# Patient Record
Sex: Male | Born: 1945 | Race: White | Hispanic: No | Smoking: Former smoker
Health system: Southern US, Community
[De-identification: ages and names within clinical notes are randomized; demographics above are authoritative.]

## PROBLEM LIST (undated history)

## (undated) DIAGNOSIS — I2510 Atherosclerotic heart disease of native coronary artery without angina pectoris: Principal | ICD-10-CM

## (undated) DIAGNOSIS — E785 Hyperlipidemia, unspecified: Secondary | ICD-10-CM

## (undated) DIAGNOSIS — I10 Essential (primary) hypertension: Secondary | ICD-10-CM

## (undated) HISTORY — DX: Atherosclerotic heart disease of native coronary artery without angina pectoris: I25.10

## (undated) HISTORY — PX: HERNIA REPAIR: SHX51

## (undated) HISTORY — DX: Hyperlipidemia, unspecified: E78.5

## (undated) HISTORY — DX: Essential (primary) hypertension: I10

---

## 2004-01-14 ENCOUNTER — Inpatient Hospital Stay (HOSPITAL_COMMUNITY): Admission: EM | Admit: 2004-01-14 | Discharge: 2004-01-20 | Payer: Self-pay | Admitting: *Deleted

## 2004-01-15 HISTORY — PX: CARDIAC CATHETERIZATION: SHX172

## 2004-01-16 HISTORY — PX: CORONARY ARTERY BYPASS GRAFT: SHX141

## 2004-01-29 ENCOUNTER — Encounter: Admission: RE | Admit: 2004-01-29 | Discharge: 2004-01-29 | Payer: Self-pay | Admitting: Surgery

## 2004-02-11 ENCOUNTER — Encounter (HOSPITAL_COMMUNITY): Admission: RE | Admit: 2004-02-11 | Discharge: 2004-05-11 | Payer: Self-pay | Admitting: *Deleted

## 2010-01-16 HISTORY — PX: NM MYOCAR PERF WALL MOTION: HXRAD629

## 2013-02-09 ENCOUNTER — Other Ambulatory Visit: Payer: Self-pay

## 2013-02-09 MED ORDER — BENAZEPRIL-HYDROCHLOROTHIAZIDE 20-12.5 MG PO TABS: 1.0000 | ORAL_TABLET | Freq: Every day | ORAL | Status: DC

## 2013-02-09 NOTE — Telephone Encounter (Signed)
Rx was sent to pharmacy electronically. 

## 2013-02-14 ENCOUNTER — Other Ambulatory Visit: Payer: Self-pay | Admitting: Cardiovascular Disease

## 2013-02-14 NOTE — Telephone Encounter (Signed)
Rx was sent to pharmacy electronically. 

## 2013-06-15 ENCOUNTER — Other Ambulatory Visit: Payer: Self-pay | Admitting: *Deleted

## 2013-06-15 MED ORDER — METOPROLOL SUCCINATE ER 25 MG PO TB24: 25.0000 mg | ORAL_TABLET | Freq: Every day | ORAL | Status: DC

## 2013-06-15 NOTE — Telephone Encounter (Signed)
Rx was sent to pharmacy electronically. 

## 2013-06-22 ENCOUNTER — Other Ambulatory Visit: Payer: Self-pay | Admitting: Cardiovascular Disease

## 2013-06-22 LAB — COMPREHENSIVE METABOLIC PANEL
BUN: 10 mg/dL (ref 6–23)
Calcium: 9.5 mg/dL (ref 8.4–10.5)
Creat: 1.01 mg/dL (ref 0.50–1.35)
Potassium: 4.8 mEq/L (ref 3.5–5.3)
Sodium: 137 mEq/L (ref 135–145)
Total Bilirubin: 0.6 mg/dL (ref 0.3–1.2)

## 2013-06-22 LAB — LIPID PANEL
HDL: 34 mg/dL — ABNORMAL LOW (ref >39)
Total CHOL/HDL Ratio: 3.8 Ratio
VLDL: 19 mg/dL (ref 0–40)

## 2013-06-26 ENCOUNTER — Telehealth: Payer: Self-pay | Admitting: *Deleted

## 2013-06-26 NOTE — Telephone Encounter (Signed)
LM with lab results

## 2013-06-26 NOTE — Telephone Encounter (Signed)
Message copied by Vita Barley on Mon Jun 26, 2013  2:57 PM ------      Message from: [place], [place]      Created: Thu Jun 22, 2013  6:09 PM       Labs look great ------

## 2013-06-30 ENCOUNTER — Ambulatory Visit (INDEPENDENT_AMBULATORY_CARE_PROVIDER_SITE_OTHER): Payer: Medicare Other | Admitting: Cardiovascular Disease

## 2013-06-30 ENCOUNTER — Encounter: Payer: Self-pay | Admitting: Cardiovascular Disease

## 2013-06-30 VITALS — BP 141/83 | HR 50 | Resp 16 | Ht 70.0 in | Wt 208.9 lb

## 2013-06-30 DIAGNOSIS — I2510 Atherosclerotic heart disease of native coronary artery without angina pectoris: Secondary | ICD-10-CM

## 2013-06-30 DIAGNOSIS — I498 Other specified cardiac arrhythmias: Secondary | ICD-10-CM

## 2013-06-30 DIAGNOSIS — Z23 Encounter for immunization: Secondary | ICD-10-CM

## 2013-06-30 DIAGNOSIS — R001 Bradycardia, unspecified: Secondary | ICD-10-CM

## 2013-06-30 DIAGNOSIS — E785 Hyperlipidemia, unspecified: Secondary | ICD-10-CM

## 2013-06-30 DIAGNOSIS — I10 Essential (primary) hypertension: Secondary | ICD-10-CM

## 2013-06-30 NOTE — Patient Instructions (Signed)
Your physician recommends that you schedule a follow-up appointment in: ONE YEAR  Have lab work done in one year prior to your visit (Lipid profile & CMET).

## 2013-07-04 ENCOUNTER — Encounter: Payer: Self-pay | Admitting: Cardiovascular Disease

## 2013-07-04 DIAGNOSIS — I10 Essential (primary) hypertension: Secondary | ICD-10-CM | POA: Insufficient documentation

## 2013-07-04 DIAGNOSIS — R001 Bradycardia, unspecified: Secondary | ICD-10-CM | POA: Insufficient documentation

## 2013-07-04 DIAGNOSIS — E785 Hyperlipidemia, unspecified: Secondary | ICD-10-CM

## 2013-07-04 DIAGNOSIS — I2510 Atherosclerotic heart disease of native coronary artery without angina pectoris: Secondary | ICD-10-CM

## 2013-07-04 HISTORY — DX: Essential (primary) hypertension: I10

## 2013-07-04 HISTORY — DX: Hyperlipidemia, unspecified: E78.5

## 2013-07-04 HISTORY — DX: Atherosclerotic heart disease of native coronary artery without angina pectoris: I25.10

## 2013-07-04 NOTE — Assessment & Plan Note (Signed)
Borderline high on arrival, when I rechecked his blood pressure it was only 135/81 mm Hg. No changes are made to his antihypertensive medications.

## 2013-07-04 NOTE — Assessment & Plan Note (Signed)
Is is a lifelong characteristic for him and as long as he is asymptomatic no changes are necessary in his medications. He should continue taking the beta blocker.

## 2013-07-04 NOTE — Progress Notes (Signed)
Patient ID: [name], male   DOB: 1946-01-16, 67 y.o.   MRN: [id number]      Reason for office visit Followup CAD  Bohdan feels great. He has no complaints of chest pain, shortness of breath, dizziness, palpitations, syncope, edema or focal neurological problems. His son is recently divorced and is now living with him, which is disturbed his daily routine to some degree. He continues to walk frequently and occasionally lifts weights. He is enjoying spending more time with his grandchildren. He has no physical complaints.   Allergies  Allergen Reactions  . Equal [Aspartame] Hives    Current Outpatient Prescriptions  Medication Sig Dispense Refill  . aspirin 81 MG tablet Take 81 mg by mouth daily.      . benazepril-hydrochlorthiazide (LOTENSIN HCT) 20-12.5 MG per tablet Take 1 tablet by mouth daily.  30 tablet  5  . cycloSPORINE (RESTASIS) 0.05 % ophthalmic emulsion Place 1 drop into both eyes 2 (two) times daily.      . metoprolol succinate (TOPROL-XL) 25 MG 24 hr tablet Take 1 tablet (25 mg total) by mouth daily.  30 tablet  1  . NEXIUM 40 MG capsule take 1 capsule by mouth twice a day  60 capsule  4  . omega-3 acid ethyl esters (LOVAZA) 1 G capsule Take 1 g by mouth 3 (three) times daily.      . simvastatin (ZOCOR) 40 MG tablet Take 40 mg by mouth daily.       No current facility-administered medications for this visit.    Past Medical History  Diagnosis Date  . 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 Laneta Simmers  . Hyperlipidemia 07/04/2013  . HTN (hypertension) 07/04/2013    No past surgical history on file.  No family history on file.  History   Social History  . Marital Status: Divorced    Spouse Name: N/A    Number of Children: N/A  . Years of Education: N/A   Occupational History  . Not on file.   Social History Main Topics  . Smoking status: Former Smoker    Quit date:  08/16/2005  . Smokeless tobacco: Not on file  . Alcohol Use: Yes     Comment: rarely  . Drug Use: No  . Sexual Activity: Not on file   Other Topics Concern  . Not on file   Social History Narrative  . No narrative on file    Review of systems: The patient specifically denies any chest pain at rest exertion, dyspnea at rest or with exertion, orthopnea, paroxysmal nocturnal dyspnea, syncope, palpitations, focal neurological deficits, intermittent claudication, lower extremity edema, unexplained weight gain, cough, hemoptysis or wheezing.   PHYSICAL EXAM BP 141/83  Pulse 50  Resp 16  Ht 5\' 10"  (1.778 m)  Wt 208 lb 14.4 oz (94.756 kg)  BMI 29.97 kg/m2  General: Alert, oriented x3, no distress Head: no evidence of trauma, PERRL, EOMI, no exophtalmos or lid lag, no myxedema, no xanthelasma; normal ears, nose and oropharynx Neck: normal jugular venous pulsations and no hepatojugular reflux; brisk carotid pulses without delay and no carotid bruits Chest: clear to auscultation, no signs of consolidation by percussion or palpation, normal fremitus, symmetrical and full respiratory excursions Cardiovascular: normal position and quality of the apical impulse, regular rhythm, normal first and second heart sounds, no murmurs, rubs or gallops Abdomen: no tenderness or distention, no  masses by palpation, no abnormal pulsatility or arterial bruits, normal bowel sounds, no hepatosplenomegaly Extremities: no clubbing, cyanosis or edema; 2+ radial, ulnar and brachial pulses bilaterally; 2+ right femoral, posterior tibial and dorsalis pedis pulses; 2+ left femoral, posterior tibial and dorsalis pedis pulses; no subclavian or femoral bruits Neurological: grossly nonfocal   EKG: Sinus bradycardia, nonspecific bifid T waves in the lateral precordial leads, not much change from previous tracings  Lipid Panel     Component Value Date/Time   CHOL 128 06/22/2013 1027   TRIG 96 06/22/2013 1027   HDL 34*  06/22/2013 1027   CHOLHDL 3.8 06/22/2013 1027   VLDL 19 06/22/2013 1027   LDLCALC 75 06/22/2013 1027    BMET    Component Value Date/Time   NA 137 06/22/2013 1027   K 4.8 06/22/2013 1027   CL 103 06/22/2013 1027   CO2 27 06/22/2013 1027   GLUCOSE 99 06/22/2013 1027   BUN 10 06/22/2013 1027   CREATININE 1.01 06/22/2013 1027   CALCIUM 9.5 06/22/2013 1027     ASSESSMENT AND PLAN CAD s/p CABG 2005 Almost 10 years since bypass surgery he remains completely asymptomatic despite a relatively active lifestyle. He has not managed to make a huge dent in his overweight status but is just under the obese margin. I have encouraged him to avoid simple carbohydrates, starches with high glycemic index and saturated fat and try to increase his intake of protein in "healthy fat". He is encouraged to keep walking and exercising. Otherwise his coronary risk factors appear to be relatively well addressed.  HTN (hypertension) Borderline high on arrival, when I rechecked his blood pressure it was only 135/81 mm Hg. No changes are made to his antihypertensive medications.  Hyperlipidemia With the exception of a slightly low HDL cholesterol, all his other lipid parameters are within the desirable range. Increasing physical activity and avoiding carbohydrates might help his HDL rise a little bit.  Sinus bradycardia Is is a lifelong characteristic for him and as long as he is asymptomatic no changes are necessary in his medications. He should continue taking the beta blocker.   at [facility]: Lambert Rhonda W request we gave him a flu shot today Orders Placed This Encounter  Procedures  . Flu Vaccine QUAD 36+ mos IM  . EKG 12-Lead   Meds ordered this encounter  Medications  . omega-3 acid ethyl esters (LOVAZA) 1 G capsule    Sig: Take 1 g by mouth 3 (three) times daily.  . simvastatin (ZOCOR) 40 MG tablet    Sig: Take 40 mg by mouth daily.  Marland Kitchen aspirin 81 MG tablet    Sig: Take 81 mg by mouth daily.  . cycloSPORINE (RESTASIS) 0.05  % ophthalmic emulsion    Sig: Place 1 drop into both eyes 2 (two) times daily.    Junious Silk, MD, [facility] [phone] office [phone] pager

## 2013-07-04 NOTE — Assessment & Plan Note (Signed)
Almost 10 years since bypass surgery he remains completely asymptomatic despite a relatively active lifestyle. He has not managed to make a huge dent in his overweight status but is just under the obese margin. I have encouraged him to avoid simple carbohydrates, starches with high glycemic index and saturated fat and try to increase his intake of protein in "healthy fat". He is encouraged to keep walking and exercising. Otherwise his coronary risk factors appear to be relatively well addressed.

## 2013-07-04 NOTE — Assessment & Plan Note (Signed)
With the exception of a slightly low HDL cholesterol, all his other lipid parameters are within the desirable range. Increasing physical activity and avoiding carbohydrates might help his HDL rise a little bit.

## 2013-07-10 ENCOUNTER — Other Ambulatory Visit: Payer: Self-pay | Admitting: Cardiovascular Disease

## 2013-07-10 NOTE — Telephone Encounter (Signed)
Rx was sent to pharmacy electronically. 

## 2013-08-14 ENCOUNTER — Other Ambulatory Visit: Payer: Self-pay | Admitting: Cardiovascular Disease

## 2013-08-15 NOTE — Telephone Encounter (Signed)
Rx was sent to pharmacy electronically. 

## 2013-08-23 ENCOUNTER — Other Ambulatory Visit: Payer: Self-pay | Admitting: Cardiovascular Disease

## 2013-08-23 NOTE — Telephone Encounter (Signed)
Rx was sent to pharmacy electronically. 

## 2014-01-16 ENCOUNTER — Other Ambulatory Visit: Payer: Self-pay | Admitting: Cardiovascular Disease

## 2014-01-16 NOTE — Telephone Encounter (Signed)
Rx was sent to pharmacy electronically. 

## 2014-07-16 ENCOUNTER — Other Ambulatory Visit: Payer: Self-pay | Admitting: Cardiovascular Disease

## 2014-07-17 NOTE — Telephone Encounter (Signed)
Rx was sent to pharmacy electronically. 

## 2014-08-12 ENCOUNTER — Other Ambulatory Visit: Payer: Self-pay | Admitting: Cardiovascular Disease

## 2014-08-13 NOTE — Telephone Encounter (Signed)
Rx refill sent to patient pharmacy   

## 2014-08-14 ENCOUNTER — Other Ambulatory Visit: Payer: Self-pay | Admitting: Cardiovascular Disease

## 2014-08-15 NOTE — Telephone Encounter (Signed)
Rx(s) sent to pharmacy electronically. OV 10/03/14

## 2014-08-19 ENCOUNTER — Other Ambulatory Visit: Payer: Self-pay | Admitting: Cardiovascular Disease

## 2014-08-20 NOTE — Telephone Encounter (Signed)
Rx(s) sent to pharmacy electronically. OV 10/03/14

## 2014-10-03 ENCOUNTER — Ambulatory Visit (INDEPENDENT_AMBULATORY_CARE_PROVIDER_SITE_OTHER): Payer: Medicare Other | Admitting: Cardiovascular Disease

## 2014-10-03 ENCOUNTER — Encounter: Payer: Self-pay | Admitting: Cardiovascular Disease

## 2014-10-03 VITALS — BP 128/64 | HR 50 | Ht 70.0 in | Wt 201.3 lb

## 2014-10-03 DIAGNOSIS — I2510 Atherosclerotic heart disease of native coronary artery without angina pectoris: Secondary | ICD-10-CM

## 2014-10-03 DIAGNOSIS — Z79899 Other long term (current) drug therapy: Secondary | ICD-10-CM

## 2014-10-03 DIAGNOSIS — R001 Bradycardia, unspecified: Secondary | ICD-10-CM

## 2014-10-03 DIAGNOSIS — I10 Essential (primary) hypertension: Secondary | ICD-10-CM

## 2014-10-03 DIAGNOSIS — E785 Hyperlipidemia, unspecified: Secondary | ICD-10-CM

## 2014-10-03 NOTE — Patient Instructions (Signed)
Dr.Croitoru  wants you to follow-up in: 1 year You will receive a reminder letter in the mail two months in advance. If you don't receive a letter, please call our office to schedule the follow-up appointment.  Your physician recommends that you return for lab work FASTING  Your physician has requested that you have an exercise tolerance test. For further information please visit [email or web address]. Please also follow instruction sheet, as given.

## 2014-10-03 NOTE — Progress Notes (Signed)
Patient ID: [name] SEEPERSAD, male   DOB: 03/10/1946, 69 y.o.   MRN: [id number]     Reason for office visit CAD, hyperlipidemia, hypertension  Mr. [name] is now over 10 years s/p CABG for multivessel CAD. He remains asymptomatic. He walks daily in his pain more attention to his diet. He has lost roughly 10 pounds in the last couple of years. He remains moderate 3 overweight. He has long-standing mild and asymptomatic sinus bradycardia. He has treated hypertension and hyperlipidemia. He does not smoke. Pre-bypass angina manifested as left arm pain   Allergies  Allergen Reactions  . Equal [Aspartame] Hives  . Wellbutrin [Bupropion]     Current Outpatient Prescriptions  Medication Sig Dispense Refill  . aspirin 325 MG tablet Take 325 mg by mouth daily.    . benazepril-hydrochlorthiazide (LOTENSIN HCT) 20-12.5 MG per tablet TAKE 1 TABLET BY MOUTH EVERY DAY 30 tablet 1  . cycloSPORINE (RESTASIS) 0.05 % ophthalmic emulsion Place 1 drop into both eyes 2 (two) times daily.    . metoprolol succinate (TOPROL-XL) 25 MG 24 hr tablet take 1 tablet by mouth daily 30 tablet 1  . NEXIUM 40 MG capsule take 1 capsule by mouth twice a day 60 capsule 4  . omega-3 acid ethyl esters (LOVAZA) 1 G capsule Take 1 g by mouth 3 (three) times daily.    . simvastatin (ZOCOR) 40 MG tablet take 1 tablet by mouth at bedtime 30 tablet 1   No current facility-administered medications for this visit.    Past Medical History  Diagnosis Date  . 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 Laneta Simmers  . Hyperlipidemia 07/04/2013  . HTN (hypertension) 07/04/2013    Past Surgical History  Procedure Laterality Date  . Nm myocar perf wall motion  01/16/2010    EF 65%,Mets 10  . Cardiac catheterization  01/15/2004  . Coronary artery bypass graft  01/16/2004    CABG's x4    No family history on file.  History   Social History  . Marital Status:  Divorced    Spouse Name: N/A  . Number of Children: N/A  . Years of Education: N/A   Occupational History  . Not on file.   Social History Main Topics  . Smoking status: Former Smoker    Quit date: 08/16/2005  . Smokeless tobacco: Not on file  . Alcohol Use: Yes     Comment: rarely  . Drug Use: No  . Sexual Activity: Not on file   Other Topics Concern  . Not on file   Social History Narrative    Review of systems: The patient specifically denies any chest pain at rest or with exertion, dyspnea at rest or with exertion, orthopnea, paroxysmal nocturnal dyspnea, syncope, palpitations, focal neurological deficits, intermittent claudication, lower extremity edema, unexplained weight gain, cough, hemoptysis or wheezing.  The patient also denies abdominal pain, nausea, vomiting, dysphagia, diarrhea, constipation, polyuria, polydipsia, dysuria, hematuria, frequency, urgency, abnormal bleeding or bruising, fever, chills, unexpected weight changes, mood swings, change in skin or hair texture, change in voice quality, auditory or visual problems, allergic reactions or rashes, new musculoskeletal complaints other than usual "aches and pains".   PHYSICAL EXAM BP 128/64 mmHg  Pulse 50  Ht 5\' 10"  (1.778 m)  Wt 91.309 kg (201 lb 4.8 oz)  BMI 28.88 kg/m2  General: Alert, oriented x3, no distress Head: no evidence of trauma,  PERRL, EOMI, no exophtalmos or lid lag, no myxedema, no xanthelasma; normal ears, nose and oropharynx Neck: normal jugular venous pulsations and no hepatojugular reflux; brisk carotid pulses without delay and no carotid bruits Chest: clear to auscultation, no signs of consolidation by percussion or palpation, normal fremitus, symmetrical and full respiratory excursions, sternotomy scar Cardiovascular: normal position and quality of the apical impulse, regular rhythm, normal first and second heart sounds, no murmurs, rubs or gallops Abdomen: no tenderness or distention, no  masses by palpation, no abnormal pulsatility or arterial bruits, normal bowel sounds, no hepatosplenomegaly Extremities: no clubbing, cyanosis or edema; 2+ radial, ulnar and brachial pulses bilaterally; 2+ right femoral, posterior tibial and dorsalis pedis pulses; 2+ left femoral, posterior tibial and dorsalis pedis pulses; no subclavian or femoral bruits Neurological: grossly nonfocal   EKG: Sinus bradycardia, nonspecific T-wave flattening  Lipid Panel     Component Value Date/Time   CHOL 128 06/22/2013 1027   TRIG 96 06/22/2013 1027   HDL 34* 06/22/2013 1027   CHOLHDL 3.8 06/22/2013 1027   VLDL 19 06/22/2013 1027   LDLCALC 75 06/22/2013 1027    BMET    Component Value Date/Time   NA 137 06/22/2013 1027   K 4.8 06/22/2013 1027   CL 103 06/22/2013 1027   CO2 27 06/22/2013 1027   GLUCOSE 99 06/22/2013 1027   BUN 10 06/22/2013 1027   CREATININE 1.01 06/22/2013 1027   CALCIUM 9.5 06/22/2013 1027     ASSESSMENT AND PLAN  [name] is doing very well 10 years after bypass surgery. I recommended a plain treadmill ECG stress test since it has been about 5 years since his last functional study. All his coronary risk factors seem to be well addressed with the exception of the fact that he remains mildly overweight. He is working on losing pounds gradually. Also time to repeat a lipid profile and routine labs. Will follow-up on a yearly basis.  Orders Placed This Encounter  Procedures  . EKG 12-Lead   Meds ordered this encounter  Medications  . aspirin 325 MG tablet    Sig: Take 325 mg by mouth daily.    Junious Silk, MD, [facility] [phone] office [phone] pager

## 2014-10-10 ENCOUNTER — Other Ambulatory Visit: Payer: Self-pay | Admitting: Cardiovascular Disease

## 2014-10-11 NOTE — Telephone Encounter (Signed)
Rx(s) sent to pharmacy electronically.  

## 2014-10-15 ENCOUNTER — Other Ambulatory Visit: Payer: Self-pay | Admitting: Cardiovascular Disease

## 2014-10-15 NOTE — Telephone Encounter (Signed)
Rx(s) sent to pharmacy electronically.  

## 2014-10-16 ENCOUNTER — Other Ambulatory Visit: Payer: Self-pay | Admitting: Cardiovascular Disease

## 2014-10-17 NOTE — Telephone Encounter (Signed)
Rx(s) sent to pharmacy electronically.  

## 2014-10-19 LAB — COMPREHENSIVE METABOLIC PANEL
ALK PHOS: 76 U/L (ref 39–117)
ALT: 19 U/L (ref 0–53)
AST: 22 U/L (ref 0–37)
Albumin: 4.3 g/dL (ref 3.5–5.2)
BILIRUBIN TOTAL: 0.8 mg/dL (ref 0.2–1.2)
BUN: 11 mg/dL (ref 6–23)
CHLORIDE: 101 meq/L (ref 96–112)
CO2: 27 meq/L (ref 19–32)
CREATININE: 0.97 mg/dL (ref 0.50–1.35)
Calcium: 9.3 mg/dL (ref 8.4–10.5)
GLUCOSE: 99 mg/dL (ref 70–99)
Potassium: 4.1 mEq/L (ref 3.5–5.3)
Sodium: 138 mEq/L (ref 135–145)
TOTAL PROTEIN: 7 g/dL (ref 6.0–8.3)

## 2014-10-19 LAB — LIPID PANEL
CHOL/HDL RATIO: 3 ratio
CHOLESTEROL: 128 mg/dL (ref 0–200)
HDL: 42 mg/dL (ref >=40)
LDL Cholesterol: 70 mg/dL (ref 0–99)
TRIGLYCERIDES: 79 mg/dL (ref <150)
VLDL: 16 mg/dL (ref 0–40)

## 2014-10-23 ENCOUNTER — Telehealth (HOSPITAL_COMMUNITY): Payer: Self-pay

## 2014-10-23 NOTE — Telephone Encounter (Signed)
Encounter complete. 

## 2014-10-24 ENCOUNTER — Telehealth (HOSPITAL_COMMUNITY): Payer: Self-pay

## 2014-10-24 NOTE — Telephone Encounter (Signed)
Encounter complete. 

## 2014-10-25 ENCOUNTER — Ambulatory Visit (HOSPITAL_COMMUNITY)
Admission: RE | Admit: 2014-10-25 | Discharge: 2014-10-25 | Disposition: A | Discharge status: Home / Self Care | Payer: Medicare Other | Source: Ambulatory Visit | Attending: Cardiology | Admitting: Cardiology

## 2014-10-25 DIAGNOSIS — I2510 Atherosclerotic heart disease of native coronary artery without angina pectoris: Secondary | ICD-10-CM | POA: Insufficient documentation

## 2014-10-25 NOTE — Procedures (Signed)
Exercise Treadmill Test   Test  Exercise Tolerance Test Ordering MD: Jerilynn Mages. Croitoru, MD  Interpreting MD:   Unique Test No: 1 Treadmill:  1  Indication for ETT: Follow-Up CAD  Contraindication to ETT: No   Stress Modality: exercise - treadmill  Cardiac Imaging Performed: non   Protocol: standard Bruce - maximal  Max BP:  204/110  Max MPHR (bpm):  154 85% MPR (bpm):  131  MPHR obtained (bpm):  137 % MPHR obtained:  88  Reached 85% MPHR (min:sec):  7:00 Total Exercise Time (min-sec):  8:00  Workload in METS:  10.10 Borg Scale:   Reason ETT Terminated:  dyspnea    ST Segment Analysis At Rest: normal ST segments - no evidence of significant ST depression With Exercise: significant ischemic ST depression  Other Information Arrhythmia:  No Angina during ETT:  absent (0) Quality of ETT:  diagnostic  ETT Interpretation:  abnormal - evidence of ST depression consistent with ischemia  Comments: 3-4 mm horizontal to downsloping ST segments inferiorly with T wave inversions Good exercise tolerance Marked hypertensive response to exercise No chest pain  Pixie Casino, MD, [facility] Attending Cardiologist [place]

## 2015-01-02 ENCOUNTER — Encounter: Payer: Self-pay | Admitting: *Deleted

## 2015-02-11 ENCOUNTER — Other Ambulatory Visit: Payer: Self-pay

## 2015-05-20 ENCOUNTER — Encounter: Payer: Self-pay | Admitting: Family Medicine

## 2015-10-06 ENCOUNTER — Other Ambulatory Visit: Payer: Self-pay | Admitting: Cardiovascular Disease

## 2015-10-07 NOTE — Telephone Encounter (Signed)
Rx(s) sent to pharmacy electronically.  

## 2015-10-10 ENCOUNTER — Other Ambulatory Visit: Payer: Self-pay | Admitting: Cardiovascular Disease

## 2015-10-10 NOTE — Telephone Encounter (Signed)
Rx(s) sent to pharmacy electronically.  

## 2015-10-18 ENCOUNTER — Other Ambulatory Visit: Payer: Self-pay | Admitting: Cardiovascular Disease

## 2015-11-04 ENCOUNTER — Other Ambulatory Visit: Payer: Self-pay | Admitting: Cardiovascular Disease

## 2015-11-04 NOTE — Telephone Encounter (Signed)
Rx(s) sent to pharmacy electronically.  

## 2015-11-11 ENCOUNTER — Other Ambulatory Visit: Payer: Self-pay | Admitting: Cardiovascular Disease

## 2015-11-11 NOTE — Telephone Encounter (Signed)
Rx refill sent to pharmacy. 

## 2015-11-18 ENCOUNTER — Other Ambulatory Visit: Payer: Self-pay | Admitting: Cardiovascular Disease

## 2015-11-19 NOTE — Telephone Encounter (Signed)
Rx(s) sent to pharmacy electronically.  

## 2015-12-10 ENCOUNTER — Other Ambulatory Visit: Payer: Self-pay | Admitting: Cardiovascular Disease

## 2015-12-10 NOTE — Telephone Encounter (Signed)
Rx(s) sent to pharmacy electronically.  

## 2015-12-17 ENCOUNTER — Other Ambulatory Visit: Payer: Self-pay | Admitting: Cardiovascular Disease

## 2015-12-17 NOTE — Telephone Encounter (Signed)
REFILL 

## 2015-12-18 ENCOUNTER — Ambulatory Visit (INDEPENDENT_AMBULATORY_CARE_PROVIDER_SITE_OTHER): Payer: Medicare Other | Admitting: Cardiovascular Disease

## 2015-12-18 ENCOUNTER — Encounter: Payer: Self-pay | Admitting: Cardiovascular Disease

## 2015-12-18 VITALS — BP 107/66 | HR 64 | Ht 70.0 in | Wt 182.0 lb

## 2015-12-18 DIAGNOSIS — Z79899 Other long term (current) drug therapy: Secondary | ICD-10-CM | POA: Diagnosis not present

## 2015-12-18 DIAGNOSIS — I10 Essential (primary) hypertension: Secondary | ICD-10-CM | POA: Diagnosis not present

## 2015-12-18 DIAGNOSIS — E785 Hyperlipidemia, unspecified: Secondary | ICD-10-CM | POA: Diagnosis not present

## 2015-12-18 DIAGNOSIS — I2510 Atherosclerotic heart disease of native coronary artery without angina pectoris: Secondary | ICD-10-CM | POA: Diagnosis not present

## 2015-12-18 MED ORDER — BENAZEPRIL-HYDROCHLOROTHIAZIDE 20-12.5 MG PO TABS: 0.5000 | ORAL_TABLET | Freq: Every day | ORAL | Status: DC

## 2015-12-18 NOTE — Progress Notes (Signed)
Patient ID: [name], male   DOB: 09/22/1945, 71 y.o.   MRN: [id number]    Cardiology Office Note    Date:  12/18/2015   ID:  [name] [birth year], MRN [id number]  PCP:  Lucilla Edin, MD  Cardiologist:   Thurmon Fair, MD   Chief Complaint  Patient presents with  . Follow-up    no Chest pain, no shortness of breath, no edema, no pain or cramping in legs, no lightheaded or dizziness    History of Present Illness:  [name] is a 70 y.o. male who underwent coronary bypass surgery for multivessel CAD 12 years ago. He has treated hyperlipidemia and hypertension. He feels great. In the last 3 years he has lost 28 pounds, 18 of those just in the last 12 months. He is simply watching his food intake more carefully. He continues to walk regularly. He denies complaints of angina pectoris (left arm pain before bypass surgery), dyspnea, edema, or claudication. He has not had syncope, dizziness, palpitations or lightheadedness. His blood pressure is substantially lower than it was in the past. He has long-standing erectile dysfunction. He has a "false positive" ECG treadmill response, but had a normal nuclear stress test in 2011. A year ago, his exercise capacity was identical to 2011 (8 minutes on the Bruce protocol).   Past Medical History  Diagnosis Date  . 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 Laneta Simmers  . Hyperlipidemia 07/04/2013  . HTN (hypertension) 07/04/2013    Past Surgical History  Procedure Laterality Date  . Nm myocar perf wall motion  01/16/2010    EF 65%,Mets 10  . Cardiac catheterization  01/15/2004  . Coronary artery bypass graft  01/16/2004    CABG's x4    Current Medications: Outpatient Prescriptions Prior to Visit  Medication Sig Dispense Refill  . aspirin 325 MG tablet Take 325 mg by mouth daily.    . cycloSPORINE (RESTASIS) 0.05 % ophthalmic emulsion Place 1 drop into both  eyes 2 (two) times daily.    . metoprolol succinate (TOPROL-XL) 25 MG 24 hr tablet Take 1 tablet (25 mg total) by mouth daily. KEEP OV. 30 tablet 0  . NEXIUM 40 MG capsule take 1 capsule by mouth twice a day 60 capsule 4  . omega-3 acid ethyl esters (LOVAZA) 1 G capsule Take 1 g by mouth 3 (three) times daily.    . simvastatin (ZOCOR) 40 MG tablet Take 1 tablet (40 mg total) by mouth at bedtime. KEEP OV. 30 tablet 0  . benazepril-hydrochlorthiazide (LOTENSIN HCT) 20-12.5 MG tablet TAKE 1 TABLET BY MOUTH EVERY DAY 30 tablet 0   No facility-administered medications prior to visit.     Allergies:   Equal and Wellbutrin   Social History   Social History  . Marital Status: Divorced    Spouse Name: N/A  . Number of Children: N/A  . Years of Education: N/A   Social History Main Topics  . Smoking status: Former Smoker    Quit date: 08/16/2005  . Smokeless tobacco: None  . Alcohol Use: Yes     Comment: rarely  . Drug Use: No  . Sexual Activity: Not Asked   Other Topics Concern  . None   Social History Narrative      ROS:   Please see the history of present illness.    ROS All other systems reviewed  and are negative.   PHYSICAL EXAM:   VS:  BP 107/66 mmHg  Pulse 64  Ht 5\' 10"  (1.778 m)  Wt 82.555 kg (182 lb)  BMI 26.11 kg/m2   GEN: Well nourished, well developed, in no acute distress HEENT: normal Neck: no JVD, carotid bruits, or masses Cardiac: RRR; no murmurs, rubs, or gallops,no edema  Respiratory:  clear to auscultation bilaterally, normal work of breathing GI: soft, nontender, nondistended, + BS MS: no deformity or atrophy Skin: warm and dry, no rash Neuro:  Alert and Oriented x 3, Strength and sensation are intact Psych: euthymic mood, full affect  Wt Readings from Last 3 Encounters:  12/18/15 82.555 kg (182 lb)  10/03/14 91.309 kg (201 lb 4.8 oz)  06/30/13 94.756 kg (208 lb 14.4 oz)      Studies/Labs Reviewed:   EKG:  EKG is ordered today.  The ekg  ordered today demonstrates Sinus rhythm with nonspecific generalized T-wave abnormalities  Recent Labs: No results found for requested labs within last 365 days.   Lipid Panel    Component Value Date/Time   CHOL 128 10/18/2014 1033   TRIG 79 10/18/2014 1033   HDL 42 10/18/2014 1033   CHOLHDL 3.0 10/18/2014 1033   VLDL 16 10/18/2014 1033   LDLCALC 70 10/18/2014 1033     ASSESSMENT:    1. Coronary artery disease involving native coronary artery of native heart without angina pectoris   2. Essential hypertension   3. Hyperlipidemia   4. Medication management      PLAN:  In order of problems listed above:  1. CAD: Asymptomatic, risk factors well addressed. Congratulated on weight loss. Very close to target weight range. 2. HTN: Blood pressure now approaching low normal range. We'll cut back the dose of benazepril-hydrochlorothiazide in half 3. HLP: Recheck lipid profile. Suspected will be even better than last year.    Medication Adjustments/Labs and Tests Ordered: Current medicines are reviewed at length with the patient today.  Concerns regarding medicines are outlined above.  Medication changes, Labs and Tests ordered today are listed in the Patient Instructions below. Patient Instructions  Medication Instructions: Dr Royann Shivers has recommended making the following medication changes: 1. DECREASE Benazepril-HCT - take 0.5 tablet daily  Labwork: Your physician recommends that you return for lab work at your earliest convenience - FASTING.  Testing/Procedures: NONE  Follow-up: Dr Royann Shivers recommends that you schedule a follow-up appointment in 1 year. You will receive a reminder letter in the mail two months in advance. If you don't receive a letter, please call our office to schedule the follow-up appointment.  If you need a refill on your cardiac medications before your next appointment, please call your pharmacy.     Joie Bimler, MD  12/18/2015 2:28 PM     [facility] [address] [place], [place], [place]  [address] Phone: [phone]; Fax: [phone]

## 2015-12-18 NOTE — Patient Instructions (Signed)
Medication Instructions: Dr Sallyanne Kuster has recommended making the following medication changes: 1. DECREASE Benazepril-HCT - take 0.5 tablet daily  Labwork: Your physician recommends that you return for lab work at your earliest [place].  Testing/Procedures: NONE  Follow-up: Dr Sallyanne Kuster recommends that you schedule a follow-up appointment in 1 year. You will receive a reminder letter in the mail two months in advance. If you don't receive a letter, please call our office to schedule the follow-up appointment.  If you need a refill on your cardiac medications before your next appointment, please call your pharmacy.

## 2015-12-30 NOTE — Addendum Note (Signed)
Addended by: Therisa Doyne on: 12/30/2015 04:27 PM   Modules accepted: Orders

## 2016-01-09 ENCOUNTER — Other Ambulatory Visit: Payer: Self-pay | Admitting: Cardiovascular Disease

## 2016-01-09 NOTE — Telephone Encounter (Signed)
Rx(s) sent to pharmacy electronically.  

## 2016-01-16 ENCOUNTER — Other Ambulatory Visit: Payer: Self-pay | Admitting: Cardiovascular Disease

## 2016-01-16 NOTE — Telephone Encounter (Signed)
Rx(s) sent to pharmacy electronically.  

## 2016-02-13 ENCOUNTER — Other Ambulatory Visit: Payer: Self-pay | Admitting: Cardiovascular Disease

## 2016-02-13 NOTE — Telephone Encounter (Signed)
Rx(s) sent to pharmacy electronically.  

## 2016-03-18 ENCOUNTER — Ambulatory Visit (INDEPENDENT_AMBULATORY_CARE_PROVIDER_SITE_OTHER): Payer: Medicare Other | Admitting: Emergency Medicine

## 2016-03-18 ENCOUNTER — Ambulatory Visit (INDEPENDENT_AMBULATORY_CARE_PROVIDER_SITE_OTHER): Payer: Medicare Other

## 2016-03-18 VITALS — BP 126/60 | HR 58 | Temp 98.1°F | Resp 18 | Ht 70.0 in | Wt 172.8 lb

## 2016-03-18 DIAGNOSIS — J0101 Acute recurrent maxillary sinusitis: Secondary | ICD-10-CM | POA: Diagnosis not present

## 2016-03-18 MED ORDER — CEFDINIR 300 MG PO CAPS: ORAL_CAPSULE | ORAL | 0 refills | Status: DC

## 2016-03-18 NOTE — Progress Notes (Signed)
Patient ID: [name], male   DOB: 01/15/1946, 70 y.o.   MRN: [id number]    By signing my name below, I, [name], attest that this documentation has been prepared under the direction and in the presence of Collene Gobble, MD Electronically Signed: Charline Bills, ED Scribe 03/18/2016 at 12:52 PM.  Chief Complaint:  Chief Complaint  Patient presents with   Facial Pain    x 2 months   head congestion   HPI: [name] is a 70 y.o. male who reports to [facility] today complaining of ongoing sinus pressure for the past 2 month. Pt reports associated symptoms of nasal congestion, green-thickened mucus, loss of appetite, chronic dry eyes that he treats with restasis. Pt states that symptoms started as cold-like symptoms 4 week ago. He was evaluated at [facility] 2 weeks ago and given Augmentin x5 days without relief and then was given Septra which he completed with his last dose being 3 days ago. Pt states that he used to get 3-4 sinus infections annually but this has improved significantly since he uses nasal saline daily. Pt is a former smoker.   Past Medical History:  Diagnosis Date   CAD s/p CABG 2005 07/04/2013   2005 LIMA to LAD, free radial to ramus intermedius, sequential SVG to PDA and distal RCA Dr. Laneta Simmers   HTN (hypertension) 07/04/2013   Hyperlipidemia 07/04/2013   Past Surgical History:  Procedure Laterality Date   CARDIAC CATHETERIZATION  01/15/2004   CORONARY ARTERY BYPASS GRAFT  01/16/2004   CABG's x4   NM MYOCAR PERF WALL MOTION  01/16/2010   EF 65%,Mets 10   Social History   Social History   Marital status: Divorced    Spouse name: N/A   Number of children: N/A   Years of education: N/A   Social History Main Topics   Smoking status: Former Smoker    Quit date: 08/16/2005   Smokeless tobacco: None   Alcohol use Yes     Comment: rarely   Drug use: No   Sexual activity: Not Asked   Other Topics Concern   None   Social History Narrative     None   No family history on file. Allergies  Allergen Reactions   Equal [Aspartame] Hives   Wellbutrin [Bupropion]    Prior to Admission medications   Medication Sig Start Date End Date Taking? Authorizing Provider  aspirin 325 MG tablet Take 325 mg by mouth daily.   Yes Historical Provider, MD  benazepril-hydrochlorthiazide (LOTENSIN HCT) 20-12.5 MG tablet TAKE 1 TABLET BY MOUTH EVERY DAY 01/09/16  Yes Mihai Croitoru, MD  cycloSPORINE (RESTASIS) 0.05 % ophthalmic emulsion Place 1 drop into both eyes 2 (two) times daily.   Yes Historical Provider, MD  metoprolol succinate (TOPROL-XL) 25 MG 24 hr tablet take 1 tablet by mouth daily 02/13/16  Yes Mihai Croitoru, MD  NEXIUM 40 MG capsule take 1 capsule by mouth twice a day 02/14/13  Yes Mihai Croitoru, MD  omega-3 acid ethyl esters (LOVAZA) 1 G capsule Take 1 g by mouth 3 (three) times daily. 06/19/13  Yes Historical Provider, MD  simvastatin (ZOCOR) 40 MG tablet take 1 tablet by mouth at bedtime 01/16/16  Yes Mihai Croitoru, MD   ROS: The patient denies fevers, chills, night sweats, unintentional weight loss, chest pain, palpitations, wheezing, dyspnea on exertion, nausea, vomiting, abdominal pain, dysuria, hematuria, melena, numbness, weakness, or tingling.  All other systems have been reviewed and were otherwise negative with  the exception of those mentioned in the HPI and as above.    PHYSICAL EXAM: Vitals:   03/18/16 1110  BP: 126/60  Pulse: (!) 58  Resp: 18  Temp: 98.1 F (36.7 C)   Body mass index is 24.79 kg/m.  General: Alert, no acute distress HEENT:  Normocephalic, atraumatic, oropharynx patent. TMs are clear. L nare: no obstruction seen. On the R side there is a mucopurulent crusted mass in R nasopharynx.  [facility]: [facility] Cardiovascular: Regular rate and rhythm, no rubs murmurs or gallops. No Carotid bruits, radial pulse intact. No pedal edema.  Respiratory: Clear to auscultation bilaterally. No wheezes, rales, or  rhonchi. No cyanosis, no use of accessory musculature Abdominal: No organomegaly, abdomen is soft and non-tender, positive bowel sounds. No masses. Musculoskeletal: Gait intact. No edema, tenderness Skin: No rashes. Neurologic: Facial musculature symmetric. Psychiatric: Patient acts appropriately throughout our interaction. Lymphatic: No cervical or submandibular lymphadenopathy Meds ordered this encounter  Medications   cefdinir (OMNICEF) 300 MG capsule    Sig: One twice a day    Dispense:  20 capsule    Refill:  0   LABS: EKG/XRAY:      Primary read interpreted by Dr. Cleta Alberts at [facility]. Dg Sinus 1-2 Views  Result Date: 03/18/2016 CLINICAL DATA:  Recurrent maxillary sinusitis. EXAM: PARANASAL SINUSES - 1-2 VIEW COMPARISON:  None. FINDINGS: Single [place] view of paranasal sinuses demonstrates well aerated maxillary sinuses, ethmoid air cells and frontal sinuses. No evidence of mucous retention cyst, polyp or air-fluid levels. Bony nasal septum midline. IMPRESSION: No evidence of sinusitis. Electronically Signed   By: Hulan Saas M.D.   On: 03/18/2016 12:40   ASSESSMENT/PLAN: No evidence of sinusitis on his films. I did place him on Omnicef 300 twice a day. Follow-up made with ENT. 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  Gross sideeffects, risk and benefits, and alternatives of medications d/w patient. Patient is aware that all medications have potential sideeffects and we are unable to predict every sideeffect or drug-drug interaction that may occur.  Lesle Chris MD 03/18/2016 12:08 PM

## 2016-03-18 NOTE — Patient Instructions (Addendum)
   IF you received an x-ray today, you will receive an invoice from [facility]. Please contact [facility] at [phone] with questions or concerns regarding your invoice.   IF you received labwork today, you will receive an invoice from [facility]. Please contact [facility] at [phone] with questions or concerns regarding your invoice.   Our billing staff will not be able to assist you with questions regarding bills from these companies.  You will be contacted with the lab results as soon as they are available. The fastest way to get your results is to activate your My Chart account. Instructions are located on the last page of this paperwork. If you have not heard from us regarding the results in 2 weeks, please contact this office.     Sinusitis, Adult Sinusitis is redness, soreness, and inflammation of the paranasal sinuses. Paranasal sinuses are air pockets within the bones of your face. They are located beneath your eyes, in the middle of your forehead, and above your eyes. In healthy paranasal sinuses, mucus is able to drain out, and air is able to circulate through them by way of your nose. However, when your paranasal sinuses are inflamed, mucus and air can become trapped. This can allow bacteria and other germs to grow and cause infection. Sinusitis can develop quickly and last only a short time (acute) or continue over a long period (chronic). Sinusitis that lasts for more than 12 weeks is considered chronic. CAUSES Causes of sinusitis include:  Allergies.  Structural abnormalities, such as displacement of the cartilage that separates your nostrils (deviated septum), which can decrease the air flow through your nose and sinuses and affect sinus drainage.  Functional abnormalities, such as when the small hairs (cilia) that line your sinuses and help remove mucus do not work properly or are not present. SIGNS AND SYMPTOMS Symptoms  of acute and chronic sinusitis are the same. The primary symptoms are pain and pressure around the affected sinuses. Other symptoms include:  Upper toothache.  Earache.  Headache.  Bad breath.  Decreased sense of smell and taste.  A cough, which worsens when you are lying flat.  Fatigue.  Fever.  Thick drainage from your nose, which often is green and may contain pus (purulent).  Swelling and warmth over the affected sinuses. DIAGNOSIS Your health care provider will perform a physical exam. During your exam, your health care provider may perform any of the following to help determine if you have acute sinusitis or chronic sinusitis:  Look in your nose for signs of abnormal growths in your nostrils (nasal polyps).  Tap over the affected sinus to check for signs of infection.  View the inside of your sinuses using an imaging device that has a light attached (endoscope). If your health care provider suspects that you have chronic sinusitis, one or more of the following tests may be recommended:  Allergy tests.  Nasal culture. A sample of mucus is taken from your nose, sent to a lab, and screened for bacteria.  Nasal cytology. A sample of mucus is taken from your nose and examined by your health care provider to determine if your sinusitis is related to an allergy. TREATMENT Most cases of acute sinusitis are related to a viral infection and will resolve on their own within 10 days. Sometimes, medicines are prescribed to help relieve symptoms of both acute and chronic sinusitis. These may include pain medicines, decongestants, nasal steroid sprays, or saline sprays. However, for sinusitis related   to a bacterial infection, your health care provider will prescribe antibiotic medicines. These are medicines that will help kill the bacteria causing the infection. Rarely, sinusitis is caused by a fungal infection. In these cases, your health care provider will prescribe antifungal  medicine. For some cases of chronic sinusitis, surgery is needed. Generally, these are cases in which sinusitis recurs more than 3 times per year, despite other treatments. HOME CARE INSTRUCTIONS  Drink plenty of water. Water helps thin the mucus so your sinuses can drain more easily.  Use a humidifier.  Inhale steam 3-4 times a day (for example, sit in the bathroom with the shower running).  Apply a warm, moist washcloth to your face 3-4 times a day, or as directed by your health care provider.  Use saline nasal sprays to help moisten and clean your sinuses.  Take medicines only as directed by your health care provider.  If you were prescribed either an antibiotic or antifungal medicine, finish it all even if you start to feel better. SEEK IMMEDIATE MEDICAL CARE IF:  You have increasing pain or severe headaches.  You have nausea, vomiting, or drowsiness.  You have swelling around your face.  You have vision problems.  You have a stiff neck.  You have difficulty breathing.   This information is not intended to replace advice given to you by your health care provider. Make sure you discuss any questions you have with your health care provider.   Document Released: 08/03/2005 Document Revised: 08/24/2014 Document Reviewed: 08/18/2011 Elsevier Interactive Patient Education 2016 Elsevier Inc.  

## 2016-12-07 ENCOUNTER — Other Ambulatory Visit: Payer: Self-pay | Admitting: Cardiovascular Disease

## 2016-12-07 NOTE — Telephone Encounter (Signed)
Rx(s) sent to pharmacy electronically.  

## 2016-12-28 DIAGNOSIS — H25012 Cortical age-related cataract, left eye: Secondary | ICD-10-CM | POA: Diagnosis not present

## 2016-12-28 DIAGNOSIS — H25043 Posterior subcapsular polar age-related cataract, bilateral: Secondary | ICD-10-CM | POA: Diagnosis not present

## 2016-12-28 DIAGNOSIS — H2513 Age-related nuclear cataract, bilateral: Secondary | ICD-10-CM | POA: Diagnosis not present

## 2017-02-03 ENCOUNTER — Other Ambulatory Visit: Payer: Self-pay | Admitting: Cardiovascular Disease

## 2017-02-16 ENCOUNTER — Other Ambulatory Visit: Payer: Self-pay | Admitting: Cardiovascular Disease

## 2017-02-26 ENCOUNTER — Other Ambulatory Visit: Payer: Self-pay | Admitting: Cardiovascular Disease

## 2017-03-06 ENCOUNTER — Other Ambulatory Visit: Payer: Self-pay | Admitting: Cardiovascular Disease

## 2017-03-08 NOTE — Telephone Encounter (Signed)
REFILL 

## 2017-04-05 ENCOUNTER — Other Ambulatory Visit: Payer: Self-pay | Admitting: Cardiovascular Disease

## 2017-04-12 ENCOUNTER — Other Ambulatory Visit: Payer: Self-pay | Admitting: Cardiovascular Disease

## 2017-04-19 ENCOUNTER — Other Ambulatory Visit: Payer: Self-pay | Admitting: Cardiovascular Disease

## 2017-04-20 ENCOUNTER — Other Ambulatory Visit: Payer: Self-pay | Admitting: Cardiovascular Disease

## 2017-04-20 NOTE — Telephone Encounter (Signed)
REFILL 

## 2017-04-20 NOTE — Telephone Encounter (Signed)
Med already refilled 04/20/17. 90 day supply refused as patient needs MD OV

## 2017-05-02 ENCOUNTER — Other Ambulatory Visit: Payer: Self-pay | Admitting: Cardiovascular Disease

## 2017-05-03 NOTE — Telephone Encounter (Signed)
Rx(s) sent to pharmacy electronically.  

## 2017-05-13 ENCOUNTER — Other Ambulatory Visit: Payer: Self-pay | Admitting: Cardiovascular Disease

## 2017-05-20 ENCOUNTER — Other Ambulatory Visit: Payer: Self-pay | Admitting: Cardiovascular Disease

## 2017-05-21 NOTE — Telephone Encounter (Signed)
REFILL 

## 2017-05-23 ENCOUNTER — Other Ambulatory Visit: Payer: Self-pay | Admitting: Cardiovascular Disease

## 2017-06-03 ENCOUNTER — Other Ambulatory Visit: Payer: Self-pay | Admitting: Cardiovascular Disease

## 2017-06-03 NOTE — Telephone Encounter (Signed)
REFILL 

## 2017-06-12 ENCOUNTER — Other Ambulatory Visit: Payer: Self-pay | Admitting: Cardiovascular Disease

## 2017-06-22 ENCOUNTER — Other Ambulatory Visit: Payer: Self-pay | Admitting: Cardiovascular Disease

## 2017-06-22 NOTE — Telephone Encounter (Signed)
REFILL 

## 2017-06-24 ENCOUNTER — Other Ambulatory Visit: Payer: Self-pay | Admitting: Cardiovascular Disease

## 2017-06-28 ENCOUNTER — Telehealth: Payer: Self-pay | Admitting: Cardiovascular Disease

## 2017-06-28 MED ORDER — METOPROLOL SUCCINATE ER 25 MG PO TB24: 25.0000 mg | ORAL_TABLET | Freq: Every day | ORAL | 4 refills | Status: DC

## 2017-06-28 MED ORDER — SIMVASTATIN 40 MG PO TABS: ORAL_TABLET | ORAL | 4 refills | Status: DC

## 2017-06-28 NOTE — Telephone Encounter (Signed)
Message has been left that the prescriptions have been refilled. His follow up appointment is February 2019 with Dr. Sallyanne Kuster.

## 2017-06-28 NOTE — Telephone Encounter (Signed)
° °*  STAT* If patient is at the pharmacy, call can be transferred to refill team.  1. Which medications need to be refilled? (please list name of each medication and dose if known)  metoprolol 25mg  and simvastatin 40mg    2. Which pharmacy/location (including street and city if local pharmacy) is medication to be sent to? [facility] on [address] [phone]  3. Do they need a 30 day or 90 day supply? [place]

## 2017-07-20 ENCOUNTER — Other Ambulatory Visit: Payer: Self-pay | Admitting: Cardiovascular Disease

## 2017-08-18 DIAGNOSIS — L821 Other seborrheic keratosis: Secondary | ICD-10-CM | POA: Diagnosis not present

## 2017-08-18 DIAGNOSIS — D1801 Hemangioma of skin and subcutaneous tissue: Secondary | ICD-10-CM | POA: Diagnosis not present

## 2017-08-18 DIAGNOSIS — C44321 Squamous cell carcinoma of skin of nose: Secondary | ICD-10-CM | POA: Diagnosis not present

## 2017-08-18 DIAGNOSIS — L814 Other melanin hyperpigmentation: Secondary | ICD-10-CM | POA: Diagnosis not present

## 2017-08-18 DIAGNOSIS — D229 Melanocytic nevi, unspecified: Secondary | ICD-10-CM | POA: Diagnosis not present

## 2017-08-18 DIAGNOSIS — D485 Neoplasm of uncertain behavior of skin: Secondary | ICD-10-CM | POA: Diagnosis not present

## 2017-08-26 DIAGNOSIS — C44329 Squamous cell carcinoma of skin of other parts of face: Secondary | ICD-10-CM | POA: Diagnosis not present

## 2017-08-27 DIAGNOSIS — C44321 Squamous cell carcinoma of skin of nose: Secondary | ICD-10-CM | POA: Diagnosis not present

## 2017-08-31 DIAGNOSIS — C4490 Unspecified malignant neoplasm of skin, unspecified: Secondary | ICD-10-CM | POA: Diagnosis not present

## 2017-08-31 DIAGNOSIS — C300 Malignant neoplasm of nasal cavity: Secondary | ICD-10-CM | POA: Diagnosis not present

## 2017-08-31 DIAGNOSIS — C119 Malignant neoplasm of nasopharynx, unspecified: Secondary | ICD-10-CM | POA: Diagnosis not present

## 2017-09-22 DIAGNOSIS — Z7982 Long term (current) use of aspirin: Secondary | ICD-10-CM | POA: Diagnosis not present

## 2017-09-22 DIAGNOSIS — G4730 Sleep apnea, unspecified: Secondary | ICD-10-CM | POA: Diagnosis not present

## 2017-09-22 DIAGNOSIS — I10 Essential (primary) hypertension: Secondary | ICD-10-CM | POA: Diagnosis not present

## 2017-09-22 DIAGNOSIS — C4402 Squamous cell carcinoma of skin of lip: Secondary | ICD-10-CM | POA: Diagnosis not present

## 2017-09-22 DIAGNOSIS — C44329 Squamous cell carcinoma of skin of other parts of face: Secondary | ICD-10-CM | POA: Diagnosis not present

## 2017-09-22 DIAGNOSIS — C44321 Squamous cell carcinoma of skin of nose: Secondary | ICD-10-CM | POA: Diagnosis not present

## 2017-09-22 DIAGNOSIS — K219 Gastro-esophageal reflux disease without esophagitis: Secondary | ICD-10-CM | POA: Diagnosis not present

## 2017-09-22 DIAGNOSIS — C44320 Squamous cell carcinoma of skin of unspecified parts of face: Secondary | ICD-10-CM | POA: Diagnosis not present

## 2017-09-22 DIAGNOSIS — Z87891 Personal history of nicotine dependence: Secondary | ICD-10-CM | POA: Diagnosis not present

## 2017-09-22 DIAGNOSIS — I2510 Atherosclerotic heart disease of native coronary artery without angina pectoris: Secondary | ICD-10-CM | POA: Diagnosis not present

## 2017-10-07 ENCOUNTER — Encounter: Payer: Self-pay | Admitting: Cardiovascular Disease

## 2017-10-07 ENCOUNTER — Ambulatory Visit: Payer: Medicare HMO | Admitting: Cardiovascular Disease

## 2017-10-07 VITALS — BP 132/62 | HR 48 | Ht 70.0 in | Wt 154.4 lb

## 2017-10-07 DIAGNOSIS — E785 Hyperlipidemia, unspecified: Secondary | ICD-10-CM

## 2017-10-07 DIAGNOSIS — I10 Essential (primary) hypertension: Secondary | ICD-10-CM | POA: Diagnosis not present

## 2017-10-07 DIAGNOSIS — I2510 Atherosclerotic heart disease of native coronary artery without angina pectoris: Secondary | ICD-10-CM | POA: Diagnosis not present

## 2017-10-07 NOTE — Patient Instructions (Signed)
Dr Sallyanne Kuster has recommended making the following medication changes: 1. WEAN OFF Metoprolol - take 0.5 tablet for 1 week then STOP  Your physician recommends that you return for lab work at your [place].  Dr Sallyanne Kuster recommends that you schedule a follow-up appointment in 12 months. You will receive a reminder letter in the mail two months in advance. If you don't receive a letter, please call our office to schedule the follow-up appointment.  If you need a refill on your cardiac medications before your next appointment, please call your pharmacy.

## 2017-10-10 NOTE — Progress Notes (Signed)
Patient ID: [name], male   DOB: [birth year], 72 y.o.   MRN: [id number]    Cardiology Office Note    Date:  10/10/2017   ID:  [name], [name] 1946-05-14, MRN [id number]  PCP:  [name] Gobble, MD  Cardiologist:   [name] Fair, MD   Chief Complaint  Patient presents with  . Follow-up    History of Present Illness:  [name] is a 72 y.o. male who underwent coronary bypass surgery for multivessel CAD in 2005. He has treated hyperlipidemia and hypertension.   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  He will also need radiation therapy.  His ENT surgeon is [name]. Madelynn [name] in [place].  He feels well from a cardiac point of view. The patient specifically denies any chest pain at rest exertion, dyspnea at rest or with exertion, orthopnea, paroxysmal nocturnal dyspnea, syncope, palpitations, focal neurological ddeficits, intermittent claudication, lower extremity edema,cough, hemoptysis or wheezing.  Continues to lose weight.  Presented today with a marked sinus bradycardia at 48 bpm.  He is on a low-dose of metoprolol.  He has long-standing erectile dysfunction. He has a "false positive" ECG treadmill response, but had a normal nuclear stress test in 2011. In 2016, his exercise capacity was identical to 2011 (8 minutes on the Bruce protocol).   Past Medical History:  Diagnosis Date  . CAD s/p CABG 2005 07/04/2013   2005 LIMA to LAD, free radial to ramus intermedius, sequential SVG to PDA and distal RCA [name]. Laneta [name]  . HTN (hypertension) 07/04/2013  . Hyperlipidemia 07/04/2013    Past Surgical History:  Procedure Laterality Date  . CARDIAC CATHETERIZATION  01/15/2004  . CORONARY ARTERY BYPASS GRAFT  01/16/2004   CABG's x4  . NM MYOCAR PERF WALL MOTION  01/16/2010   EF 65%,Mets 10    Current Medications: Outpatient Medications Prior to Visit  Medication Sig Dispense Refill  .  aspirin 325 MG tablet Take 325 mg by mouth daily.    . benazepril-hydrochlorthiazide (LOTENSIN HCT) 20-12.5 MG tablet TAKE 1/2 TABLET BY MOUTH DAILY 30 tablet 5  . cefdinir (OMNICEF) 300 MG capsule One twice a day 20 capsule 0  . [name] 3 1200 MG CAPS Take by mouth.    . simvastatin (ZOCOR) 40 MG tablet TAKE 1 TABLET BY MOUTH DAILY AT 6PM. 30 tablet 4  . metoprolol succinate (TOPROL-XL) 25 MG 24 hr tablet Take 1 tablet (25 mg total) daily by mouth. 30 tablet 4  . cycloSPORINE (RESTASIS) 0.05 % ophthalmic emulsion Place 1 drop into both eyes 2 (two) times daily.    Marland Kitchen NEXIUM 40 MG capsule take 1 capsule by mouth twice a day (Patient not taking: Reported on 10/07/2017) 60 capsule 4  . [name]-3 acid ethyl esters (LOVAZA) 1 G capsule Take 1 g by mouth 3 (three) times daily.     No facility-administered medications prior to visit.      Allergies:   Equal [aspartame] and Wellbutrin [bupropion]   Social History   Socioeconomic History  . Marital status: Divorced    Spouse name: None  . Number of children: None  . Years of education: None  . Highest education level: None  Social Needs  . Financial resource strain: None  . Food insecurity - worry: None  . Food insecurity - inability: None  . Transportation needs - medical: None  . Transportation needs - non-medical: None  Occupational History  . None  Tobacco Use  . Smoking status: Former Smoker    Last attempt to quit: 08/16/2005    Years since quitting: 12.1  . Smokeless tobacco: Never Used  Substance and Sexual Activity  . Alcohol use: Yes    Comment: rarely  . Drug use: No  . Sexual activity: None  Other Topics Concern  . None  Social History Narrative  . None      ROS:   Please see the history of present illness.    ROS All other systems reviewed and are negative.   PHYSICAL EXAM:   VS:  BP 132/62 (BP Location: Left Arm, Patient Position: Sitting, Cuff Size: Normal)   Pulse (!) 48   Ht 5\' 10"  (1.778 m)   Wt 154 lb 6.4  oz (70 kg)   BMI 22.15 kg/m    GEN: Well nourished, well developed, in no acute distress  HEENT: Has a bandage over his nose Neck: no JVD, carotid bruits, or masses Cardiac: bradyardia, RRR; no murmurs, rubs, or gallops,no edema  Respiratory:  clear to auscultation bilaterally, normal work of breathing GI: soft, nontender, nondistended, + BS MS: no deformity or atrophy  Skin: warm and dry, no rash Neuro:  Alert and Oriented x 3, Strength and sensation are intact Psych: euthymic mood, full affect  Wt Readings from Last 3 Encounters:  10/07/17 154 lb 6.4 oz (70 kg)  03/18/16 172 lb 12.8 oz (78.4 kg)  12/18/15 182 lb (82.6 kg)      Studies/Labs Reviewed:   EKG:  EKG is ordered today.  The ekg shows marked sinus bradycardia otherwise normal  Recent Labs: No results found for requested labs within last 8760 hours.   Lipid Panel    Component Value Date/Time   CHOL 128 10/18/2014 1033   TRIG 79 10/18/2014 1033   HDL 42 10/18/2014 1033   CHOLHDL 3.0 10/18/2014 1033   VLDL 16 10/18/2014 1033   LDLCALC 70 10/18/2014 1033     ASSESSMENT:    1. Coronary artery disease involving native coronary artery of native heart without angina pectoris   2. Essential hypertension   3. Dyslipidemia      PLAN:  In order of problems listed above:  1. CAD: He is asymptomatic.  He has lost additional weight, unfortunately probably because of difficulty eating.  Needs to keep his nutritional status up.  He 2. HTN: Will discontinue his metoprolol due to bradycardia.  His blood pressure control is likely to remain good, considering how much weight he has lost. 3. HLP: Statin.  Last available lipid profile was excellent.  I suspect it is even better now that he has lost so much weight.    Medication Adjustments/Labs and Tests Ordered: Current medicines are reviewed at length with the patient today.  Concerns regarding medicines are outlined above.  Medication changes, Labs and Tests ordered  today are listed in the Patient Instructions below. Patient Instructions  [name] has recommended making the following medication changes: 1. WEAN OFF Metoprolol - take 0.5 tablet for 1 week then STOP  Your physician recommends that you return for lab work at your convenience - FASTING.  [name] recommends that you schedule a follow-up appointment in 12 months. You will receive a reminder letter in the mail two months in advance. If you don't receive a letter, please call our office to schedule the follow-up appointment.  If you need a refill on your cardiac medications before your next appointment,  please call your pharmacy.    Signed, [name] Fair, MD  10/10/2017 11:48 AM    [facility] [address] [place], [place], [place]  [address] Phone: [phone]; Fax: [phone]

## 2017-10-14 DIAGNOSIS — C44321 Squamous cell carcinoma of skin of nose: Secondary | ICD-10-CM | POA: Diagnosis not present

## 2017-10-14 DIAGNOSIS — Z87891 Personal history of nicotine dependence: Secondary | ICD-10-CM | POA: Diagnosis not present

## 2017-10-14 DIAGNOSIS — C44320 Squamous cell carcinoma of skin of unspecified parts of face: Secondary | ICD-10-CM | POA: Diagnosis not present

## 2017-10-14 DIAGNOSIS — Z9889 Other specified postprocedural states: Secondary | ICD-10-CM | POA: Diagnosis not present

## 2017-10-14 DIAGNOSIS — Z808 Family history of malignant neoplasm of other organs or systems: Secondary | ICD-10-CM | POA: Diagnosis not present

## 2017-10-18 DIAGNOSIS — R590 Localized enlarged lymph nodes: Secondary | ICD-10-CM | POA: Diagnosis not present

## 2017-10-18 DIAGNOSIS — K8020 Calculus of gallbladder without cholecystitis without obstruction: Secondary | ICD-10-CM | POA: Diagnosis not present

## 2017-10-18 DIAGNOSIS — C44320 Squamous cell carcinoma of skin of unspecified parts of face: Secondary | ICD-10-CM | POA: Diagnosis not present

## 2017-10-18 DIAGNOSIS — C44321 Squamous cell carcinoma of skin of nose: Secondary | ICD-10-CM | POA: Diagnosis not present

## 2017-10-18 DIAGNOSIS — Z888 Allergy status to other drugs, medicaments and biological substances status: Secondary | ICD-10-CM | POA: Diagnosis not present

## 2017-10-18 DIAGNOSIS — R935 Abnormal findings on diagnostic imaging of other abdominal regions, including retroperitoneum: Secondary | ICD-10-CM | POA: Diagnosis not present

## 2017-10-24 DIAGNOSIS — C44329 Squamous cell carcinoma of skin of other parts of face: Secondary | ICD-10-CM | POA: Diagnosis not present

## 2017-10-25 DIAGNOSIS — R220 Localized swelling, mass and lump, head: Secondary | ICD-10-CM | POA: Diagnosis not present

## 2017-10-25 DIAGNOSIS — C4490 Unspecified malignant neoplasm of skin, unspecified: Secondary | ICD-10-CM | POA: Diagnosis not present

## 2017-10-25 DIAGNOSIS — C44320 Squamous cell carcinoma of skin of unspecified parts of face: Secondary | ICD-10-CM | POA: Diagnosis not present

## 2017-10-26 DIAGNOSIS — C44329 Squamous cell carcinoma of skin of other parts of face: Secondary | ICD-10-CM | POA: Diagnosis not present

## 2017-10-26 DIAGNOSIS — Z510 Encounter for antineoplastic radiation therapy: Secondary | ICD-10-CM | POA: Diagnosis not present

## 2017-11-08 DIAGNOSIS — Z510 Encounter for antineoplastic radiation therapy: Secondary | ICD-10-CM | POA: Diagnosis not present

## 2017-11-08 DIAGNOSIS — C44329 Squamous cell carcinoma of skin of other parts of face: Secondary | ICD-10-CM | POA: Diagnosis not present

## 2017-11-09 DIAGNOSIS — Z87891 Personal history of nicotine dependence: Secondary | ICD-10-CM | POA: Diagnosis not present

## 2017-11-09 DIAGNOSIS — Z808 Family history of malignant neoplasm of other organs or systems: Secondary | ICD-10-CM | POA: Diagnosis not present

## 2017-11-09 DIAGNOSIS — C44320 Squamous cell carcinoma of skin of unspecified parts of face: Secondary | ICD-10-CM | POA: Diagnosis not present

## 2017-11-09 DIAGNOSIS — Z801 Family history of malignant neoplasm of trachea, bronchus and lung: Secondary | ICD-10-CM | POA: Diagnosis not present

## 2017-11-09 DIAGNOSIS — Z9889 Other specified postprocedural states: Secondary | ICD-10-CM | POA: Diagnosis not present

## 2017-11-10 DIAGNOSIS — C44329 Squamous cell carcinoma of skin of other parts of face: Secondary | ICD-10-CM | POA: Diagnosis not present

## 2017-11-10 DIAGNOSIS — Z510 Encounter for antineoplastic radiation therapy: Secondary | ICD-10-CM | POA: Diagnosis not present

## 2017-11-11 DIAGNOSIS — C44329 Squamous cell carcinoma of skin of other parts of face: Secondary | ICD-10-CM | POA: Diagnosis not present

## 2017-11-11 DIAGNOSIS — Z510 Encounter for antineoplastic radiation therapy: Secondary | ICD-10-CM | POA: Diagnosis not present

## 2017-11-12 DIAGNOSIS — Z510 Encounter for antineoplastic radiation therapy: Secondary | ICD-10-CM | POA: Diagnosis not present

## 2017-11-12 DIAGNOSIS — C44329 Squamous cell carcinoma of skin of other parts of face: Secondary | ICD-10-CM | POA: Diagnosis not present

## 2017-11-15 DIAGNOSIS — C44329 Squamous cell carcinoma of skin of other parts of face: Secondary | ICD-10-CM | POA: Diagnosis not present

## 2017-11-15 DIAGNOSIS — Z510 Encounter for antineoplastic radiation therapy: Secondary | ICD-10-CM | POA: Diagnosis not present

## 2017-11-16 DIAGNOSIS — Z510 Encounter for antineoplastic radiation therapy: Secondary | ICD-10-CM | POA: Diagnosis not present

## 2017-11-16 DIAGNOSIS — C44329 Squamous cell carcinoma of skin of other parts of face: Secondary | ICD-10-CM | POA: Diagnosis not present

## 2017-11-17 DIAGNOSIS — Z510 Encounter for antineoplastic radiation therapy: Secondary | ICD-10-CM | POA: Diagnosis not present

## 2017-11-17 DIAGNOSIS — C44329 Squamous cell carcinoma of skin of other parts of face: Secondary | ICD-10-CM | POA: Diagnosis not present

## 2017-11-18 DIAGNOSIS — Z510 Encounter for antineoplastic radiation therapy: Secondary | ICD-10-CM | POA: Diagnosis not present

## 2017-11-18 DIAGNOSIS — C44329 Squamous cell carcinoma of skin of other parts of face: Secondary | ICD-10-CM | POA: Diagnosis not present

## 2017-11-19 ENCOUNTER — Other Ambulatory Visit: Payer: Self-pay | Admitting: Cardiovascular Disease

## 2017-11-19 DIAGNOSIS — C44329 Squamous cell carcinoma of skin of other parts of face: Secondary | ICD-10-CM | POA: Diagnosis not present

## 2017-11-19 DIAGNOSIS — Z510 Encounter for antineoplastic radiation therapy: Secondary | ICD-10-CM | POA: Diagnosis not present

## 2017-11-22 DIAGNOSIS — Z510 Encounter for antineoplastic radiation therapy: Secondary | ICD-10-CM | POA: Diagnosis not present

## 2017-11-22 DIAGNOSIS — C44329 Squamous cell carcinoma of skin of other parts of face: Secondary | ICD-10-CM | POA: Diagnosis not present

## 2017-11-23 DIAGNOSIS — Z510 Encounter for antineoplastic radiation therapy: Secondary | ICD-10-CM | POA: Diagnosis not present

## 2017-11-23 DIAGNOSIS — C44329 Squamous cell carcinoma of skin of other parts of face: Secondary | ICD-10-CM | POA: Diagnosis not present

## 2017-11-24 DIAGNOSIS — C44329 Squamous cell carcinoma of skin of other parts of face: Secondary | ICD-10-CM | POA: Diagnosis not present

## 2017-11-24 DIAGNOSIS — Z510 Encounter for antineoplastic radiation therapy: Secondary | ICD-10-CM | POA: Diagnosis not present

## 2017-11-25 DIAGNOSIS — C44329 Squamous cell carcinoma of skin of other parts of face: Secondary | ICD-10-CM | POA: Diagnosis not present

## 2017-11-25 DIAGNOSIS — Z510 Encounter for antineoplastic radiation therapy: Secondary | ICD-10-CM | POA: Diagnosis not present

## 2017-11-26 DIAGNOSIS — Z510 Encounter for antineoplastic radiation therapy: Secondary | ICD-10-CM | POA: Diagnosis not present

## 2017-11-26 DIAGNOSIS — C44329 Squamous cell carcinoma of skin of other parts of face: Secondary | ICD-10-CM | POA: Diagnosis not present

## 2017-11-29 DIAGNOSIS — Z510 Encounter for antineoplastic radiation therapy: Secondary | ICD-10-CM | POA: Diagnosis not present

## 2017-11-29 DIAGNOSIS — C44329 Squamous cell carcinoma of skin of other parts of face: Secondary | ICD-10-CM | POA: Diagnosis not present

## 2017-11-30 DIAGNOSIS — Z510 Encounter for antineoplastic radiation therapy: Secondary | ICD-10-CM | POA: Diagnosis not present

## 2017-11-30 DIAGNOSIS — C44329 Squamous cell carcinoma of skin of other parts of face: Secondary | ICD-10-CM | POA: Diagnosis not present

## 2017-12-01 DIAGNOSIS — E860 Dehydration: Secondary | ICD-10-CM | POA: Diagnosis not present

## 2017-12-01 DIAGNOSIS — C44320 Squamous cell carcinoma of skin of unspecified parts of face: Secondary | ICD-10-CM | POA: Diagnosis not present

## 2017-12-02 DIAGNOSIS — R1310 Dysphagia, unspecified: Secondary | ICD-10-CM | POA: Diagnosis not present

## 2017-12-02 DIAGNOSIS — C44320 Squamous cell carcinoma of skin of unspecified parts of face: Secondary | ICD-10-CM | POA: Diagnosis not present

## 2017-12-02 DIAGNOSIS — R627 Adult failure to thrive: Secondary | ICD-10-CM | POA: Diagnosis not present

## 2017-12-06 DIAGNOSIS — I2510 Atherosclerotic heart disease of native coronary artery without angina pectoris: Secondary | ICD-10-CM | POA: Diagnosis not present

## 2017-12-06 DIAGNOSIS — R627 Adult failure to thrive: Secondary | ICD-10-CM | POA: Diagnosis not present

## 2017-12-06 DIAGNOSIS — E43 Unspecified severe protein-calorie malnutrition: Secondary | ICD-10-CM | POA: Diagnosis not present

## 2017-12-06 DIAGNOSIS — Z681 Body mass index (BMI) 19 or less, adult: Secondary | ICD-10-CM | POA: Diagnosis not present

## 2017-12-06 DIAGNOSIS — I10 Essential (primary) hypertension: Secondary | ICD-10-CM | POA: Diagnosis not present

## 2017-12-06 DIAGNOSIS — I25810 Atherosclerosis of coronary artery bypass graft(s) without angina pectoris: Secondary | ICD-10-CM | POA: Diagnosis not present

## 2017-12-06 DIAGNOSIS — R1312 Dysphagia, oropharyngeal phase: Secondary | ICD-10-CM | POA: Diagnosis not present

## 2017-12-06 DIAGNOSIS — C760 Malignant neoplasm of head, face and neck: Secondary | ICD-10-CM | POA: Diagnosis not present

## 2017-12-06 DIAGNOSIS — Z951 Presence of aortocoronary bypass graft: Secondary | ICD-10-CM | POA: Diagnosis not present

## 2017-12-06 DIAGNOSIS — R1310 Dysphagia, unspecified: Secondary | ICD-10-CM | POA: Diagnosis not present

## 2017-12-06 DIAGNOSIS — K219 Gastro-esophageal reflux disease without esophagitis: Secondary | ICD-10-CM | POA: Diagnosis not present

## 2017-12-07 DIAGNOSIS — E43 Unspecified severe protein-calorie malnutrition: Secondary | ICD-10-CM | POA: Diagnosis not present

## 2017-12-07 DIAGNOSIS — I10 Essential (primary) hypertension: Secondary | ICD-10-CM | POA: Diagnosis not present

## 2017-12-07 DIAGNOSIS — Z681 Body mass index (BMI) 19 or less, adult: Secondary | ICD-10-CM | POA: Diagnosis not present

## 2017-12-07 DIAGNOSIS — Z951 Presence of aortocoronary bypass graft: Secondary | ICD-10-CM | POA: Diagnosis not present

## 2017-12-07 DIAGNOSIS — R1310 Dysphagia, unspecified: Secondary | ICD-10-CM | POA: Diagnosis not present

## 2017-12-07 DIAGNOSIS — K219 Gastro-esophageal reflux disease without esophagitis: Secondary | ICD-10-CM | POA: Diagnosis not present

## 2017-12-07 DIAGNOSIS — R627 Adult failure to thrive: Secondary | ICD-10-CM | POA: Diagnosis not present

## 2017-12-07 DIAGNOSIS — I2510 Atherosclerotic heart disease of native coronary artery without angina pectoris: Secondary | ICD-10-CM | POA: Diagnosis not present

## 2017-12-07 DIAGNOSIS — C760 Malignant neoplasm of head, face and neck: Secondary | ICD-10-CM | POA: Diagnosis not present

## 2017-12-07 DIAGNOSIS — R1312 Dysphagia, oropharyngeal phase: Secondary | ICD-10-CM | POA: Diagnosis not present

## 2017-12-07 DIAGNOSIS — I25810 Atherosclerosis of coronary artery bypass graft(s) without angina pectoris: Secondary | ICD-10-CM | POA: Diagnosis not present

## 2017-12-08 DIAGNOSIS — K219 Gastro-esophageal reflux disease without esophagitis: Secondary | ICD-10-CM | POA: Diagnosis not present

## 2017-12-08 DIAGNOSIS — R1312 Dysphagia, oropharyngeal phase: Secondary | ICD-10-CM | POA: Diagnosis not present

## 2017-12-08 DIAGNOSIS — I25810 Atherosclerosis of coronary artery bypass graft(s) without angina pectoris: Secondary | ICD-10-CM | POA: Diagnosis not present

## 2017-12-08 DIAGNOSIS — Z951 Presence of aortocoronary bypass graft: Secondary | ICD-10-CM | POA: Diagnosis not present

## 2017-12-08 DIAGNOSIS — Z681 Body mass index (BMI) 19 or less, adult: Secondary | ICD-10-CM | POA: Diagnosis not present

## 2017-12-08 DIAGNOSIS — I2510 Atherosclerotic heart disease of native coronary artery without angina pectoris: Secondary | ICD-10-CM | POA: Diagnosis not present

## 2017-12-08 DIAGNOSIS — I10 Essential (primary) hypertension: Secondary | ICD-10-CM | POA: Diagnosis not present

## 2017-12-08 DIAGNOSIS — E43 Unspecified severe protein-calorie malnutrition: Secondary | ICD-10-CM | POA: Diagnosis not present

## 2017-12-08 DIAGNOSIS — R627 Adult failure to thrive: Secondary | ICD-10-CM | POA: Diagnosis not present

## 2017-12-08 DIAGNOSIS — R1310 Dysphagia, unspecified: Secondary | ICD-10-CM | POA: Diagnosis not present

## 2017-12-08 DIAGNOSIS — C760 Malignant neoplasm of head, face and neck: Secondary | ICD-10-CM | POA: Diagnosis not present

## 2017-12-09 DIAGNOSIS — Z681 Body mass index (BMI) 19 or less, adult: Secondary | ICD-10-CM | POA: Diagnosis not present

## 2017-12-09 DIAGNOSIS — C760 Malignant neoplasm of head, face and neck: Secondary | ICD-10-CM | POA: Diagnosis not present

## 2017-12-09 DIAGNOSIS — R1312 Dysphagia, oropharyngeal phase: Secondary | ICD-10-CM | POA: Diagnosis not present

## 2017-12-09 DIAGNOSIS — I25810 Atherosclerosis of coronary artery bypass graft(s) without angina pectoris: Secondary | ICD-10-CM | POA: Diagnosis not present

## 2017-12-09 DIAGNOSIS — R1310 Dysphagia, unspecified: Secondary | ICD-10-CM | POA: Diagnosis not present

## 2017-12-09 DIAGNOSIS — Z951 Presence of aortocoronary bypass graft: Secondary | ICD-10-CM | POA: Diagnosis not present

## 2017-12-09 DIAGNOSIS — I10 Essential (primary) hypertension: Secondary | ICD-10-CM | POA: Diagnosis not present

## 2017-12-09 DIAGNOSIS — E43 Unspecified severe protein-calorie malnutrition: Secondary | ICD-10-CM | POA: Diagnosis not present

## 2017-12-09 DIAGNOSIS — R001 Bradycardia, unspecified: Secondary | ICD-10-CM | POA: Diagnosis not present

## 2017-12-09 DIAGNOSIS — I2510 Atherosclerotic heart disease of native coronary artery without angina pectoris: Secondary | ICD-10-CM | POA: Diagnosis not present

## 2017-12-09 DIAGNOSIS — K219 Gastro-esophageal reflux disease without esophagitis: Secondary | ICD-10-CM | POA: Diagnosis not present

## 2017-12-09 DIAGNOSIS — C44329 Squamous cell carcinoma of skin of other parts of face: Secondary | ICD-10-CM | POA: Diagnosis not present

## 2017-12-09 DIAGNOSIS — R627 Adult failure to thrive: Secondary | ICD-10-CM | POA: Diagnosis not present

## 2017-12-10 DIAGNOSIS — Z681 Body mass index (BMI) 19 or less, adult: Secondary | ICD-10-CM | POA: Diagnosis not present

## 2017-12-10 DIAGNOSIS — I25810 Atherosclerosis of coronary artery bypass graft(s) without angina pectoris: Secondary | ICD-10-CM | POA: Diagnosis not present

## 2017-12-10 DIAGNOSIS — Z951 Presence of aortocoronary bypass graft: Secondary | ICD-10-CM | POA: Diagnosis not present

## 2017-12-10 DIAGNOSIS — C44320 Squamous cell carcinoma of skin of unspecified parts of face: Secondary | ICD-10-CM | POA: Diagnosis not present

## 2017-12-10 DIAGNOSIS — C760 Malignant neoplasm of head, face and neck: Secondary | ICD-10-CM | POA: Diagnosis not present

## 2017-12-10 DIAGNOSIS — I10 Essential (primary) hypertension: Secondary | ICD-10-CM | POA: Diagnosis not present

## 2017-12-10 DIAGNOSIS — K219 Gastro-esophageal reflux disease without esophagitis: Secondary | ICD-10-CM | POA: Diagnosis not present

## 2017-12-10 DIAGNOSIS — I2510 Atherosclerotic heart disease of native coronary artery without angina pectoris: Secondary | ICD-10-CM | POA: Diagnosis not present

## 2017-12-10 DIAGNOSIS — E43 Unspecified severe protein-calorie malnutrition: Secondary | ICD-10-CM | POA: Diagnosis not present

## 2017-12-10 DIAGNOSIS — R1310 Dysphagia, unspecified: Secondary | ICD-10-CM | POA: Diagnosis not present

## 2017-12-10 DIAGNOSIS — R1312 Dysphagia, oropharyngeal phase: Secondary | ICD-10-CM | POA: Diagnosis not present

## 2017-12-10 DIAGNOSIS — C44329 Squamous cell carcinoma of skin of other parts of face: Secondary | ICD-10-CM | POA: Diagnosis not present

## 2017-12-10 DIAGNOSIS — R627 Adult failure to thrive: Secondary | ICD-10-CM | POA: Diagnosis not present

## 2017-12-11 DIAGNOSIS — C760 Malignant neoplasm of head, face and neck: Secondary | ICD-10-CM | POA: Diagnosis not present

## 2017-12-11 DIAGNOSIS — I10 Essential (primary) hypertension: Secondary | ICD-10-CM | POA: Diagnosis not present

## 2017-12-11 DIAGNOSIS — Z431 Encounter for attention to gastrostomy: Secondary | ICD-10-CM | POA: Diagnosis not present

## 2017-12-11 DIAGNOSIS — I2510 Atherosclerotic heart disease of native coronary artery without angina pectoris: Secondary | ICD-10-CM | POA: Diagnosis not present

## 2017-12-11 DIAGNOSIS — K219 Gastro-esophageal reflux disease without esophagitis: Secondary | ICD-10-CM | POA: Diagnosis not present

## 2017-12-11 DIAGNOSIS — E43 Unspecified severe protein-calorie malnutrition: Secondary | ICD-10-CM | POA: Diagnosis not present

## 2017-12-11 DIAGNOSIS — R1312 Dysphagia, oropharyngeal phase: Secondary | ICD-10-CM | POA: Diagnosis not present

## 2017-12-11 DIAGNOSIS — Z85828 Personal history of other malignant neoplasm of skin: Secondary | ICD-10-CM | POA: Diagnosis not present

## 2017-12-11 DIAGNOSIS — Z7982 Long term (current) use of aspirin: Secondary | ICD-10-CM | POA: Diagnosis not present

## 2017-12-13 DIAGNOSIS — C44329 Squamous cell carcinoma of skin of other parts of face: Secondary | ICD-10-CM | POA: Diagnosis not present

## 2017-12-13 DIAGNOSIS — Z510 Encounter for antineoplastic radiation therapy: Secondary | ICD-10-CM | POA: Diagnosis not present

## 2017-12-14 DIAGNOSIS — Z431 Encounter for attention to gastrostomy: Secondary | ICD-10-CM | POA: Diagnosis not present

## 2017-12-14 DIAGNOSIS — E43 Unspecified severe protein-calorie malnutrition: Secondary | ICD-10-CM | POA: Diagnosis not present

## 2017-12-14 DIAGNOSIS — K219 Gastro-esophageal reflux disease without esophagitis: Secondary | ICD-10-CM | POA: Diagnosis not present

## 2017-12-14 DIAGNOSIS — C44329 Squamous cell carcinoma of skin of other parts of face: Secondary | ICD-10-CM | POA: Diagnosis not present

## 2017-12-14 DIAGNOSIS — Z7982 Long term (current) use of aspirin: Secondary | ICD-10-CM | POA: Diagnosis not present

## 2017-12-14 DIAGNOSIS — I2510 Atherosclerotic heart disease of native coronary artery without angina pectoris: Secondary | ICD-10-CM | POA: Diagnosis not present

## 2017-12-14 DIAGNOSIS — C760 Malignant neoplasm of head, face and neck: Secondary | ICD-10-CM | POA: Diagnosis not present

## 2017-12-14 DIAGNOSIS — R1312 Dysphagia, oropharyngeal phase: Secondary | ICD-10-CM | POA: Diagnosis not present

## 2017-12-14 DIAGNOSIS — I10 Essential (primary) hypertension: Secondary | ICD-10-CM | POA: Diagnosis not present

## 2017-12-14 DIAGNOSIS — Z510 Encounter for antineoplastic radiation therapy: Secondary | ICD-10-CM | POA: Diagnosis not present

## 2017-12-14 DIAGNOSIS — Z85828 Personal history of other malignant neoplasm of skin: Secondary | ICD-10-CM | POA: Diagnosis not present

## 2017-12-15 DIAGNOSIS — Z510 Encounter for antineoplastic radiation therapy: Secondary | ICD-10-CM | POA: Diagnosis not present

## 2017-12-15 DIAGNOSIS — C44329 Squamous cell carcinoma of skin of other parts of face: Secondary | ICD-10-CM | POA: Diagnosis not present

## 2017-12-15 DIAGNOSIS — R1312 Dysphagia, oropharyngeal phase: Secondary | ICD-10-CM | POA: Diagnosis not present

## 2017-12-15 DIAGNOSIS — Z7982 Long term (current) use of aspirin: Secondary | ICD-10-CM | POA: Diagnosis not present

## 2017-12-15 DIAGNOSIS — I10 Essential (primary) hypertension: Secondary | ICD-10-CM | POA: Diagnosis not present

## 2017-12-15 DIAGNOSIS — K219 Gastro-esophageal reflux disease without esophagitis: Secondary | ICD-10-CM | POA: Diagnosis not present

## 2017-12-15 DIAGNOSIS — Z85828 Personal history of other malignant neoplasm of skin: Secondary | ICD-10-CM | POA: Diagnosis not present

## 2017-12-15 DIAGNOSIS — C760 Malignant neoplasm of head, face and neck: Secondary | ICD-10-CM | POA: Diagnosis not present

## 2017-12-15 DIAGNOSIS — E43 Unspecified severe protein-calorie malnutrition: Secondary | ICD-10-CM | POA: Diagnosis not present

## 2017-12-15 DIAGNOSIS — Z431 Encounter for attention to gastrostomy: Secondary | ICD-10-CM | POA: Diagnosis not present

## 2017-12-15 DIAGNOSIS — I2510 Atherosclerotic heart disease of native coronary artery without angina pectoris: Secondary | ICD-10-CM | POA: Diagnosis not present

## 2017-12-16 DIAGNOSIS — Z510 Encounter for antineoplastic radiation therapy: Secondary | ICD-10-CM | POA: Diagnosis not present

## 2017-12-16 DIAGNOSIS — C44329 Squamous cell carcinoma of skin of other parts of face: Secondary | ICD-10-CM | POA: Diagnosis not present

## 2017-12-17 DIAGNOSIS — Z510 Encounter for antineoplastic radiation therapy: Secondary | ICD-10-CM | POA: Diagnosis not present

## 2017-12-17 DIAGNOSIS — C44329 Squamous cell carcinoma of skin of other parts of face: Secondary | ICD-10-CM | POA: Diagnosis not present

## 2017-12-18 DIAGNOSIS — R627 Adult failure to thrive: Secondary | ICD-10-CM | POA: Diagnosis not present

## 2017-12-18 DIAGNOSIS — C44329 Squamous cell carcinoma of skin of other parts of face: Secondary | ICD-10-CM | POA: Diagnosis not present

## 2017-12-18 DIAGNOSIS — R1312 Dysphagia, oropharyngeal phase: Secondary | ICD-10-CM | POA: Diagnosis not present

## 2017-12-20 DIAGNOSIS — Z510 Encounter for antineoplastic radiation therapy: Secondary | ICD-10-CM | POA: Diagnosis not present

## 2017-12-20 DIAGNOSIS — C44329 Squamous cell carcinoma of skin of other parts of face: Secondary | ICD-10-CM | POA: Diagnosis not present

## 2017-12-21 DIAGNOSIS — K219 Gastro-esophageal reflux disease without esophagitis: Secondary | ICD-10-CM | POA: Diagnosis not present

## 2017-12-21 DIAGNOSIS — Z431 Encounter for attention to gastrostomy: Secondary | ICD-10-CM | POA: Diagnosis not present

## 2017-12-21 DIAGNOSIS — E43 Unspecified severe protein-calorie malnutrition: Secondary | ICD-10-CM | POA: Diagnosis not present

## 2017-12-21 DIAGNOSIS — Z85828 Personal history of other malignant neoplasm of skin: Secondary | ICD-10-CM | POA: Diagnosis not present

## 2017-12-21 DIAGNOSIS — I2510 Atherosclerotic heart disease of native coronary artery without angina pectoris: Secondary | ICD-10-CM | POA: Diagnosis not present

## 2017-12-21 DIAGNOSIS — Z7982 Long term (current) use of aspirin: Secondary | ICD-10-CM | POA: Diagnosis not present

## 2017-12-21 DIAGNOSIS — L720 Epidermal cyst: Secondary | ICD-10-CM | POA: Diagnosis not present

## 2017-12-21 DIAGNOSIS — R1312 Dysphagia, oropharyngeal phase: Secondary | ICD-10-CM | POA: Diagnosis not present

## 2017-12-21 DIAGNOSIS — C760 Malignant neoplasm of head, face and neck: Secondary | ICD-10-CM | POA: Diagnosis not present

## 2017-12-21 DIAGNOSIS — Z510 Encounter for antineoplastic radiation therapy: Secondary | ICD-10-CM | POA: Diagnosis not present

## 2017-12-21 DIAGNOSIS — I10 Essential (primary) hypertension: Secondary | ICD-10-CM | POA: Diagnosis not present

## 2017-12-21 DIAGNOSIS — C44329 Squamous cell carcinoma of skin of other parts of face: Secondary | ICD-10-CM | POA: Diagnosis not present

## 2017-12-22 DIAGNOSIS — Z510 Encounter for antineoplastic radiation therapy: Secondary | ICD-10-CM | POA: Diagnosis not present

## 2017-12-22 DIAGNOSIS — C44329 Squamous cell carcinoma of skin of other parts of face: Secondary | ICD-10-CM | POA: Diagnosis not present

## 2017-12-23 DIAGNOSIS — R627 Adult failure to thrive: Secondary | ICD-10-CM | POA: Diagnosis not present

## 2017-12-23 DIAGNOSIS — C44329 Squamous cell carcinoma of skin of other parts of face: Secondary | ICD-10-CM | POA: Diagnosis not present

## 2017-12-23 DIAGNOSIS — Z510 Encounter for antineoplastic radiation therapy: Secondary | ICD-10-CM | POA: Diagnosis not present

## 2017-12-23 DIAGNOSIS — R1312 Dysphagia, oropharyngeal phase: Secondary | ICD-10-CM | POA: Diagnosis not present

## 2017-12-24 DIAGNOSIS — Z510 Encounter for antineoplastic radiation therapy: Secondary | ICD-10-CM | POA: Diagnosis not present

## 2017-12-24 DIAGNOSIS — C44329 Squamous cell carcinoma of skin of other parts of face: Secondary | ICD-10-CM | POA: Diagnosis not present

## 2017-12-27 DIAGNOSIS — Z510 Encounter for antineoplastic radiation therapy: Secondary | ICD-10-CM | POA: Diagnosis not present

## 2017-12-27 DIAGNOSIS — C44329 Squamous cell carcinoma of skin of other parts of face: Secondary | ICD-10-CM | POA: Diagnosis not present

## 2017-12-28 DIAGNOSIS — C44329 Squamous cell carcinoma of skin of other parts of face: Secondary | ICD-10-CM | POA: Diagnosis not present

## 2017-12-28 DIAGNOSIS — Z431 Encounter for attention to gastrostomy: Secondary | ICD-10-CM | POA: Diagnosis not present

## 2017-12-28 DIAGNOSIS — C760 Malignant neoplasm of head, face and neck: Secondary | ICD-10-CM | POA: Diagnosis not present

## 2017-12-28 DIAGNOSIS — I2510 Atherosclerotic heart disease of native coronary artery without angina pectoris: Secondary | ICD-10-CM | POA: Diagnosis not present

## 2017-12-28 DIAGNOSIS — E43 Unspecified severe protein-calorie malnutrition: Secondary | ICD-10-CM | POA: Diagnosis not present

## 2017-12-28 DIAGNOSIS — R1312 Dysphagia, oropharyngeal phase: Secondary | ICD-10-CM | POA: Diagnosis not present

## 2017-12-28 DIAGNOSIS — Z510 Encounter for antineoplastic radiation therapy: Secondary | ICD-10-CM | POA: Diagnosis not present

## 2017-12-28 DIAGNOSIS — K219 Gastro-esophageal reflux disease without esophagitis: Secondary | ICD-10-CM | POA: Diagnosis not present

## 2017-12-28 DIAGNOSIS — Z7982 Long term (current) use of aspirin: Secondary | ICD-10-CM | POA: Diagnosis not present

## 2017-12-28 DIAGNOSIS — I10 Essential (primary) hypertension: Secondary | ICD-10-CM | POA: Diagnosis not present

## 2017-12-28 DIAGNOSIS — Z85828 Personal history of other malignant neoplasm of skin: Secondary | ICD-10-CM | POA: Diagnosis not present

## 2017-12-29 DIAGNOSIS — Z510 Encounter for antineoplastic radiation therapy: Secondary | ICD-10-CM | POA: Diagnosis not present

## 2017-12-29 DIAGNOSIS — C44329 Squamous cell carcinoma of skin of other parts of face: Secondary | ICD-10-CM | POA: Diagnosis not present

## 2017-12-30 DIAGNOSIS — Z510 Encounter for antineoplastic radiation therapy: Secondary | ICD-10-CM | POA: Diagnosis not present

## 2017-12-30 DIAGNOSIS — C44320 Squamous cell carcinoma of skin of unspecified parts of face: Secondary | ICD-10-CM | POA: Diagnosis not present

## 2017-12-30 DIAGNOSIS — C44329 Squamous cell carcinoma of skin of other parts of face: Secondary | ICD-10-CM | POA: Diagnosis not present

## 2017-12-30 DIAGNOSIS — E860 Dehydration: Secondary | ICD-10-CM | POA: Diagnosis not present

## 2017-12-31 DIAGNOSIS — C44329 Squamous cell carcinoma of skin of other parts of face: Secondary | ICD-10-CM | POA: Diagnosis not present

## 2017-12-31 DIAGNOSIS — Z510 Encounter for antineoplastic radiation therapy: Secondary | ICD-10-CM | POA: Diagnosis not present

## 2018-01-03 DIAGNOSIS — C44329 Squamous cell carcinoma of skin of other parts of face: Secondary | ICD-10-CM | POA: Diagnosis not present

## 2018-01-03 DIAGNOSIS — Z510 Encounter for antineoplastic radiation therapy: Secondary | ICD-10-CM | POA: Diagnosis not present

## 2018-01-04 DIAGNOSIS — C44329 Squamous cell carcinoma of skin of other parts of face: Secondary | ICD-10-CM | POA: Diagnosis not present

## 2018-01-04 DIAGNOSIS — Z510 Encounter for antineoplastic radiation therapy: Secondary | ICD-10-CM | POA: Diagnosis not present

## 2018-01-05 DIAGNOSIS — C760 Malignant neoplasm of head, face and neck: Secondary | ICD-10-CM | POA: Diagnosis not present

## 2018-01-05 DIAGNOSIS — Z7982 Long term (current) use of aspirin: Secondary | ICD-10-CM | POA: Diagnosis not present

## 2018-01-05 DIAGNOSIS — R1312 Dysphagia, oropharyngeal phase: Secondary | ICD-10-CM | POA: Diagnosis not present

## 2018-01-05 DIAGNOSIS — Z85828 Personal history of other malignant neoplasm of skin: Secondary | ICD-10-CM | POA: Diagnosis not present

## 2018-01-05 DIAGNOSIS — Z510 Encounter for antineoplastic radiation therapy: Secondary | ICD-10-CM | POA: Diagnosis not present

## 2018-01-05 DIAGNOSIS — E43 Unspecified severe protein-calorie malnutrition: Secondary | ICD-10-CM | POA: Diagnosis not present

## 2018-01-05 DIAGNOSIS — C44329 Squamous cell carcinoma of skin of other parts of face: Secondary | ICD-10-CM | POA: Diagnosis not present

## 2018-01-05 DIAGNOSIS — K219 Gastro-esophageal reflux disease without esophagitis: Secondary | ICD-10-CM | POA: Diagnosis not present

## 2018-01-05 DIAGNOSIS — I2510 Atherosclerotic heart disease of native coronary artery without angina pectoris: Secondary | ICD-10-CM | POA: Diagnosis not present

## 2018-01-05 DIAGNOSIS — Z431 Encounter for attention to gastrostomy: Secondary | ICD-10-CM | POA: Diagnosis not present

## 2018-01-05 DIAGNOSIS — I10 Essential (primary) hypertension: Secondary | ICD-10-CM | POA: Diagnosis not present

## 2018-01-06 DIAGNOSIS — Z510 Encounter for antineoplastic radiation therapy: Secondary | ICD-10-CM | POA: Diagnosis not present

## 2018-01-06 DIAGNOSIS — C44329 Squamous cell carcinoma of skin of other parts of face: Secondary | ICD-10-CM | POA: Diagnosis not present

## 2018-01-08 DIAGNOSIS — C44329 Squamous cell carcinoma of skin of other parts of face: Secondary | ICD-10-CM | POA: Diagnosis not present

## 2018-01-08 DIAGNOSIS — R627 Adult failure to thrive: Secondary | ICD-10-CM | POA: Diagnosis not present

## 2018-01-08 DIAGNOSIS — R1312 Dysphagia, oropharyngeal phase: Secondary | ICD-10-CM | POA: Diagnosis not present

## 2018-01-12 DIAGNOSIS — R918 Other nonspecific abnormal finding of lung field: Secondary | ICD-10-CM | POA: Diagnosis not present

## 2018-01-12 DIAGNOSIS — Z7982 Long term (current) use of aspirin: Secondary | ICD-10-CM | POA: Diagnosis not present

## 2018-01-12 DIAGNOSIS — K769 Liver disease, unspecified: Secondary | ICD-10-CM | POA: Diagnosis not present

## 2018-01-12 DIAGNOSIS — J439 Emphysema, unspecified: Secondary | ICD-10-CM | POA: Diagnosis not present

## 2018-01-12 DIAGNOSIS — R0600 Dyspnea, unspecified: Secondary | ICD-10-CM | POA: Diagnosis not present

## 2018-01-12 DIAGNOSIS — K219 Gastro-esophageal reflux disease without esophagitis: Secondary | ICD-10-CM | POA: Diagnosis not present

## 2018-01-12 DIAGNOSIS — C44320 Squamous cell carcinoma of skin of unspecified parts of face: Secondary | ICD-10-CM | POA: Diagnosis not present

## 2018-01-12 DIAGNOSIS — R05 Cough: Secondary | ICD-10-CM | POA: Diagnosis not present

## 2018-01-12 DIAGNOSIS — Z431 Encounter for attention to gastrostomy: Secondary | ICD-10-CM | POA: Diagnosis not present

## 2018-01-12 DIAGNOSIS — R0602 Shortness of breath: Secondary | ICD-10-CM | POA: Diagnosis not present

## 2018-01-12 DIAGNOSIS — C760 Malignant neoplasm of head, face and neck: Secondary | ICD-10-CM | POA: Diagnosis not present

## 2018-01-12 DIAGNOSIS — Z951 Presence of aortocoronary bypass graft: Secondary | ICD-10-CM | POA: Diagnosis not present

## 2018-01-12 DIAGNOSIS — Z923 Personal history of irradiation: Secondary | ICD-10-CM | POA: Diagnosis not present

## 2018-01-12 DIAGNOSIS — E43 Unspecified severe protein-calorie malnutrition: Secondary | ICD-10-CM | POA: Diagnosis not present

## 2018-01-12 DIAGNOSIS — R600 Localized edema: Secondary | ICD-10-CM | POA: Diagnosis not present

## 2018-01-12 DIAGNOSIS — I2510 Atherosclerotic heart disease of native coronary artery without angina pectoris: Secondary | ICD-10-CM | POA: Diagnosis not present

## 2018-01-12 DIAGNOSIS — I10 Essential (primary) hypertension: Secondary | ICD-10-CM | POA: Diagnosis not present

## 2018-01-12 DIAGNOSIS — R1312 Dysphagia, oropharyngeal phase: Secondary | ICD-10-CM | POA: Diagnosis not present

## 2018-01-12 DIAGNOSIS — Z85828 Personal history of other malignant neoplasm of skin: Secondary | ICD-10-CM | POA: Diagnosis not present

## 2018-01-12 DIAGNOSIS — J9811 Atelectasis: Secondary | ICD-10-CM | POA: Diagnosis not present

## 2018-01-12 DIAGNOSIS — J441 Chronic obstructive pulmonary disease with (acute) exacerbation: Secondary | ICD-10-CM | POA: Diagnosis not present

## 2018-01-12 DIAGNOSIS — Z87891 Personal history of nicotine dependence: Secondary | ICD-10-CM | POA: Diagnosis not present

## 2018-01-14 DIAGNOSIS — C119 Malignant neoplasm of nasopharynx, unspecified: Secondary | ICD-10-CM | POA: Diagnosis not present

## 2018-01-17 DIAGNOSIS — C44321 Squamous cell carcinoma of skin of nose: Secondary | ICD-10-CM | POA: Diagnosis not present

## 2018-01-18 DIAGNOSIS — R627 Adult failure to thrive: Secondary | ICD-10-CM | POA: Diagnosis not present

## 2018-01-18 DIAGNOSIS — R1312 Dysphagia, oropharyngeal phase: Secondary | ICD-10-CM | POA: Diagnosis not present

## 2018-01-18 DIAGNOSIS — C44329 Squamous cell carcinoma of skin of other parts of face: Secondary | ICD-10-CM | POA: Diagnosis not present

## 2018-01-20 DIAGNOSIS — Z7982 Long term (current) use of aspirin: Secondary | ICD-10-CM | POA: Diagnosis not present

## 2018-01-20 DIAGNOSIS — C760 Malignant neoplasm of head, face and neck: Secondary | ICD-10-CM | POA: Diagnosis not present

## 2018-01-20 DIAGNOSIS — R1312 Dysphagia, oropharyngeal phase: Secondary | ICD-10-CM | POA: Diagnosis not present

## 2018-01-20 DIAGNOSIS — E43 Unspecified severe protein-calorie malnutrition: Secondary | ICD-10-CM | POA: Diagnosis not present

## 2018-01-20 DIAGNOSIS — I10 Essential (primary) hypertension: Secondary | ICD-10-CM | POA: Diagnosis not present

## 2018-01-20 DIAGNOSIS — I2510 Atherosclerotic heart disease of native coronary artery without angina pectoris: Secondary | ICD-10-CM | POA: Diagnosis not present

## 2018-01-20 DIAGNOSIS — Z431 Encounter for attention to gastrostomy: Secondary | ICD-10-CM | POA: Diagnosis not present

## 2018-01-20 DIAGNOSIS — K219 Gastro-esophageal reflux disease without esophagitis: Secondary | ICD-10-CM | POA: Diagnosis not present

## 2018-01-20 DIAGNOSIS — Z85828 Personal history of other malignant neoplasm of skin: Secondary | ICD-10-CM | POA: Diagnosis not present

## 2018-01-21 DIAGNOSIS — C760 Malignant neoplasm of head, face and neck: Secondary | ICD-10-CM | POA: Diagnosis not present

## 2018-01-25 DIAGNOSIS — Z431 Encounter for attention to gastrostomy: Secondary | ICD-10-CM | POA: Diagnosis not present

## 2018-01-25 DIAGNOSIS — C760 Malignant neoplasm of head, face and neck: Secondary | ICD-10-CM | POA: Diagnosis not present

## 2018-01-25 DIAGNOSIS — I10 Essential (primary) hypertension: Secondary | ICD-10-CM | POA: Diagnosis not present

## 2018-01-25 DIAGNOSIS — Z7982 Long term (current) use of aspirin: Secondary | ICD-10-CM | POA: Diagnosis not present

## 2018-01-25 DIAGNOSIS — Z85828 Personal history of other malignant neoplasm of skin: Secondary | ICD-10-CM | POA: Diagnosis not present

## 2018-01-25 DIAGNOSIS — K219 Gastro-esophageal reflux disease without esophagitis: Secondary | ICD-10-CM | POA: Diagnosis not present

## 2018-01-25 DIAGNOSIS — I2510 Atherosclerotic heart disease of native coronary artery without angina pectoris: Secondary | ICD-10-CM | POA: Diagnosis not present

## 2018-01-25 DIAGNOSIS — R1312 Dysphagia, oropharyngeal phase: Secondary | ICD-10-CM | POA: Diagnosis not present

## 2018-01-25 DIAGNOSIS — E43 Unspecified severe protein-calorie malnutrition: Secondary | ICD-10-CM | POA: Diagnosis not present

## 2018-01-28 DIAGNOSIS — C760 Malignant neoplasm of head, face and neck: Secondary | ICD-10-CM | POA: Diagnosis not present

## 2018-02-04 DIAGNOSIS — R627 Adult failure to thrive: Secondary | ICD-10-CM | POA: Diagnosis not present

## 2018-02-04 DIAGNOSIS — R1312 Dysphagia, oropharyngeal phase: Secondary | ICD-10-CM | POA: Diagnosis not present

## 2018-02-04 DIAGNOSIS — C44329 Squamous cell carcinoma of skin of other parts of face: Secondary | ICD-10-CM | POA: Diagnosis not present

## 2018-02-07 DIAGNOSIS — I10 Essential (primary) hypertension: Secondary | ICD-10-CM | POA: Diagnosis not present

## 2018-02-07 DIAGNOSIS — K219 Gastro-esophageal reflux disease without esophagitis: Secondary | ICD-10-CM | POA: Diagnosis not present

## 2018-02-07 DIAGNOSIS — C760 Malignant neoplasm of head, face and neck: Secondary | ICD-10-CM | POA: Diagnosis not present

## 2018-02-07 DIAGNOSIS — I2510 Atherosclerotic heart disease of native coronary artery without angina pectoris: Secondary | ICD-10-CM | POA: Diagnosis not present

## 2018-02-07 DIAGNOSIS — Z85828 Personal history of other malignant neoplasm of skin: Secondary | ICD-10-CM | POA: Diagnosis not present

## 2018-02-07 DIAGNOSIS — Z431 Encounter for attention to gastrostomy: Secondary | ICD-10-CM | POA: Diagnosis not present

## 2018-02-07 DIAGNOSIS — R1312 Dysphagia, oropharyngeal phase: Secondary | ICD-10-CM | POA: Diagnosis not present

## 2018-02-07 DIAGNOSIS — E43 Unspecified severe protein-calorie malnutrition: Secondary | ICD-10-CM | POA: Diagnosis not present

## 2018-02-07 DIAGNOSIS — C44329 Squamous cell carcinoma of skin of other parts of face: Secondary | ICD-10-CM | POA: Diagnosis not present

## 2018-02-07 DIAGNOSIS — R627 Adult failure to thrive: Secondary | ICD-10-CM | POA: Diagnosis not present

## 2018-02-07 DIAGNOSIS — Z7982 Long term (current) use of aspirin: Secondary | ICD-10-CM | POA: Diagnosis not present

## 2018-02-08 ENCOUNTER — Encounter: Payer: Self-pay | Admitting: Cardiovascular Disease

## 2018-02-08 ENCOUNTER — Ambulatory Visit: Payer: Medicare HMO | Admitting: Cardiovascular Disease

## 2018-02-08 VITALS — BP 116/60 | HR 56 | Ht 70.0 in | Wt 160.0 lb

## 2018-02-08 DIAGNOSIS — E785 Hyperlipidemia, unspecified: Secondary | ICD-10-CM | POA: Diagnosis not present

## 2018-02-08 DIAGNOSIS — R600 Localized edema: Secondary | ICD-10-CM | POA: Diagnosis not present

## 2018-02-08 DIAGNOSIS — Z79899 Other long term (current) drug therapy: Secondary | ICD-10-CM

## 2018-02-08 DIAGNOSIS — I2510 Atherosclerotic heart disease of native coronary artery without angina pectoris: Secondary | ICD-10-CM

## 2018-02-08 MED ORDER — FUROSEMIDE 20 MG PO TABS: 20.0000 mg | ORAL_TABLET | Freq: Every day | ORAL | 3 refills | Status: DC | PRN

## 2018-02-08 MED ORDER — SIMVASTATIN 20 MG PO TABS: 20.0000 mg | ORAL_TABLET | Freq: Every day | ORAL | 3 refills | Status: DC

## 2018-02-08 NOTE — Progress Notes (Signed)
Patient ID: [name], male   DOB: Feb 26, 1946, 72 y.o.   MRN: [id number]    Cardiology Office Note    Date:  02/08/2018   ID:  [name], [name] 04/04/1946, MRN [id number]  PCP:  System, Pcp Not In  Cardiologist:   [name] Fair, MD   No chief complaint on file.   History of Present Illness:  [name] is a 72 y.o. male who underwent coronary bypass surgery for multivessel CAD in 2005. He has treated hyperlipidemia and hypertension.   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  Surgery was extensive, followed by external beam radiation and he has a hole between the roof of his mouth, the right nare and his face.  He had to have all 4 maxillary incisors extracted due to bone loss.  This makes it almost impossible to swallow anything other than water and even that is difficult to because water spilled onto his face.  His taste is also altered.  He lost down to 130 pounds, but after placement of the G-tube he is up to 160 pounds.    Recently, he has developed some swelling in his ankles.  On the other hand, in the last couple of months he has required IV fluids occasionally.  N-terminal proBNP was checked on May 29 and was normal at 280. At same visit his creatinine was 0.48 his albumin was decreased to 3.3, but improvement compared to 2.8 a month before.  The patient specifically denies any chest pain at rest exertion, dyspnea at rest or with exertion, orthopnea, paroxysmal nocturnal dyspnea, syncope, palpitations, focal neurological deficits, intermittent claudication,  cough, hemoptysis or wheezing.  He remains bradycardic and his blood pressure is still normal even after we discontinued his beta-blocker completely.  He has not had problems with coronary symptoms since his bypass surgery.  He has a "false positive" ECG treadmill response, but had a normal nuclear stress test in 2011. In 2016, his exercise capacity was  identical to 2011 (8 minutes on the Bruce protocol).  He has long-standing problems with erectile dysfunction.   Past Medical History:  Diagnosis Date  . 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 Laneta Simmers  . HTN (hypertension) 07/04/2013  . Hyperlipidemia 07/04/2013    Past Surgical History:  Procedure Laterality Date  . CARDIAC CATHETERIZATION  01/15/2004  . CORONARY ARTERY BYPASS GRAFT  01/16/2004   CABG's x4  . NM MYOCAR PERF WALL MOTION  01/16/2010   EF 65%,Mets 10    Current Medications: Outpatient Medications Prior to Visit  Medication Sig Dispense Refill  . aspirin EC 81 MG tablet Take 81 mg by mouth daily.    . Omega 3 1200 MG CAPS Take by mouth.    . simvastatin (ZOCOR) 40 MG tablet TAKE 1 TABLET BY MOUTH DAILY AT 6 PM 30 tablet 4  . aspirin 325 MG tablet Take 325 mg by mouth daily.    . benazepril-hydrochlorthiazide (LOTENSIN HCT) 20-12.5 MG tablet TAKE 1/2 TABLET BY MOUTH DAILY 30 tablet 5  . cefdinir (OMNICEF) 300 MG capsule One twice a day 20 capsule 0   No facility-administered medications prior to visit.      Allergies:   Equal [aspartame] and Wellbutrin [bupropion]   Social History   Socioeconomic History  . Marital status: Divorced    Spouse  name: Not on file  . Number of children: Not on file  . Years of education: Not on file  . Highest education level: Not on file  Occupational History  . Not on file  Social Needs  . Financial resource strain: Not on file  . Food insecurity:    Worry: Not on file    Inability: Not on file  . Transportation needs:    Medical: Not on file    Non-medical: Not on file  Tobacco Use  . Smoking status: Former Smoker    Last attempt to quit: 08/16/2005    Years since quitting: 12.4  . Smokeless tobacco: Never Used  Substance and Sexual Activity  . Alcohol use: Yes    Comment: rarely  . Drug use: No  . Sexual activity: Not on file    Lifestyle  . Physical activity:    Days per week: Not on file    Minutes per session: Not on file  . Stress: Not on file  Relationships  . Social connections:    Talks on phone: Not on file    Gets together: Not on file    Attends religious service: Not on file    Active member of club or organization: Not on file    Attends meetings of clubs or organizations: Not on file    Relationship status: Not on file  Other Topics Concern  . Not on file  Social History Narrative  . Not on file    ROS:   Please see the history of present illness.    ROS All other systems reviewed and are negative.   PHYSICAL EXAM:   VS:  BP 116/60   Pulse (!) 56   Ht 5\' 10"  (1.778 m)   Wt 160 lb (72.6 kg)   BMI 22.96 kg/m      General: Alert, oriented x3, no distress, very lean Head: Orifice connecting the face underneath his right nostril to the roof of his mouth, PERRL, EOMI, no exophtalmos or lid lag, no myxedema, no xanthelasma; normal ears, nose and oropharynx Neck: normal jugular venous pulsations and no hepatojugular reflux; brisk carotid pulses without delay and no carotid bruits Chest: clear to auscultation, no signs of consolidation by percussion or palpation, normal fremitus, symmetrical and full respiratory excursions Cardiovascular: normal position and quality of the apical impulse, regular rhythm, normal first and second heart sounds, no murmurs, rubs or gallops Abdomen: no tenderness or distention, no masses by palpation, no abnormal pulsatility or arterial bruits, normal bowel sounds, no hepatosplenomegaly Extremities: no clubbing, cyanosis; symmetrical 2+ ankle edema; 2+ radial, ulnar and brachial pulses bilaterally; 2+ right femoral, posterior tibial and dorsalis pedis pulses; 2+ left femoral, posterior tibial and dorsalis pedis pulses; no subclavian or femoral bruits Neurological: grossly nonfocal Psych: Normal mood and affect   Wt Readings from Last 3 Encounters:  02/08/18 160 lb  (72.6 kg)  10/07/17 154 lb 6.4 oz (70 kg)  03/18/16 172 lb 12.8 oz (78.4 kg)      Studies/Labs Reviewed:   EKG:  EKG is ordered today.  The ekg shows sinus bradycardia with sinus arrhythmia; one wide complex beat is likely a fusion between ventricular escape beat and a late sinus beat.  Recent Labs: No results found for requested labs within last 8760 hours.   Lipid Panel    Component Value Date/Time   CHOL 128 10/18/2014 1033   TRIG 79 10/18/2014 1033   HDL 42 10/18/2014 1033   CHOLHDL 3.0 10/18/2014 1033  VLDL 16 10/18/2014 1033   LDLCALC 70 10/18/2014 1033     ASSESSMENT:    1. Coronary artery disease involving native coronary artery of native heart without angina pectoris   2. Dyslipidemia   3. Pedal edema   4. Medication management      PLAN:  In order of problems listed above:  1. CAD: Asymptomatic.  Not sure how much that he needs at this point. Excellent blood pressure off all medicines following his weight loss 2. HLP: Asked him to cut his statin dose in half and will recheck lipid profile in several weeks. 3. Edema: I do not see any other findings to suggest that this is due to heart failure, but we can provide some palliation with intermittent loop diuretics.  I warned him not to take the medications on a daily basis.  I am more worried that he is prone to dehydration since it is difficult for him to drink.  The edema may be due to hypoalbuminemia, but this is improving.  At this point I do not think he needs an echocardiogram.    Medication Adjustments/Labs and Tests Ordered: Current medicines are reviewed at length with the patient today.  Concerns regarding medicines are outlined above.  Medication changes, Labs and Tests ordered today are listed in the Patient Instructions below. Patient Instructions  Medication Instructions: Dr Royann Shivers has recommended making the following medication changes: 1. DECREASE Simvastatin to 20 mg daily 2. TAKE Furosemide  20 mg as needed for ankle swelling - avoid taking more than 3 times a week  Labwork: Your physician recommends that you return for lab work in 4-6 weeks - FASTING.  Testing/Procedures: NONE ORDERED  Follow-up: Dr Royann Shivers recommends that you schedule a follow-up appointment in 3-4 months.  If you need a refill on your cardiac medications before your next appointment, please call your pharmacy.    Signed, [name] Fair, MD  02/08/2018 11:24 AM    [facility] [address] [place], [place], [place]  [address] Phone: [phone]; Fax: [phone]

## 2018-02-08 NOTE — Patient Instructions (Signed)
Medication Instructions: Dr Sallyanne Kuster has recommended making the following medication changes: 1. DECREASE Simvastatin to 20 mg daily 2. TAKE Furosemide 20 mg as needed for ankle swelling - avoid taking more than 3 times a week  Labwork: Your physician recommends that you return for lab work in 4-6 weeks - FASTING.  Testing/Procedures: NONE ORDERED  Follow-up: Dr Sallyanne Kuster recommends that you schedule a follow-up appointment in 3-4 months.  If you need a refill on your cardiac medications before your next appointment, please call your pharmacy.

## 2018-02-17 DIAGNOSIS — R1312 Dysphagia, oropharyngeal phase: Secondary | ICD-10-CM | POA: Diagnosis not present

## 2018-02-17 DIAGNOSIS — R627 Adult failure to thrive: Secondary | ICD-10-CM | POA: Diagnosis not present

## 2018-02-17 DIAGNOSIS — C44329 Squamous cell carcinoma of skin of other parts of face: Secondary | ICD-10-CM | POA: Diagnosis not present

## 2018-02-18 DIAGNOSIS — C760 Malignant neoplasm of head, face and neck: Secondary | ICD-10-CM | POA: Diagnosis not present

## 2018-02-21 DIAGNOSIS — R627 Adult failure to thrive: Secondary | ICD-10-CM | POA: Diagnosis not present

## 2018-02-21 DIAGNOSIS — R1312 Dysphagia, oropharyngeal phase: Secondary | ICD-10-CM | POA: Diagnosis not present

## 2018-02-21 DIAGNOSIS — C44329 Squamous cell carcinoma of skin of other parts of face: Secondary | ICD-10-CM | POA: Diagnosis not present

## 2018-03-06 DIAGNOSIS — R1312 Dysphagia, oropharyngeal phase: Secondary | ICD-10-CM | POA: Diagnosis not present

## 2018-03-06 DIAGNOSIS — R627 Adult failure to thrive: Secondary | ICD-10-CM | POA: Diagnosis not present

## 2018-03-06 DIAGNOSIS — C44329 Squamous cell carcinoma of skin of other parts of face: Secondary | ICD-10-CM | POA: Diagnosis not present

## 2018-03-16 DIAGNOSIS — Z79899 Other long term (current) drug therapy: Secondary | ICD-10-CM | POA: Diagnosis not present

## 2018-03-16 DIAGNOSIS — E785 Hyperlipidemia, unspecified: Secondary | ICD-10-CM | POA: Diagnosis not present

## 2018-03-16 LAB — COMPREHENSIVE METABOLIC PANEL
ALT: 18 IU/L (ref 0–44)
AST: 24 IU/L (ref 0–40)
Albumin/Globulin Ratio: 1.4 (ref 1.2–2.2)
Albumin: 4.2 g/dL (ref 3.6–4.8)
Alkaline Phosphatase: 139 IU/L — ABNORMAL HIGH (ref 39–117)
BUN/Creatinine Ratio: 31 — ABNORMAL HIGH (ref 10–24)
BUN: 21 mg/dL (ref 8–27)
Bilirubin Total: 0.6 mg/dL (ref 0.0–1.2)
CALCIUM: 9.6 mg/dL (ref 8.6–10.2)
CO2: 24 mmol/L (ref 20–29)
CREATININE: 0.67 mg/dL — AB (ref 0.76–1.27)
Chloride: 102 mmol/L (ref 96–106)
GFR calc non Af Amer: 98 mL/min/{1.73_m2} (ref >59)
GFR, EST AFRICAN AMERICAN: 113 mL/min/{1.73_m2} (ref >59)
GLOBULIN, TOTAL: 3.1 g/dL (ref 1.5–4.5)
Glucose: 96 mg/dL (ref 65–99)
Potassium: 5.5 mmol/L — ABNORMAL HIGH (ref 3.5–5.2)
SODIUM: 142 mmol/L (ref 134–144)
TOTAL PROTEIN: 7.3 g/dL (ref 6.0–8.5)

## 2018-03-16 LAB — LIPID PANEL
Chol/HDL Ratio: 2.2 ratio (ref 0.0–5.0)
Cholesterol, Total: 100 mg/dL (ref 100–199)
HDL: 45 mg/dL (ref >39)
LDL Calculated: 46 mg/dL (ref 0–99)
Triglycerides: 45 mg/dL (ref 0–149)
VLDL Cholesterol Cal: 9 mg/dL (ref 5–40)

## 2018-03-17 ENCOUNTER — Other Ambulatory Visit: Payer: Self-pay

## 2018-03-17 DIAGNOSIS — R627 Adult failure to thrive: Secondary | ICD-10-CM | POA: Diagnosis not present

## 2018-03-17 DIAGNOSIS — R1312 Dysphagia, oropharyngeal phase: Secondary | ICD-10-CM | POA: Diagnosis not present

## 2018-03-17 DIAGNOSIS — R899 Unspecified abnormal finding in specimens from other organs, systems and tissues: Secondary | ICD-10-CM

## 2018-03-17 DIAGNOSIS — C44329 Squamous cell carcinoma of skin of other parts of face: Secondary | ICD-10-CM | POA: Diagnosis not present

## 2018-03-18 DIAGNOSIS — C44320 Squamous cell carcinoma of skin of unspecified parts of face: Secondary | ICD-10-CM | POA: Diagnosis not present

## 2018-03-18 DIAGNOSIS — C760 Malignant neoplasm of head, face and neck: Secondary | ICD-10-CM | POA: Diagnosis not present

## 2018-03-20 DIAGNOSIS — C44329 Squamous cell carcinoma of skin of other parts of face: Secondary | ICD-10-CM | POA: Diagnosis not present

## 2018-03-20 DIAGNOSIS — R627 Adult failure to thrive: Secondary | ICD-10-CM | POA: Diagnosis not present

## 2018-03-20 DIAGNOSIS — R1312 Dysphagia, oropharyngeal phase: Secondary | ICD-10-CM | POA: Diagnosis not present

## 2018-03-28 DIAGNOSIS — R899 Unspecified abnormal finding in specimens from other organs, systems and tissues: Secondary | ICD-10-CM | POA: Diagnosis not present

## 2018-03-28 LAB — BASIC METABOLIC PANEL
BUN/Creatinine Ratio: 31 — ABNORMAL HIGH (ref 10–24)
BUN: 21 mg/dL (ref 8–27)
CO2: 25 mmol/L (ref 20–29)
Calcium: 9.6 mg/dL (ref 8.6–10.2)
Chloride: 102 mmol/L (ref 96–106)
Creatinine, Ser: 0.68 mg/dL — ABNORMAL LOW (ref 0.76–1.27)
GFR calc Af Amer: 113 mL/min/{1.73_m2} (ref >59)
GFR, EST NON AFRICAN AMERICAN: 97 mL/min/{1.73_m2} (ref >59)
Glucose: 96 mg/dL (ref 65–99)
POTASSIUM: 5.5 mmol/L — AB (ref 3.5–5.2)
SODIUM: 141 mmol/L (ref 134–144)

## 2018-03-29 DIAGNOSIS — R1312 Dysphagia, oropharyngeal phase: Secondary | ICD-10-CM | POA: Diagnosis not present

## 2018-03-29 DIAGNOSIS — C44329 Squamous cell carcinoma of skin of other parts of face: Secondary | ICD-10-CM | POA: Diagnosis not present

## 2018-03-29 DIAGNOSIS — R627 Adult failure to thrive: Secondary | ICD-10-CM | POA: Diagnosis not present

## 2018-04-05 ENCOUNTER — Telehealth: Payer: Self-pay | Admitting: Cardiovascular Disease

## 2018-04-05 DIAGNOSIS — E875 Hyperkalemia: Secondary | ICD-10-CM

## 2018-04-05 NOTE — Telephone Encounter (Signed)
New Message   Kim-Dietician with [facility] states the doctor wants the pt changed to tube feeding due to elevated potassium and she is requested orders and documentation faxed to them. Maudie Mercury will be out of the office after today. Please call Fax: [phone]

## 2018-04-05 NOTE — Telephone Encounter (Signed)
Spoke with Raquel Sarna a Registered Dietician with [facility].  Raquel Sarna sts that a verbal order is needed to make a change in the patient's tube feeding due to his elevated potassium. Raquel Sarna is aware of Dr.Croitoru's recommendation  Notes recorded by Sanda Klein, MD on 03/31/2018 at 5:43 PM EDT Recommended daily K intake for an adult is 40-80 mEq daily and he is getting 60 mEq daily, right in the middle of that recommended range. Nevertheless, it seems to be too much. Nepro is another product, also made by [employer]. It has just under half that amount of K for the same protein amount. Could we switch one or two of his cans of TwoCal to Nepro. I think that should do it.  (Ensure also has less K, but has a lot less protein).  Raquel Sarna verbalized understanding.  Spoke with the pt. Adv him that Dr.C recommends he repeat a bmet in 2-3 weeks. Pt will come to the office to have his lab drawn.

## 2018-04-05 NOTE — Addendum Note (Signed)
Addended by: Lamar Laundry on: 04/05/2018 02:54 PM   Modules accepted: Orders

## 2018-04-09 DIAGNOSIS — R627 Adult failure to thrive: Secondary | ICD-10-CM | POA: Diagnosis not present

## 2018-04-09 DIAGNOSIS — R1312 Dysphagia, oropharyngeal phase: Secondary | ICD-10-CM | POA: Diagnosis not present

## 2018-04-09 DIAGNOSIS — C44329 Squamous cell carcinoma of skin of other parts of face: Secondary | ICD-10-CM | POA: Diagnosis not present

## 2018-04-20 DIAGNOSIS — C44321 Squamous cell carcinoma of skin of nose: Secondary | ICD-10-CM | POA: Diagnosis not present

## 2018-04-20 DIAGNOSIS — C44329 Squamous cell carcinoma of skin of other parts of face: Secondary | ICD-10-CM | POA: Diagnosis not present

## 2018-04-20 DIAGNOSIS — R1312 Dysphagia, oropharyngeal phase: Secondary | ICD-10-CM | POA: Diagnosis not present

## 2018-04-20 DIAGNOSIS — K8020 Calculus of gallbladder without cholecystitis without obstruction: Secondary | ICD-10-CM | POA: Diagnosis not present

## 2018-04-20 DIAGNOSIS — E278 Other specified disorders of adrenal gland: Secondary | ICD-10-CM | POA: Diagnosis not present

## 2018-04-20 DIAGNOSIS — R627 Adult failure to thrive: Secondary | ICD-10-CM | POA: Diagnosis not present

## 2018-04-20 DIAGNOSIS — C44320 Squamous cell carcinoma of skin of unspecified parts of face: Secondary | ICD-10-CM | POA: Diagnosis not present

## 2018-04-20 DIAGNOSIS — K7689 Other specified diseases of liver: Secondary | ICD-10-CM | POA: Diagnosis not present

## 2018-04-22 DIAGNOSIS — Z9989 Dependence on other enabling machines and devices: Secondary | ICD-10-CM | POA: Diagnosis not present

## 2018-04-22 DIAGNOSIS — C44320 Squamous cell carcinoma of skin of unspecified parts of face: Secondary | ICD-10-CM | POA: Diagnosis not present

## 2018-04-22 DIAGNOSIS — Z85828 Personal history of other malignant neoplasm of skin: Secondary | ICD-10-CM | POA: Diagnosis not present

## 2018-04-22 DIAGNOSIS — Z08 Encounter for follow-up examination after completed treatment for malignant neoplasm: Secondary | ICD-10-CM | POA: Diagnosis not present

## 2018-04-22 DIAGNOSIS — K9189 Other postprocedural complications and disorders of digestive system: Secondary | ICD-10-CM | POA: Diagnosis not present

## 2018-05-03 DIAGNOSIS — E785 Hyperlipidemia, unspecified: Secondary | ICD-10-CM | POA: Diagnosis not present

## 2018-05-03 DIAGNOSIS — R1312 Dysphagia, oropharyngeal phase: Secondary | ICD-10-CM | POA: Diagnosis not present

## 2018-05-03 DIAGNOSIS — R899 Unspecified abnormal finding in specimens from other organs, systems and tissues: Secondary | ICD-10-CM | POA: Diagnosis not present

## 2018-05-03 DIAGNOSIS — I5022 Chronic systolic (congestive) heart failure: Secondary | ICD-10-CM | POA: Diagnosis not present

## 2018-05-03 DIAGNOSIS — R627 Adult failure to thrive: Secondary | ICD-10-CM | POA: Diagnosis not present

## 2018-05-03 DIAGNOSIS — C44329 Squamous cell carcinoma of skin of other parts of face: Secondary | ICD-10-CM | POA: Diagnosis not present

## 2018-05-03 LAB — LIPID PANEL
CHOL/HDL RATIO: 2.4 ratio (ref 0.0–5.0)
Cholesterol, Total: 139 mg/dL (ref 100–199)
HDL: 58 mg/dL (ref >39)
LDL Calculated: 68 mg/dL (ref 0–99)
TRIGLYCERIDES: 66 mg/dL (ref 0–149)
VLDL Cholesterol Cal: 13 mg/dL (ref 5–40)

## 2018-05-13 ENCOUNTER — Ambulatory Visit (INDEPENDENT_AMBULATORY_CARE_PROVIDER_SITE_OTHER): Payer: Medicare HMO | Admitting: Cardiovascular Disease

## 2018-05-13 ENCOUNTER — Encounter: Payer: Self-pay | Admitting: Cardiovascular Disease

## 2018-05-13 VITALS — BP 115/70 | HR 64 | Ht 70.0 in | Wt 151.8 lb

## 2018-05-13 DIAGNOSIS — R600 Localized edema: Secondary | ICD-10-CM | POA: Diagnosis not present

## 2018-05-13 DIAGNOSIS — I25810 Atherosclerosis of coronary artery bypass graft(s) without angina pectoris: Secondary | ICD-10-CM | POA: Diagnosis not present

## 2018-05-13 DIAGNOSIS — E7800 Pure hypercholesterolemia, unspecified: Secondary | ICD-10-CM | POA: Diagnosis not present

## 2018-05-13 NOTE — Progress Notes (Signed)
Patient ID: [name], male   DOB: May 16, 1946, 72 y.o.   MRN: [id number]    Cardiology Office Note    Date:  05/13/2018   ID:  Kyaw, Zarn February 04, 1946, MRN [id number]  PCP:  System, Pcp Not In  Cardiologist:   Thurmon Fair, MD   No chief complaint on file.   History of Present Illness:  [name] is a 72 y.o. male who underwent coronary bypass surgery for multivessel CAD in 2005. He has treated hyperlipidemia and hypertension.   He has not had any cardiovascular problems since his last appointment.  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  He has not been able to discuss reconstructive surgery yet.  Had some problems with hyperkalemia, which resolved after changes in his tube feedings.  His taste is recovering.  Despite continued weight loss his last albumin was greater than 4.  He continues to take a statin for hypercholesterolemia his cholesterol is not excessively reduced, even with all the weight loss.  Ankle swelling has resolved.  He denies angina or dyspnea.  He wants to start walking more.  He feels his legs are getting weak denies claudication or any focal neurological complaints.  He has not had problems with coronary symptoms since his bypass surgery.  He has a "false positive" ECG treadmill response, but had a normal nuclear stress test in 2011. In 2016, his exercise capacity was identical to 2011 (8 minutes on the Bruce protocol).  He has long-standing problems with erectile dysfunction.   Past Medical History:  Diagnosis Date  . 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 Laneta Simmers  . HTN (hypertension) 07/04/2013  . Hyperlipidemia 07/04/2013    Past Surgical History:  Procedure  Laterality Date  . CARDIAC CATHETERIZATION  01/15/2004  . CORONARY ARTERY BYPASS GRAFT  01/16/2004   CABG's x4  . NM MYOCAR PERF WALL MOTION  01/16/2010   EF 65%,Mets 10    Current Medications: Outpatient Medications Prior to Visit  Medication Sig Dispense Refill  . aspirin EC 81 MG tablet Take 81 mg by mouth daily.    . Omega 3 1200 MG CAPS Take by mouth.    . simvastatin (ZOCOR) 20 MG tablet Take 1 tablet (20 mg total) by mouth daily at 6 PM. 90 tablet 3  . furosemide (LASIX) 20 MG tablet Take 1 tablet (20 mg total) by mouth daily as needed for edema. 45 tablet 3   No facility-administered medications prior to visit.      Allergies:   Aspartame and Bupropion   Social History   Socioeconomic History  . Marital status: Divorced    Spouse name: Not on file  . Number of children: Not on file  . Years of education: Not on file  . Highest education level: Not on file  Occupational History  . Not on file  Social Needs  . Financial resource strain: Not on file  . Food insecurity:    Worry: Not on file    Inability: Not on file  . Transportation needs:    Medical: Not on file    Non-medical: Not on file  Tobacco Use  . Smoking status: Former Smoker  Last attempt to quit: 08/16/2005    Years since quitting: 12.7  . Smokeless tobacco: Never Used  Substance and Sexual Activity  . Alcohol use: Yes    Comment: rarely  . Drug use: No  . Sexual activity: Not on file  Lifestyle  . Physical activity:    Days per week: Not on file    Minutes per session: Not on file  . Stress: Not on file  Relationships  . Social connections:    Talks on phone: Not on file    Gets together: Not on file    Attends religious service: Not on file    Active member of club or organization: Not on file    Attends meetings of clubs or organizations: Not on file    Relationship status: Not on file  Other Topics Concern  . Not on file  Social History Narrative  . Not on file    ROS:   Please  see the history of present illness.    ROS all other systems are reviewed and are negative  PHYSICAL EXAM:   VS:  BP 115/70   Pulse 64   Ht 5\' 10"  (1.778 m)   Wt 151 lb 12.8 oz (68.9 kg)   BMI 21.78 kg/m     General: Alert, oriented x3, no distress, extremely lean Head: Large orifice connecting his right nostril to the roof of his mouth and right maxillary sinus, PERRL, EOMI, no exophtalmos or lid lag, no myxedema, no xanthelasma; normal ears, nose and oropharynx Neck: normal jugular venous pulsations and no hepatojugular reflux; brisk carotid pulses without delay and no carotid bruits Chest: clear to auscultation, no signs of consolidation by percussion or palpation, normal fremitus, symmetrical and full respiratory excursions Cardiovascular: normal position and quality of the apical impulse, regular rhythm, normal first and second heart sounds, no murmurs, rubs or gallops Abdomen: no tenderness or distention, no masses by palpation, no abnormal pulsatility or arterial bruits, normal bowel sounds, no hepatosplenomegaly Extremities: no clubbing, cyanosis or edema; 2+ radial, ulnar and brachial pulses bilaterally; 2+ right femoral, posterior tibial and dorsalis pedis pulses; 2+ left femoral, posterior tibial and dorsalis pedis pulses; no subclavian or femoral bruits Neurological: grossly nonfocal Psych: Normal mood and affect   Wt Readings from Last 3 Encounters:  05/13/18 151 lb 12.8 oz (68.9 kg)  02/08/18 160 lb (72.6 kg)  10/07/17 154 lb 6.4 oz (70 kg)      Studies/Labs Reviewed:   EKG:  EKG is ordered today.  The ekg shows sinus bradycardia with sinus arrhythmia; one wide complex beat is likely a fusion between ventricular escape beat and a late sinus beat.  Recent Labs: 03/16/2018: ALT 18 05/03/2018: BUN 20; Creatinine, Ser 0.86; Potassium 4.9; Sodium 142   Lipid Panel    Component Value Date/Time   CHOL 139 05/03/2018 1043   TRIG 66 05/03/2018 1043   HDL 58 05/03/2018  1043   CHOLHDL 2.4 05/03/2018 1043   CHOLHDL 3.0 10/18/2014 1033   VLDL 16 10/18/2014 1033   LDLCALC 68 05/03/2018 1043     ASSESSMENT:    1. Coronary artery disease involving coronary bypass graft of native heart without angina pectoris   2. Hypercholesterolemia   3. Leg edema      PLAN:  In order of problems listed above:  1. CAD: Asymptomatic without any antianginal medications.  Excellent lipid profile even after we reduced his statin dose 2. HLP: Parameters at target 3. Edema: No longer an issue.  Seems to have resolved in parallel with his hypoalbuminemia  He would like to discuss facial reconstructive surgery has had difficulty contacting his ENT physician in [place].  I did offer him some contact information in [place] for other specialists.  I suspect that his surgery may require cooperation with an oral surgeon and plastic surgeon as well.  From a cardiology point of view, he would be a good candidate for surgery with low risk of cardiovascular complications.    Medication Adjustments/Labs and Tests Ordered: Current medicines are reviewed at length with the patient today.  Concerns regarding medicines are outlined above.  Medication changes, Labs and Tests ordered today are listed in the Patient Instructions below. Patient Instructions  Dr Royann Shivers recommends that you schedule a follow-up appointment in 12 months. You will receive a reminder letter in the mail two months in advance. If you don't receive a letter, please call our office to schedule the follow-up appointment.  If you need a refill on your cardiac medications before your next appointment, please call your pharmacy.    Signed, Thurmon Fair, MD  05/13/2018 1:15 PM    [facility] [address] [place], [place], [place]  [address] Phone: [phone]; Fax: [phone]

## 2018-05-13 NOTE — Patient Instructions (Signed)
Dr Croitoru recommends that you schedule a follow-up appointment in 12 months. You will receive a reminder letter in the mail two months in advance. If you don't receive a letter, please call our office to schedule the follow-up appointment.  If you need a refill on your cardiac medications before your next appointment, please call your pharmacy. 

## 2018-05-20 DIAGNOSIS — C44329 Squamous cell carcinoma of skin of other parts of face: Secondary | ICD-10-CM | POA: Diagnosis not present

## 2018-05-20 DIAGNOSIS — R1312 Dysphagia, oropharyngeal phase: Secondary | ICD-10-CM | POA: Diagnosis not present

## 2018-05-20 DIAGNOSIS — R627 Adult failure to thrive: Secondary | ICD-10-CM | POA: Diagnosis not present

## 2018-05-24 LAB — BASIC METABOLIC PANEL
BUN / CREAT RATIO: 23 (ref 10–24)
BUN: 20 mg/dL (ref 8–27)
CO2: 23 mmol/L (ref 20–29)
CREATININE: 0.86 mg/dL (ref 0.76–1.27)
Calcium: 9.7 mg/dL (ref 8.6–10.2)
Chloride: 102 mmol/L (ref 96–106)
GFR calc Af Amer: 102 mL/min/{1.73_m2} (ref >59)
GFR calc non Af Amer: 88 mL/min/{1.73_m2} (ref >59)
GLUCOSE: 91 mg/dL (ref 65–99)
POTASSIUM: 4.9 mmol/L (ref 3.5–5.2)
SODIUM: 142 mmol/L (ref 134–144)

## 2018-05-24 LAB — SPECIMEN STATUS REPORT

## 2018-06-07 DIAGNOSIS — R627 Adult failure to thrive: Secondary | ICD-10-CM | POA: Diagnosis not present

## 2018-06-07 DIAGNOSIS — C44329 Squamous cell carcinoma of skin of other parts of face: Secondary | ICD-10-CM | POA: Diagnosis not present

## 2018-06-07 DIAGNOSIS — R1312 Dysphagia, oropharyngeal phase: Secondary | ICD-10-CM | POA: Diagnosis not present

## 2018-06-20 DIAGNOSIS — R1312 Dysphagia, oropharyngeal phase: Secondary | ICD-10-CM | POA: Diagnosis not present

## 2018-06-20 DIAGNOSIS — R627 Adult failure to thrive: Secondary | ICD-10-CM | POA: Diagnosis not present

## 2018-06-20 DIAGNOSIS — C44329 Squamous cell carcinoma of skin of other parts of face: Secondary | ICD-10-CM | POA: Diagnosis not present

## 2018-06-24 DIAGNOSIS — Z9989 Dependence on other enabling machines and devices: Secondary | ICD-10-CM | POA: Diagnosis not present

## 2018-06-24 DIAGNOSIS — Z08 Encounter for follow-up examination after completed treatment for malignant neoplasm: Secondary | ICD-10-CM | POA: Diagnosis not present

## 2018-06-24 DIAGNOSIS — Z85828 Personal history of other malignant neoplasm of skin: Secondary | ICD-10-CM | POA: Diagnosis not present

## 2018-06-24 DIAGNOSIS — C760 Malignant neoplasm of head, face and neck: Secondary | ICD-10-CM | POA: Diagnosis not present

## 2018-07-06 DIAGNOSIS — R1312 Dysphagia, oropharyngeal phase: Secondary | ICD-10-CM | POA: Diagnosis not present

## 2018-07-06 DIAGNOSIS — R627 Adult failure to thrive: Secondary | ICD-10-CM | POA: Diagnosis not present

## 2018-07-06 DIAGNOSIS — C44329 Squamous cell carcinoma of skin of other parts of face: Secondary | ICD-10-CM | POA: Diagnosis not present

## 2018-07-20 DIAGNOSIS — C44329 Squamous cell carcinoma of skin of other parts of face: Secondary | ICD-10-CM | POA: Diagnosis not present

## 2018-07-20 DIAGNOSIS — R627 Adult failure to thrive: Secondary | ICD-10-CM | POA: Diagnosis not present

## 2018-07-20 DIAGNOSIS — R1312 Dysphagia, oropharyngeal phase: Secondary | ICD-10-CM | POA: Diagnosis not present

## 2018-07-29 DIAGNOSIS — Z9861 Coronary angioplasty status: Secondary | ICD-10-CM | POA: Diagnosis not present

## 2018-07-29 DIAGNOSIS — I2510 Atherosclerotic heart disease of native coronary artery without angina pectoris: Secondary | ICD-10-CM | POA: Diagnosis not present

## 2018-07-29 DIAGNOSIS — K219 Gastro-esophageal reflux disease without esophagitis: Secondary | ICD-10-CM | POA: Diagnosis not present

## 2018-07-29 DIAGNOSIS — Z79899 Other long term (current) drug therapy: Secondary | ICD-10-CM | POA: Diagnosis not present

## 2018-07-29 DIAGNOSIS — Z888 Allergy status to other drugs, medicaments and biological substances status: Secondary | ICD-10-CM | POA: Diagnosis not present

## 2018-07-29 DIAGNOSIS — R030 Elevated blood-pressure reading, without diagnosis of hypertension: Secondary | ICD-10-CM | POA: Diagnosis not present

## 2018-07-29 DIAGNOSIS — Z87891 Personal history of nicotine dependence: Secondary | ICD-10-CM | POA: Diagnosis not present

## 2018-07-29 DIAGNOSIS — K9423 Gastrostomy malfunction: Secondary | ICD-10-CM | POA: Diagnosis not present

## 2018-07-29 DIAGNOSIS — Z7982 Long term (current) use of aspirin: Secondary | ICD-10-CM | POA: Diagnosis not present

## 2018-07-29 DIAGNOSIS — I10 Essential (primary) hypertension: Secondary | ICD-10-CM | POA: Diagnosis not present

## 2018-07-30 DIAGNOSIS — R627 Adult failure to thrive: Secondary | ICD-10-CM | POA: Diagnosis not present

## 2018-07-30 DIAGNOSIS — C44329 Squamous cell carcinoma of skin of other parts of face: Secondary | ICD-10-CM | POA: Diagnosis not present

## 2018-07-30 DIAGNOSIS — R1312 Dysphagia, oropharyngeal phase: Secondary | ICD-10-CM | POA: Diagnosis not present

## 2018-08-01 DIAGNOSIS — Q359 Cleft palate, unspecified: Secondary | ICD-10-CM | POA: Diagnosis not present

## 2018-08-01 DIAGNOSIS — C050 Malignant neoplasm of hard palate: Secondary | ICD-10-CM | POA: Diagnosis not present

## 2018-08-20 DIAGNOSIS — C44329 Squamous cell carcinoma of skin of other parts of face: Secondary | ICD-10-CM | POA: Diagnosis not present

## 2018-08-20 DIAGNOSIS — R1312 Dysphagia, oropharyngeal phase: Secondary | ICD-10-CM | POA: Diagnosis not present

## 2018-08-20 DIAGNOSIS — R627 Adult failure to thrive: Secondary | ICD-10-CM | POA: Diagnosis not present

## 2018-08-23 DIAGNOSIS — R1312 Dysphagia, oropharyngeal phase: Secondary | ICD-10-CM | POA: Diagnosis not present

## 2018-08-23 DIAGNOSIS — C44329 Squamous cell carcinoma of skin of other parts of face: Secondary | ICD-10-CM | POA: Diagnosis not present

## 2018-08-23 DIAGNOSIS — R627 Adult failure to thrive: Secondary | ICD-10-CM | POA: Diagnosis not present

## 2018-09-20 DIAGNOSIS — R1312 Dysphagia, oropharyngeal phase: Secondary | ICD-10-CM | POA: Diagnosis not present

## 2018-09-20 DIAGNOSIS — R627 Adult failure to thrive: Secondary | ICD-10-CM | POA: Diagnosis not present

## 2018-09-20 DIAGNOSIS — C44329 Squamous cell carcinoma of skin of other parts of face: Secondary | ICD-10-CM | POA: Diagnosis not present

## 2018-09-21 DIAGNOSIS — R1312 Dysphagia, oropharyngeal phase: Secondary | ICD-10-CM | POA: Diagnosis not present

## 2018-09-21 DIAGNOSIS — R627 Adult failure to thrive: Secondary | ICD-10-CM | POA: Diagnosis not present

## 2018-09-21 DIAGNOSIS — C44329 Squamous cell carcinoma of skin of other parts of face: Secondary | ICD-10-CM | POA: Diagnosis not present

## 2018-09-22 DIAGNOSIS — R600 Localized edema: Secondary | ICD-10-CM | POA: Diagnosis not present

## 2018-09-22 DIAGNOSIS — T7840XA Allergy, unspecified, initial encounter: Secondary | ICD-10-CM | POA: Diagnosis not present

## 2018-09-22 DIAGNOSIS — B0089 Other herpesviral infection: Secondary | ICD-10-CM | POA: Diagnosis not present

## 2018-09-30 DIAGNOSIS — Z9989 Dependence on other enabling machines and devices: Secondary | ICD-10-CM | POA: Diagnosis not present

## 2018-09-30 DIAGNOSIS — Z85828 Personal history of other malignant neoplasm of skin: Secondary | ICD-10-CM | POA: Diagnosis not present

## 2018-09-30 DIAGNOSIS — Z87891 Personal history of nicotine dependence: Secondary | ICD-10-CM | POA: Diagnosis not present

## 2018-09-30 DIAGNOSIS — Z08 Encounter for follow-up examination after completed treatment for malignant neoplasm: Secondary | ICD-10-CM | POA: Diagnosis not present

## 2018-09-30 DIAGNOSIS — C760 Malignant neoplasm of head, face and neck: Secondary | ICD-10-CM | POA: Diagnosis not present

## 2018-10-12 DIAGNOSIS — C44329 Squamous cell carcinoma of skin of other parts of face: Secondary | ICD-10-CM | POA: Diagnosis not present

## 2018-10-12 DIAGNOSIS — R627 Adult failure to thrive: Secondary | ICD-10-CM | POA: Diagnosis not present

## 2018-10-12 DIAGNOSIS — R1312 Dysphagia, oropharyngeal phase: Secondary | ICD-10-CM | POA: Diagnosis not present

## 2018-10-31 DIAGNOSIS — R627 Adult failure to thrive: Secondary | ICD-10-CM | POA: Diagnosis not present

## 2018-10-31 DIAGNOSIS — C44329 Squamous cell carcinoma of skin of other parts of face: Secondary | ICD-10-CM | POA: Diagnosis not present

## 2018-10-31 DIAGNOSIS — R1312 Dysphagia, oropharyngeal phase: Secondary | ICD-10-CM | POA: Diagnosis not present

## 2018-11-24 DIAGNOSIS — C44329 Squamous cell carcinoma of skin of other parts of face: Secondary | ICD-10-CM | POA: Diagnosis not present

## 2018-11-24 DIAGNOSIS — R1312 Dysphagia, oropharyngeal phase: Secondary | ICD-10-CM | POA: Diagnosis not present

## 2018-11-24 DIAGNOSIS — R627 Adult failure to thrive: Secondary | ICD-10-CM | POA: Diagnosis not present

## 2018-12-06 DIAGNOSIS — C44329 Squamous cell carcinoma of skin of other parts of face: Secondary | ICD-10-CM | POA: Diagnosis not present

## 2018-12-06 DIAGNOSIS — R627 Adult failure to thrive: Secondary | ICD-10-CM | POA: Diagnosis not present

## 2018-12-06 DIAGNOSIS — R1312 Dysphagia, oropharyngeal phase: Secondary | ICD-10-CM | POA: Diagnosis not present

## 2019-01-02 DIAGNOSIS — R627 Adult failure to thrive: Secondary | ICD-10-CM | POA: Diagnosis not present

## 2019-01-02 DIAGNOSIS — C44329 Squamous cell carcinoma of skin of other parts of face: Secondary | ICD-10-CM | POA: Diagnosis not present

## 2019-01-02 DIAGNOSIS — R1312 Dysphagia, oropharyngeal phase: Secondary | ICD-10-CM | POA: Diagnosis not present

## 2019-01-16 DIAGNOSIS — C44329 Squamous cell carcinoma of skin of other parts of face: Secondary | ICD-10-CM | POA: Diagnosis not present

## 2019-01-16 DIAGNOSIS — R627 Adult failure to thrive: Secondary | ICD-10-CM | POA: Diagnosis not present

## 2019-01-16 DIAGNOSIS — R1312 Dysphagia, oropharyngeal phase: Secondary | ICD-10-CM | POA: Diagnosis not present

## 2019-02-03 DIAGNOSIS — R627 Adult failure to thrive: Secondary | ICD-10-CM | POA: Diagnosis not present

## 2019-02-03 DIAGNOSIS — Z85828 Personal history of other malignant neoplasm of skin: Secondary | ICD-10-CM | POA: Diagnosis not present

## 2019-02-03 DIAGNOSIS — Z08 Encounter for follow-up examination after completed treatment for malignant neoplasm: Secondary | ICD-10-CM | POA: Diagnosis not present

## 2019-02-03 DIAGNOSIS — R1312 Dysphagia, oropharyngeal phase: Secondary | ICD-10-CM | POA: Diagnosis not present

## 2019-02-03 DIAGNOSIS — C44320 Squamous cell carcinoma of skin of unspecified parts of face: Secondary | ICD-10-CM | POA: Diagnosis not present

## 2019-02-03 DIAGNOSIS — C44329 Squamous cell carcinoma of skin of other parts of face: Secondary | ICD-10-CM | POA: Diagnosis not present

## 2019-02-13 DIAGNOSIS — Z85818 Personal history of malignant neoplasm of other sites of lip, oral cavity, and pharynx: Secondary | ICD-10-CM | POA: Diagnosis not present

## 2019-02-13 DIAGNOSIS — Z923 Personal history of irradiation: Secondary | ICD-10-CM | POA: Diagnosis not present

## 2019-02-13 DIAGNOSIS — H6121 Impacted cerumen, right ear: Secondary | ICD-10-CM | POA: Diagnosis not present

## 2019-02-13 DIAGNOSIS — Q359 Cleft palate, unspecified: Secondary | ICD-10-CM | POA: Diagnosis not present

## 2019-02-13 DIAGNOSIS — J3489 Other specified disorders of nose and nasal sinuses: Secondary | ICD-10-CM | POA: Diagnosis not present

## 2019-02-14 DIAGNOSIS — J439 Emphysema, unspecified: Secondary | ICD-10-CM | POA: Diagnosis not present

## 2019-02-14 DIAGNOSIS — C44320 Squamous cell carcinoma of skin of unspecified parts of face: Secondary | ICD-10-CM | POA: Diagnosis not present

## 2019-02-14 DIAGNOSIS — R932 Abnormal findings on diagnostic imaging of liver and biliary tract: Secondary | ICD-10-CM | POA: Diagnosis not present

## 2019-02-14 DIAGNOSIS — C44321 Squamous cell carcinoma of skin of nose: Secondary | ICD-10-CM | POA: Diagnosis not present

## 2019-02-15 DIAGNOSIS — R1312 Dysphagia, oropharyngeal phase: Secondary | ICD-10-CM | POA: Diagnosis not present

## 2019-02-15 DIAGNOSIS — R627 Adult failure to thrive: Secondary | ICD-10-CM | POA: Diagnosis not present

## 2019-02-15 DIAGNOSIS — C44329 Squamous cell carcinoma of skin of other parts of face: Secondary | ICD-10-CM | POA: Diagnosis not present

## 2019-02-27 DIAGNOSIS — H6983 Other specified disorders of Eustachian tube, bilateral: Secondary | ICD-10-CM | POA: Diagnosis not present

## 2019-02-27 DIAGNOSIS — H906 Mixed conductive and sensorineural hearing loss, bilateral: Secondary | ICD-10-CM | POA: Diagnosis not present

## 2019-02-27 DIAGNOSIS — H90A31 Mixed conductive and sensorineural hearing loss, unilateral, right ear with restricted hearing on the contralateral side: Secondary | ICD-10-CM | POA: Diagnosis not present

## 2019-02-27 DIAGNOSIS — H6501 Acute serous otitis media, right ear: Secondary | ICD-10-CM | POA: Diagnosis not present

## 2019-02-27 DIAGNOSIS — C44320 Squamous cell carcinoma of skin of unspecified parts of face: Secondary | ICD-10-CM | POA: Diagnosis not present

## 2019-02-27 DIAGNOSIS — H9113 Presbycusis, bilateral: Secondary | ICD-10-CM | POA: Diagnosis not present

## 2019-03-14 DIAGNOSIS — C44329 Squamous cell carcinoma of skin of other parts of face: Secondary | ICD-10-CM | POA: Diagnosis not present

## 2019-03-14 DIAGNOSIS — R627 Adult failure to thrive: Secondary | ICD-10-CM | POA: Diagnosis not present

## 2019-03-14 DIAGNOSIS — R1312 Dysphagia, oropharyngeal phase: Secondary | ICD-10-CM | POA: Diagnosis not present

## 2019-03-18 DIAGNOSIS — R1312 Dysphagia, oropharyngeal phase: Secondary | ICD-10-CM | POA: Diagnosis not present

## 2019-03-18 DIAGNOSIS — R627 Adult failure to thrive: Secondary | ICD-10-CM | POA: Diagnosis not present

## 2019-03-18 DIAGNOSIS — C44329 Squamous cell carcinoma of skin of other parts of face: Secondary | ICD-10-CM | POA: Diagnosis not present

## 2019-03-21 DIAGNOSIS — H6983 Other specified disorders of Eustachian tube, bilateral: Secondary | ICD-10-CM | POA: Diagnosis not present

## 2019-03-21 DIAGNOSIS — H903 Sensorineural hearing loss, bilateral: Secondary | ICD-10-CM | POA: Diagnosis not present

## 2019-03-24 ENCOUNTER — Other Ambulatory Visit: Payer: Self-pay | Admitting: Cardiovascular Disease

## 2019-04-03 DIAGNOSIS — H6501 Acute serous otitis media, right ear: Secondary | ICD-10-CM | POA: Diagnosis not present

## 2019-04-10 DIAGNOSIS — R1312 Dysphagia, oropharyngeal phase: Secondary | ICD-10-CM | POA: Diagnosis not present

## 2019-04-10 DIAGNOSIS — C44329 Squamous cell carcinoma of skin of other parts of face: Secondary | ICD-10-CM | POA: Diagnosis not present

## 2019-04-10 DIAGNOSIS — R627 Adult failure to thrive: Secondary | ICD-10-CM | POA: Diagnosis not present

## 2019-04-18 DIAGNOSIS — R627 Adult failure to thrive: Secondary | ICD-10-CM | POA: Diagnosis not present

## 2019-04-18 DIAGNOSIS — C44329 Squamous cell carcinoma of skin of other parts of face: Secondary | ICD-10-CM | POA: Diagnosis not present

## 2019-04-18 DIAGNOSIS — R1312 Dysphagia, oropharyngeal phase: Secondary | ICD-10-CM | POA: Diagnosis not present

## 2019-04-19 DIAGNOSIS — H6983 Other specified disorders of Eustachian tube, bilateral: Secondary | ICD-10-CM | POA: Diagnosis not present

## 2019-04-25 DIAGNOSIS — Z888 Allergy status to other drugs, medicaments and biological substances status: Secondary | ICD-10-CM | POA: Diagnosis not present

## 2019-04-25 DIAGNOSIS — I2510 Atherosclerotic heart disease of native coronary artery without angina pectoris: Secondary | ICD-10-CM | POA: Diagnosis not present

## 2019-04-25 DIAGNOSIS — Z7982 Long term (current) use of aspirin: Secondary | ICD-10-CM | POA: Diagnosis not present

## 2019-04-25 DIAGNOSIS — K068 Other specified disorders of gingiva and edentulous alveolar ridge: Secondary | ICD-10-CM | POA: Diagnosis not present

## 2019-04-25 DIAGNOSIS — K047 Periapical abscess without sinus: Secondary | ICD-10-CM | POA: Diagnosis not present

## 2019-04-25 DIAGNOSIS — Z87891 Personal history of nicotine dependence: Secondary | ICD-10-CM | POA: Diagnosis not present

## 2019-04-25 DIAGNOSIS — K1379 Other lesions of oral mucosa: Secondary | ICD-10-CM | POA: Diagnosis not present

## 2019-04-25 DIAGNOSIS — Z85828 Personal history of other malignant neoplasm of skin: Secondary | ICD-10-CM | POA: Diagnosis not present

## 2019-04-25 DIAGNOSIS — Z79899 Other long term (current) drug therapy: Secondary | ICD-10-CM | POA: Diagnosis not present

## 2019-04-25 DIAGNOSIS — I10 Essential (primary) hypertension: Secondary | ICD-10-CM | POA: Diagnosis not present

## 2019-05-01 DIAGNOSIS — H90A32 Mixed conductive and sensorineural hearing loss, unilateral, left ear with restricted hearing on the contralateral side: Secondary | ICD-10-CM | POA: Diagnosis not present

## 2019-05-01 DIAGNOSIS — H90A21 Sensorineural hearing loss, unilateral, right ear, with restricted hearing on the contralateral side: Secondary | ICD-10-CM | POA: Diagnosis not present

## 2019-05-15 DIAGNOSIS — C44329 Squamous cell carcinoma of skin of other parts of face: Secondary | ICD-10-CM | POA: Diagnosis not present

## 2019-05-15 DIAGNOSIS — R1312 Dysphagia, oropharyngeal phase: Secondary | ICD-10-CM | POA: Diagnosis not present

## 2019-05-15 DIAGNOSIS — R627 Adult failure to thrive: Secondary | ICD-10-CM | POA: Diagnosis not present

## 2019-05-17 DIAGNOSIS — C44329 Squamous cell carcinoma of skin of other parts of face: Secondary | ICD-10-CM | POA: Diagnosis not present

## 2019-05-17 DIAGNOSIS — R1312 Dysphagia, oropharyngeal phase: Secondary | ICD-10-CM | POA: Diagnosis not present

## 2019-05-17 DIAGNOSIS — R627 Adult failure to thrive: Secondary | ICD-10-CM | POA: Diagnosis not present

## 2019-05-18 DIAGNOSIS — R627 Adult failure to thrive: Secondary | ICD-10-CM | POA: Diagnosis not present

## 2019-05-18 DIAGNOSIS — R1312 Dysphagia, oropharyngeal phase: Secondary | ICD-10-CM | POA: Diagnosis not present

## 2019-05-18 DIAGNOSIS — H6983 Other specified disorders of Eustachian tube, bilateral: Secondary | ICD-10-CM | POA: Diagnosis not present

## 2019-05-18 DIAGNOSIS — C44329 Squamous cell carcinoma of skin of other parts of face: Secondary | ICD-10-CM | POA: Diagnosis not present

## 2019-06-05 DIAGNOSIS — R627 Adult failure to thrive: Secondary | ICD-10-CM | POA: Diagnosis not present

## 2019-06-05 DIAGNOSIS — R1312 Dysphagia, oropharyngeal phase: Secondary | ICD-10-CM | POA: Diagnosis not present

## 2019-06-05 DIAGNOSIS — C44329 Squamous cell carcinoma of skin of other parts of face: Secondary | ICD-10-CM | POA: Diagnosis not present

## 2019-06-09 DIAGNOSIS — R627 Adult failure to thrive: Secondary | ICD-10-CM | POA: Diagnosis not present

## 2019-06-09 DIAGNOSIS — K089 Disorder of teeth and supporting structures, unspecified: Secondary | ICD-10-CM | POA: Diagnosis not present

## 2019-06-09 DIAGNOSIS — Z87891 Personal history of nicotine dependence: Secondary | ICD-10-CM | POA: Diagnosis not present

## 2019-06-09 DIAGNOSIS — Z08 Encounter for follow-up examination after completed treatment for malignant neoplasm: Secondary | ICD-10-CM | POA: Diagnosis not present

## 2019-06-09 DIAGNOSIS — Z85828 Personal history of other malignant neoplasm of skin: Secondary | ICD-10-CM | POA: Diagnosis not present

## 2019-06-09 DIAGNOSIS — C44320 Squamous cell carcinoma of skin of unspecified parts of face: Secondary | ICD-10-CM | POA: Diagnosis not present

## 2019-06-09 DIAGNOSIS — C44329 Squamous cell carcinoma of skin of other parts of face: Secondary | ICD-10-CM | POA: Diagnosis not present

## 2019-06-09 DIAGNOSIS — R1312 Dysphagia, oropharyngeal phase: Secondary | ICD-10-CM | POA: Diagnosis not present

## 2019-06-12 ENCOUNTER — Other Ambulatory Visit: Payer: Self-pay

## 2019-06-12 ENCOUNTER — Encounter (HOSPITAL_BASED_OUTPATIENT_CLINIC_OR_DEPARTMENT_OTHER): Payer: Medicare HMO | Attending: Internal Medicine | Admitting: Internal Medicine

## 2019-06-12 DIAGNOSIS — M272 Inflammatory conditions of jaws: Secondary | ICD-10-CM | POA: Insufficient documentation

## 2019-06-12 DIAGNOSIS — Z87891 Personal history of nicotine dependence: Secondary | ICD-10-CM | POA: Insufficient documentation

## 2019-06-12 DIAGNOSIS — Z923 Personal history of irradiation: Secondary | ICD-10-CM | POA: Diagnosis not present

## 2019-06-12 DIAGNOSIS — I2510 Atherosclerotic heart disease of native coronary artery without angina pectoris: Secondary | ICD-10-CM | POA: Insufficient documentation

## 2019-06-12 DIAGNOSIS — I10 Essential (primary) hypertension: Secondary | ICD-10-CM | POA: Insufficient documentation

## 2019-06-12 DIAGNOSIS — Z931 Gastrostomy status: Secondary | ICD-10-CM | POA: Diagnosis not present

## 2019-06-12 DIAGNOSIS — Z85828 Personal history of other malignant neoplasm of skin: Secondary | ICD-10-CM | POA: Insufficient documentation

## 2019-06-12 DIAGNOSIS — Z951 Presence of aortocoronary bypass graft: Secondary | ICD-10-CM | POA: Diagnosis not present

## 2019-06-13 ENCOUNTER — Other Ambulatory Visit (HOSPITAL_COMMUNITY): Payer: Self-pay | Admitting: Internal Medicine

## 2019-06-13 ENCOUNTER — Ambulatory Visit (HOSPITAL_COMMUNITY)
Admission: RE | Admit: 2019-06-13 | Discharge: 2019-06-13 | Disposition: A | Discharge status: Home / Self Care | Payer: Medicare HMO | Source: Ambulatory Visit | Attending: Internal Medicine | Admitting: Internal Medicine

## 2019-06-13 DIAGNOSIS — Z9289 Personal history of other medical treatment: Secondary | ICD-10-CM | POA: Diagnosis not present

## 2019-06-13 DIAGNOSIS — R918 Other nonspecific abnormal finding of lung field: Secondary | ICD-10-CM | POA: Diagnosis not present

## 2019-06-17 DIAGNOSIS — L03311 Cellulitis of abdominal wall: Secondary | ICD-10-CM | POA: Diagnosis not present

## 2019-06-17 DIAGNOSIS — Z951 Presence of aortocoronary bypass graft: Secondary | ICD-10-CM | POA: Diagnosis not present

## 2019-06-17 DIAGNOSIS — F17200 Nicotine dependence, unspecified, uncomplicated: Secondary | ICD-10-CM | POA: Diagnosis not present

## 2019-06-17 DIAGNOSIS — I25810 Atherosclerosis of coronary artery bypass graft(s) without angina pectoris: Secondary | ICD-10-CM | POA: Diagnosis not present

## 2019-06-17 DIAGNOSIS — K9423 Gastrostomy malfunction: Secondary | ICD-10-CM | POA: Diagnosis not present

## 2019-06-17 DIAGNOSIS — K219 Gastro-esophageal reflux disease without esophagitis: Secondary | ICD-10-CM | POA: Diagnosis not present

## 2019-06-17 DIAGNOSIS — Z7982 Long term (current) use of aspirin: Secondary | ICD-10-CM | POA: Diagnosis not present

## 2019-06-17 DIAGNOSIS — I10 Essential (primary) hypertension: Secondary | ICD-10-CM | POA: Diagnosis not present

## 2019-06-17 DIAGNOSIS — Z9884 Bariatric surgery status: Secondary | ICD-10-CM | POA: Diagnosis not present

## 2019-06-18 DIAGNOSIS — R627 Adult failure to thrive: Secondary | ICD-10-CM | POA: Diagnosis not present

## 2019-06-18 DIAGNOSIS — C44329 Squamous cell carcinoma of skin of other parts of face: Secondary | ICD-10-CM | POA: Diagnosis not present

## 2019-06-18 DIAGNOSIS — R1312 Dysphagia, oropharyngeal phase: Secondary | ICD-10-CM | POA: Diagnosis not present

## 2019-06-22 ENCOUNTER — Other Ambulatory Visit: Payer: Self-pay | Admitting: Cardiovascular Disease

## 2019-06-27 ENCOUNTER — Other Ambulatory Visit: Payer: Self-pay

## 2019-06-27 ENCOUNTER — Encounter (HOSPITAL_BASED_OUTPATIENT_CLINIC_OR_DEPARTMENT_OTHER): Payer: Medicare HMO | Attending: Internal Medicine | Admitting: Internal Medicine

## 2019-06-27 DIAGNOSIS — M272 Inflammatory conditions of jaws: Secondary | ICD-10-CM | POA: Insufficient documentation

## 2019-06-27 DIAGNOSIS — Z859 Personal history of malignant neoplasm, unspecified: Secondary | ICD-10-CM | POA: Insufficient documentation

## 2019-06-27 DIAGNOSIS — Z923 Personal history of irradiation: Secondary | ICD-10-CM | POA: Diagnosis not present

## 2019-06-27 NOTE — Progress Notes (Signed)
JAHLIEL, GARSIDE ([id number]) Visit Report for 06/27/2019 SuperBill Details Patient Name: Date of Service: ARDITH, MULET 06/27/2019 Medical Record [id number] Patient Account Number: [id number] Date of Birth/Sex: Treating RN: 1946-04-15 (73 y.o. 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

## 2019-06-27 NOTE — Progress Notes (Addendum)
[name], [name] ([id number]) Visit Report for 06/27/2019 HBO Details Patient Name: Date of Service: [name], [name] 06/27/2019 10:00 AM Medical Record [id number] Patient Account Number: [id number] Date of Birth/Sex: Treating RN: 1945-11-09 (73 y.o. 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 HBO Treatment Details Treatment Number: 1 Patient Type: Outpatient Chamber Type: Monoplace Chamber Serial #: [id number] Treatment Protocol: 2.5 ATA with 90 minutes oxygen, with two 5 minute air breaks Treatment Details Compression Rate Down: 2.0 psi / minute De-Compression Rate Up: 2.0 psi / minute Air breaks and CompressTx Pressure breathing periods DecompressDecompress Begins Reached (leave unused spaces Begins Ends blank) Chamber Pressure (ATA)1 2.5 2.5 2.5 2.5 2.5 --2.5 1 Clock Time (24 hr) 10:28 10:40 11:1011:1511:4511:50--12:20 12:32 Treatment Length: 124 (minutes) Treatment Segments: 4 Vital Signs Capillary Blood Glucose Reference Range: 80 - 120 mg / dl HBO Diabetic Blood Glucose Intervention Range: <131 mg/dl or >034 mg/dl Time Vitals Blood Respiratory Capillary Blood Glucose Pulse Action Type: Pulse: Temperature: Taken: Pressure: Rate: Glucose (mg/dl): Meter #: Oximetry (%) Taken: Pre 09:50 108/73 52 15 97.8 Post 12:35 153/57 52 17 98.4 Treatment Response Treatment Toleration: Well Treatment Completion Treatment Completed without Adverse Event Status: Abria Vannostrand Notes No concerns with treatment given. The patient was seen for his initial upper back treatment evaluation. He has bilateral myringotomy tubes. 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 The myringotomy tubes appear to be in good position. Respiratory exam was clear heart sounds normal. Physician HBO Attestation: I certify that I supervised this HBO treatment in accordance with Medicare guidelines. A trained Yes emergency response team is readily available per hospital policies and procedures. Continue HBOT as ordered. Yes Electronic Signature(s) Signed: 06/27/2019 5:17:49 PM By: Baltazar Najjar MD Previous Signature: 06/27/2019 4:05:15 PM Version By: Benjaman Kindler EMT/HBOT Entered By: Baltazar Najjar on 06/27/2019 16:42:31 -------------------------------------------------------------------------------- HBO Safety Checklist Details Patient Name: Date of Service: [name] 06/27/2019 10:00 AM Medical Record [id number] Patient Account Number: [id number] Date of Birth/Sex: Treating RN: [birth year] (73 y.o. [name]) Yevonne Pax Primary Care Mirenda Baltazar: SYSTEM, PCP Other Clinician: Referring Mikhayla Phillis: Treating Raiquan Chandler/Extender:Robson, Lamar Sprinkles in Treatment: 2 HBO Safety Checklist Items Safety Checklist Consent Form Signed Patient voided / foley secured and emptied When did you last eato n/a Last dose of injectable or oral agent n/a NA Ostomy pouch emptied and vented if applicable NA All implantable devices assessed, documented and approved NA Intravenous access site secured and place Valuables secured Linens and cotton and cotton/polyester blend (less than 51% polyester) Personal oil-based products / skin lotions / body lotions removed NA Wigs or hairpieces removed NA Smoking or tobacco materials removed Books / newspapers / magazines / loose paper removed Cologne, aftershave, perfume and deodorant removed Jewelry removed (may wrap wedding band) NA Make-up removed Hair care products removed NA Battery operated devices (external) removed NA Heating patches and chemical warmers removed NA  Titanium eyewear removed NA Nail polish cured greater than 10 hours NA Casting material cured greater than 10 hours NA Hearing aids removed NA Loose dentures or partials removed NA Prosthetics have been removed Patient demonstrates correct use of air break device (if applicable) Patient concerns have been addressed Patient grounding bracelet on and cord attached to chamber Specifics for  Inpatients (complete in addition to above) Medication sheet sent with patient Intravenous medications needed or due during therapy sent with patient Drainage tubes (e.g. nasogastric tube or chest tube secured and vented) Endotracheal or Tracheotomy tube secured Cuff deflated of air and inflated with saline Airway suctioned Electronic Signature(s) Signed: 06/27/2019 12:04:23 PM By: Benjaman Kindler EMT/HBOT Entered By: Benjaman Kindler on 06/27/2019 12:04:22

## 2019-06-27 NOTE — Progress Notes (Signed)
LEIBISH, PAYNTER ([id number]) Visit Report for 06/27/2019 Arrival Information Details Patient Name: Date of Service: [name], [name] 06/27/2019 10:00 AM Medical Record [id number] Patient Account Number: [id number] Date of Birth/Sex: Treating RN: May 23, 1946 (73 y.o. [name]) Carlene Coria Primary Care Nalleli Largent: SYSTEM, PCP Other Clinician: Referring Kimbrely Buckel: Treating Rayne Cowdrey/Extender:Robson, [name] in Treatment: 2 Visit Information History Since Last Visit Added or deleted any medications: No Patient Arrived: Ambulatory Any new allergies or adverse reactions: No Arrival Time: 09:45 Had a fall or experienced change in No Accompanied By: self activities of daily living that may affect Transfer Assistance: None risk of falls: Patient Identification Verified: Yes Signs or symptoms of abuse/neglect since last No Secondary Verification Process Yes visito Completed: Hospitalized since last visit: No Patient Requires Transmission-Based No Implantable device outside of the clinic excluding No Precautions: cellular tissue based products placed in the center Patient Has Alerts: No since last visit: Pain Present Now: No Electronic Signature(s) Signed: 06/27/2019 4:05:15 PM By: [name] EMT/HBOT Entered By: [name] on 06/27/2019 12:03:13 -------------------------------------------------------------------------------- Encounter Discharge Information Details Patient Name: Date of Service: [name]. 06/27/2019 10:00 AM Medical Record [id number] Patient Account Number: [id number] Date of Birth/Sex: Treating RN: 1945/12/18 (73 y.o. [name]) Carlene Coria Primary Care Kishana Battey: SYSTEM, PCP Other Clinician: Referring Jaquavion Mccannon: Treating Mearl Harewood/Extender:Robson, [name] in Treatment: 2 Encounter Discharge Information Items Discharge Condition: Stable Ambulatory Status: Ambulatory Discharge Destination: Home Transportation: Private Auto Accompanied By:  self Schedule Follow-up Appointment: Yes Clinical Summary of Care: Patient Declined Electronic Signature(s) Signed: 06/27/2019 4:05:15 PM By: [name] EMT/HBOT Entered By: [name] on 06/27/2019 13:59:23 -------------------------------------------------------------------------------- Patient/Caregiver Education Details Patient Name: [name] 11/10/2020andnbsp10:00 Date of Service: AM Medical Record [id number] Number: Patient Account Number: [id number] Treating RN: [birth year] (73 y.o. Carlene Coria Date of Birth/Gender: M) Other Clinician: Primary Care Treating SYSTEM, PCP [name] Physician: Physician/Extender: Referring Physician: [name] in Treatment: 2 Education Assessment Education Provided To: Patient Education Topics Provided Hyperbaric Oxygenation: Methods: Explain/Verbal Responses: State content correctly Electronic Signature(s) Signed: 06/27/2019 4:05:15 PM By: [name] EMT/HBOT Entered By: [name] on 06/27/2019 13:59:11 -------------------------------------------------------------------------------- Vitals Details Patient Name: Date of Service: [name]. 06/27/2019 10:00 AM Medical Record [id number] Patient Account Number: [id number] Date of Birth/Sex: Treating RN: 10/20/1945 (73 y.o. [name]) Carlene Coria Primary Care Placido Hangartner: SYSTEM, PCP Other Clinician: Referring Aaronjames Kelsay: Treating Kaceton Vieau/Extender:Robson, [name] in Treatment: 2 Vital Signs Time Taken: 09:50 Temperature (F): 97.8 Height (in): 70 Pulse (bpm): 52 Weight (lbs): 152 Respiratory Rate (breaths/min): 15 Body Mass Index (BMI): 21.8 Blood Pressure (mmHg): 108/73 Reference Range: 80 - 120 mg / dl Electronic Signature(s) Signed: 06/27/2019 4:05:15 PM By: [name] EMT/HBOT Entered By: [name] on 06/27/2019 12:03:33

## 2019-06-28 ENCOUNTER — Encounter (HOSPITAL_BASED_OUTPATIENT_CLINIC_OR_DEPARTMENT_OTHER): Payer: Medicare HMO | Admitting: Physician Assistant

## 2019-06-28 DIAGNOSIS — Z859 Personal history of malignant neoplasm, unspecified: Secondary | ICD-10-CM | POA: Diagnosis not present

## 2019-06-28 DIAGNOSIS — M272 Inflammatory conditions of jaws: Secondary | ICD-10-CM | POA: Diagnosis not present

## 2019-06-28 DIAGNOSIS — Z923 Personal history of irradiation: Secondary | ICD-10-CM | POA: Diagnosis not present

## 2019-06-28 NOTE — Progress Notes (Signed)
KAELAN, MANDALA ([id number]) Visit Report for 06/28/2019 Arrival Information Details Patient Name: Date of Service: [name], [name] 06/28/2019 10:00 AM Medical Record [id number] Patient Account Number: [id number] Date of Birth/Sex: Treating RN: Dec 27, 1945 (73 y.o. [name] Primary Care Raunak Antuna: SYSTEM, PCP Other Clinician: Referring Zakari Bathe: Treating Carmelia Tiner/Extender:[name], [name] in Treatment: 2 Visit Information History Since Last Visit Added or deleted any medications: No Patient Arrived: Ambulatory Any new allergies or adverse reactions: No Arrival Time: 12:55 Had a fall or experienced change in No Accompanied By: self activities of daily living that may affect Transfer Assistance: None risk of falls: Patient Identification Verified: Yes Signs or symptoms of abuse/neglect since last No Secondary Verification Process Yes visito Completed: Hospitalized since last visit: No Patient Requires Transmission-Based No Implantable device outside of the clinic excluding No Precautions: cellular tissue based products placed in the center Patient Has Alerts: No since last visit: Pain Present Now: No Electronic Signature(s) Signed: 06/28/2019 4:27:20 PM By: [name] EMT/HBOT Entered By: [name] on 06/28/2019 14:00:23 -------------------------------------------------------------------------------- Encounter Discharge Information Details Patient Name: Date of Service: [name]. 06/28/2019 10:00 AM Medical Record [id number] Patient Account Number: [id number] Date of Birth/Sex: Treating RN: 22-Mar-1946 (73 y.o. [name] Primary Care Ettel Albergo: SYSTEM, PCP Other Clinician: Referring Aynslee Mulhall: Treating Anaika Santillano/Extender:[name], [name] in Treatment: 2 Encounter Discharge Information Items Discharge Condition: Stable Ambulatory Status: Ambulatory Discharge Destination: Home Transportation: Private Auto Accompanied By:  self Schedule Follow-up Appointment: Yes Clinical Summary of Care: Patient Declined Electronic Signature(s) Signed: 06/28/2019 4:27:20 PM By: [name] EMT/HBOT Entered By: [name] on 06/28/2019 15:22:59 -------------------------------------------------------------------------------- Patient/Caregiver Education Details Patient Name: [name] 11/11/2020andnbsp10:00 Date of Service: AM Medical Record [id number] Number: Patient Account Number: [id number] Treating RN: [birth year] (73 y.o. [name] Date of Birth/Gender: M) Other Clinician: Primary Care Treating SYSTEM, PCP [name] Physician: Physician/Extender: Referring Physician: [name] in Treatment: 2 Education Assessment Education Provided To: Patient Education Topics Provided Hyperbaric Oxygenation: Methods: Explain/Verbal Responses: State content correctly Electronic Signature(s) Signed: 06/28/2019 4:27:20 PM By: [name] EMT/HBOT Entered By: [name] on 06/28/2019 15:22:49 -------------------------------------------------------------------------------- Vitals Details Patient Name: Date of Service: [name]. 06/28/2019 10:00 AM Medical Record [id number] Patient Account Number: [id number] Date of Birth/Sex: Treating RN: 1945/08/30 (73 y.o. [name] Primary Care Roshun Klingensmith: SYSTEM, PCP Other Clinician: Referring Keegan Ducey: Treating Aleem Elza/Extender:[name], [name] in Treatment: 2 Vital Signs Time Taken: 13:05 Temperature (F): 97.8 Height (in): 70 Pulse (bpm): 59 Weight (lbs): 152 Respiratory Rate (breaths/min): 14 Body Mass Index (BMI): 21.8 Blood Pressure (mmHg): 105/68 Reference Range: 80 - 120 mg / dl Electronic Signature(s) Signed: 06/28/2019 4:27:20 PM By: [name] EMT/HBOT Entered By: [name] on 06/28/2019 14:00:45

## 2019-06-28 NOTE — Progress Notes (Signed)
EWARD, HELOU ([id number]) Visit Report for 06/28/2019 Problem List Details Patient Name: Date of Service: [name], [name] 06/28/2019 10:00 AM Medical Record [id number] Patient Account Number: [id number] Date of Birth/Sex: Treating RN: [birth year] (73 y.o. 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

## 2019-06-28 NOTE — Progress Notes (Addendum)
[name] ([id number]) Visit Report for 06/28/2019 HBO Details Patient Name: Date of Service: [name], [name] 06/28/2019 10:00 AM Medical Record [id number] Patient Account Number: [id number] Date of Birth/Sex: Treating RN: 12-05-1945 (73 y.o. 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 HBO Treatment Details Treatment Number: 2 Patient Type: Outpatient Chamber Type: Monoplace Chamber Serial #: [id number] Treatment Protocol: 2.5 ATA with 90 minutes oxygen, with two 5 minute air breaks Treatment Details Compression Rate Down: 2.0 psi / minute De-Compression Rate Up: 2.0 psi / minute Air breaks and CompressTx Pressure breathing periods DecompressDecompress Begins Reached (leave unused spaces Begins Ends blank) Chamber Pressure (ATA)1 2.5 2.5 2.5 2.5 2.5 --2.5 1 Clock Time (24 hr) 13:04 13:16 13:4613:5114:2114:26--14:56 15:08 Treatment Length: 124 (minutes) Treatment Segments: 4 Vital Signs Capillary Blood Glucose Reference Range: 80 - 120 mg / dl HBO Diabetic Blood Glucose Intervention Range: <131 mg/dl or >324 mg/dl Time Vitals Blood Respiratory Capillary Blood Glucose Pulse Action Type: Pulse: Temperature: Taken: Pressure: Rate: Glucose (mg/dl): Meter #: Oximetry (%) Taken: Pre 13:05 105/68 59 14 97.8 Post 15:10 133/62 51 13 98.2 Treatment Response Treatment Toleration: Well Treatment Completion Treatment Completed without Adverse Event Status: Physician HBO Attestation: I certify that I supervised this HBO treatment in accordance with Medicare guidelines. A trained Yes emergency response team is readily available per hospital  policies and procedures. Continue HBOT as ordered. Yes Electronic Signature(s) Signed: 06/28/2019 5:02:14 PM By: Lenda Kelp PA-C Previous Signature: 06/28/2019 4:27:20 PM Version By: Benjaman Kindler EMT/HBOT Entered By: Lenda Kelp on 06/28/2019 17:01:01 -------------------------------------------------------------------------------- HBO Safety Checklist Details Patient Name: Date of Service: [name]. 06/28/2019 10:00 AM Medical Record [id number] Patient Account Number: [id number] Date of Birth/Sex: Treating RN: 1945/09/09 (73 y.o. [name] Primary Care Kron Everton: SYSTEM, PCP Other Clinician: Referring Mykale Gandolfo: Treating Fin Hupp/Extender:[name], Jake Samples in Treatment: 2 HBO Safety Checklist Items Safety Checklist Consent Form Signed Patient voided / foley secured and emptied When did you last eato n/a Last dose of injectable or oral agent n/a NA Ostomy pouch emptied and vented if applicable NA All implantable devices assessed, documented and approved NA Intravenous access site secured and place Valuables secured Linens and cotton and cotton/polyester blend (less than 51% polyester) Personal oil-based products / skin lotions / body lotions removed NA Wigs or hairpieces removed NA Smoking or tobacco materials removed Books / newspapers / magazines / loose paper removed Cologne, aftershave, perfume and deodorant removed Jewelry removed (may wrap wedding band) NA Make-up removed Hair care products removed NA Battery operated devices (external) removed NA Heating patches and chemical warmers removed NA Titanium eyewear removed NA Nail polish cured greater than 10 hours NA Casting material cured greater than 10 hours NA Hearing aids removed NA Loose dentures or partials removed NA Prosthetics have been removed Patient demonstrates correct use of air break device (if applicable) Patient concerns have been addressed Patient grounding bracelet  on and cord attached to chamber Specifics for Inpatients (complete in addition to above) Medication sheet sent with patient Intravenous medications needed or due during therapy sent with patient Drainage tubes (e.g. nasogastric tube or chest tube secured and vented) Endotracheal or Tracheotomy tube secured Cuff deflated of air and inflated with saline Airway suctioned Electronic Signature(s) Signed: 06/28/2019  2:01:27 PM By: Benjaman Kindler EMT/HBOT Entered By: Benjaman Kindler on 06/28/2019 14:01:26

## 2019-06-29 ENCOUNTER — Other Ambulatory Visit: Payer: Self-pay

## 2019-06-29 ENCOUNTER — Encounter (HOSPITAL_BASED_OUTPATIENT_CLINIC_OR_DEPARTMENT_OTHER): Payer: Medicare HMO | Admitting: Internal Medicine

## 2019-06-29 DIAGNOSIS — M272 Inflammatory conditions of jaws: Secondary | ICD-10-CM | POA: Diagnosis not present

## 2019-06-29 DIAGNOSIS — Z923 Personal history of irradiation: Secondary | ICD-10-CM | POA: Diagnosis not present

## 2019-06-29 DIAGNOSIS — Z859 Personal history of malignant neoplasm, unspecified: Secondary | ICD-10-CM | POA: Diagnosis not present

## 2019-06-29 NOTE — Progress Notes (Addendum)
BADER, LELLO ([id number]) Visit Report for 06/29/2019 HBO Details Patient Name: Date of Service: [name], [name] 06/29/2019 10:00 AM Medical Record [id number] Patient Account Number: [id number] Date of Birth/Sex: Treating RN: 08-06-1946 (73 y.o. 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 HBO Treatment Details Treatment Number: 3 Patient Type: Outpatient Chamber Type: Monoplace Chamber Serial #: [id number] Treatment Protocol: 2.5 ATA with 90 minutes oxygen, with two 5 minute air breaks Treatment Details Compression Rate Down: 2.0 psi / minute De-Compression Rate Up: 2.0 psi / minute Air breaks and CompressTx Pressure breathing periods DecompressDecompress Begins Reached (leave unused spaces Begins Ends blank) Chamber Pressure (ATA)1 2.5 2.5 2.5 2.5 2.5 --2.5 1 Clock Time (24 hr) 13:04 13:16 13:4613:5114:2114:26--14:56 15:08 Treatment Length: 124 (minutes) Treatment Segments: 4 Vital Signs Capillary Blood Glucose Reference Range: 80 - 120 mg / dl HBO Diabetic Blood Glucose Intervention Range: <131 mg/dl or >662 mg/dl Time Vitals Blood Respiratory Capillary Blood Glucose Pulse Action Type: Pulse: Temperature: Taken: Pressure: Rate: Glucose (mg/dl): Meter #: Oximetry (%) Taken: Pre 12:55 135/60 56 14 98 Post 15:10 141/74 55 15 98.2 Treatment Response Treatment Toleration: Well Treatment Completion Treatment Completed without Adverse Event Status: Lorell Thibodaux Notes No concerns with treatment given Physician HBO Attestation: I certify that I supervised this HBO treatment in accordance with Medicare guidelines. A trained Yes Yes emergency  response team is readily available per hospital policies and procedures. Continue HBOT as ordered. Yes Electronic Signature(s) Signed: 06/29/2019 5:47:05 PM By: Baltazar Najjar MD Previous Signature: 06/29/2019 3:27:21 PM Version By: Benjaman Kindler EMT/HBOT Entered By: Baltazar Najjar on 06/29/2019 17:45:21 -------------------------------------------------------------------------------- HBO Safety Checklist Details Patient Name: Date of Service: [name]. 06/29/2019 10:00 AM Medical Record [id number] Patient Account Number: [id number] Date of Birth/Sex: Treating RN: [birth year] (73 y.o. Tammy Sours Primary Care Keshan Reha: SYSTEM, PCP Other Clinician: Referring Luiza Carranco: Treating Mycala Warshawsky/Extender:Robson, Lamar Sprinkles in Treatment: 2 HBO Safety Checklist Items Safety Checklist Consent Form Signed Patient voided / foley secured and emptied When did you last eato n/a Last dose of injectable or oral agent n/a NA Ostomy pouch emptied and vented if applicable NA All implantable devices assessed, documented and approved NA Intravenous access site secured and place Valuables secured Linens and cotton and cotton/polyester blend (less than 51% polyester) Personal oil-based products / skin lotions / body lotions removed NA Wigs or hairpieces removed NA Smoking or tobacco materials removed Books / newspapers / magazines / loose paper removed Cologne, aftershave, perfume and deodorant removed Jewelry removed (may wrap wedding band) NA Make-up removed Hair care products removed NA Battery operated devices (external) removed NA Heating patches and chemical warmers removed NA Titanium eyewear removed NA Nail polish cured greater than 10 hours NA Casting material cured greater than 10 hours NA Hearing aids removed NA Loose dentures or partials removed NA Prosthetics have been removed Patient demonstrates correct use of air break device (if applicable) Patient concerns have  been addressed Patient grounding bracelet on and cord attached to chamber Specifics for Inpatients (complete in addition to above) Medication sheet sent with patient Intravenous medications needed or due during therapy sent with patient Drainage tubes (e.g. nasogastric tube or chest tube secured and vented) Endotracheal or Tracheotomy tube secured Cuff deflated of air and inflated with saline Airway suctioned  Electronic Signature(s) Signed: 06/29/2019 1:39:48 PM By: Benjaman Kindler EMT/HBOT Entered By: Benjaman Kindler on 06/29/2019 13:39:47

## 2019-06-29 NOTE — Progress Notes (Signed)
LENVILLE, RINKE ([id number]) Visit Report for 06/29/2019 Arrival Information Details Patient Name: Date of Service: [name], [name] 06/29/2019 10:00 AM Medical Record [id number] Patient Account Number: [id number] Date of Birth/Sex: Treating RN: Dec 19, 1945 (73 y.o. Hessie Diener Primary Care Betzaida Cremeens: SYSTEM, PCP Other Clinician: Referring Lavontae Cornia: Treating Delania Ferg/Extender:Robson, [name] in Treatment: 2 Visit Information History Since Last Visit Added or deleted any medications: No Patient Arrived: Ambulatory Any new allergies or adverse reactions: No Arrival Time: 12:50 Had a fall or experienced change in No Accompanied By: self activities of daily living that may affect Transfer Assistance: None risk of falls: Patient Identification Verified: Yes Signs or symptoms of abuse/neglect since last No Secondary Verification Process Yes visito Completed: Hospitalized since last visit: No Patient Requires Transmission-Based No Implantable device outside of the clinic excluding No Precautions: cellular tissue based products placed in the center Patient Has Alerts: No since last visit: Pain Present Now: No Electronic Signature(s) Signed: 06/29/2019 3:27:21 PM By: [name] EMT/HBOT Entered By: [name] on 06/29/2019 13:39:00 -------------------------------------------------------------------------------- Encounter Discharge Information Details Patient Name: Date of Service: [name]. 06/29/2019 10:00 AM Medical Record [id number] Patient Account Number: [id number] Date of Birth/Sex: Treating RN: Feb 26, 1946 (73 y.o. Hessie Diener Primary Care Cassity Christian: SYSTEM, PCP Other Clinician: Referring Annalia Metzger: Treating Jazz Rogala/Extender:Robson, [name] in Treatment: 2 Encounter Discharge Information Items Discharge Condition: Stable Ambulatory Status: Ambulatory Discharge Destination: Home Transportation: Private Auto Accompanied By:  self Schedule Follow-up Appointment: Yes Clinical Summary of Care: Patient Declined Electronic Signature(s) Signed: 06/29/2019 3:27:21 PM By: [name] EMT/HBOT Entered By: [name] on 06/29/2019 15:25:40 -------------------------------------------------------------------------------- Patient/Caregiver Education Details Patient Name: [name] 11/12/2020andnbsp10:00 Date of Service: AM Medical Record [id number] Number: Patient Account Number: [id number] Treating RN: January 17, 1946 (73 y.o. Deon Pilling Date of Birth/Gender: M) Other Clinician: Primary Care Treating SYSTEM, PCP [name] Physician: Physician/Extender: Referring Physician: [name] in Treatment: 2 Education Assessment Education Provided To: Patient Education Topics Provided Hyperbaric Oxygenation: Methods: Explain/Verbal Responses: State content correctly Electronic Signature(s) Signed: 06/29/2019 3:27:21 PM By: [name] EMT/HBOT Entered By: [name] on 06/29/2019 15:25:29 -------------------------------------------------------------------------------- Vitals Details Patient Name: Date of Service: [name]. 06/29/2019 10:00 AM Medical Record [id number] Patient Account Number: [id number] Date of Birth/Sex: Treating RN: Dec 27, 1945 (73 y.o. Hessie Diener Primary Care Jaydenn Boccio: SYSTEM, PCP Other Clinician: Referring Rajvir Ernster: Treating Jerry Haugen/Extender:Robson, [name] in Treatment: 2 Vital Signs Time Taken: 12:55 Temperature (F): 98 Height (in): 70 Pulse (bpm): 56 Weight (lbs): 152 Respiratory Rate (breaths/min): 14 Body Mass Index (BMI): 21.8 Blood Pressure (mmHg): 135/60 Reference Range: 80 - 120 mg / dl Electronic Signature(s) Signed: 06/29/2019 3:27:21 PM By: [name] EMT/HBOT Entered By: [name] on 06/29/2019 13:39:12

## 2019-06-29 NOTE — Progress Notes (Signed)
HUSSEIN, SPILLMAN ([id number]) Visit Report for 06/29/2019 SuperBill Details Patient Name: Date of Service: HOBERT, SCHAUM 06/29/2019 Medical Record [id number] Patient Account Number: [id number] Date of Birth/Sex: Treating RN: 27-Apr-1946 (73 y.o. 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

## 2019-06-30 ENCOUNTER — Encounter (HOSPITAL_BASED_OUTPATIENT_CLINIC_OR_DEPARTMENT_OTHER): Payer: Medicare HMO | Admitting: Internal Medicine

## 2019-06-30 DIAGNOSIS — M272 Inflammatory conditions of jaws: Secondary | ICD-10-CM | POA: Diagnosis not present

## 2019-06-30 DIAGNOSIS — Z923 Personal history of irradiation: Secondary | ICD-10-CM | POA: Diagnosis not present

## 2019-06-30 DIAGNOSIS — Z859 Personal history of malignant neoplasm, unspecified: Secondary | ICD-10-CM | POA: Diagnosis not present

## 2019-06-30 NOTE — Progress Notes (Signed)
KAMERION, MCINTEE ([id number]) Visit Report for 06/30/2019 SuperBill Details Patient Name: Date of Service: [name], [name] 06/30/2019 Medical Record [id number] Patient Account Number: [id number] Date of Birth/Sex: Treating RN: [birth year] (73 y.o. 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

## 2019-06-30 NOTE — Progress Notes (Signed)
EARLY, KONDOR ([id number]) Visit Report for 06/30/2019 Arrival Information Details Patient Name: Date of Service: [name] 06/30/2019 1:00 PM Medical Record [id number] Patient Account Number: [id number] Date of Birth/Sex: Treating RN: 03-14-1946 (73 y.o. [name] Primary Care Inola Lisle: SYSTEM, PCP Other Clinician: Referring Mel Langan: Treating Deshay Kirstein/Extender:Robson, [name] in Treatment: 2 Visit Information History Since Last Visit Added or deleted any medications: No Patient Arrived: Ambulatory Any new allergies or adverse reactions: No Arrival Time: 13:00 Had a fall or experienced change in No Accompanied By: self activities of daily living that may affect Transfer Assistance: None risk of falls: Patient Identification Verified: Yes Signs or symptoms of abuse/neglect since last No Secondary Verification Process Yes visito Completed: Hospitalized since last visit: No Patient Requires Transmission-Based No Implantable device outside of the clinic excluding No Precautions: cellular tissue based products placed in the center Patient Has Alerts: No since last visit: Pain Present Now: No Electronic Signature(s) Signed: 06/30/2019 4:02:07 PM By: [name] EMT/HBOT Entered By: [name] on 06/30/2019 13:59:29 -------------------------------------------------------------------------------- Encounter Discharge Information Details Patient Name: Date of Service: [name]. 06/30/2019 1:00 PM Medical Record [id number] Patient Account Number: [id number] Date of Birth/Sex: Treating RN: Jul 17, 1946 (73 y.o. [name] Primary Care Turrell Severt: SYSTEM, PCP Other Clinician: Referring Ryn Peine: Treating Rielly Brunn/Extender:Robson, [name] in Treatment: 2 Encounter Discharge Information Items Discharge Condition: Stable Ambulatory Status: Ambulatory Discharge Destination: Home Transportation: Private Auto Accompanied  By: self Schedule Follow-up Appointment: Yes Clinical Summary of Care: Patient Declined Electronic Signature(s) Signed: 06/30/2019 4:02:07 PM By: [name] EMT/HBOT Entered By: [name] on 06/30/2019 16:01:45 -------------------------------------------------------------------------------- Patient/Caregiver Education Details Patient Name: [name] 11/13/2020andnbsp1:00 Date of Service: PM Medical Record [id number] Number: Patient Account Number: [id number] Treating RN: 17-Oct-1945 (73 y.o. [name] Date of Birth/Gender: M) Other Clinician: Primary Care Physician: SYSTEM, PCP Treating [name] Referring Physician: Physician/Extender: [name] in Treatment: 2 Education Assessment Education Provided To: Patient Education Topics Provided Hyperbaric Oxygenation: Methods: Explain/Verbal Responses: State content correctly Electronic Signature(s) Signed: 06/30/2019 4:02:07 PM By: [name] EMT/HBOT Entered By: [name] on 06/30/2019 16:01:36 -------------------------------------------------------------------------------- Vitals Details Patient Name: Date of Service: [name]. 06/30/2019 1:00 PM Medical Record [id number] Patient Account Number: [id number] Date of Birth/Sex: Treating RN: 26-Feb-1946 (73 y.o. [name] Primary Care [name]: SYSTEM, PCP Other Clinician: Referring Derren Suydam: Treating Aaralynn Shepheard/Extender:Robson, [name] in Treatment: 2 Vital Signs Time Taken: 13:05 Temperature (F): 98.3 Height (in): 70 Pulse (bpm): 57 Weight (lbs): 152 Respiratory Rate (breaths/min): 13 Body Mass Index (BMI): 21.8 Blood Pressure (mmHg): 140/60 Reference Range: 80 - 120 mg / dl Electronic Signature(s) Signed: 06/30/2019 4:02:07 PM By: [name] EMT/HBOT Entered By: [name] on 06/30/2019 13:59:43

## 2019-06-30 NOTE — Progress Notes (Addendum)
KAHL, RUPLEY ([id number]) Visit Report for 06/30/2019 HBO Details Patient Name: Date of Service: [name], [name] 06/30/2019 1:00 PM Medical Record [id number] Patient Account Number: [id number] Date of Birth/Sex: Treating RN: [birth year] (73 y.o. 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 HBO Treatment Details Treatment Number: 4 Patient Type: Outpatient Chamber Type: Monoplace Chamber Serial #: [id number] Treatment Protocol: 2.5 ATA with 90 minutes oxygen, with two 5 minute air breaks Treatment Details Compression Rate Down: 2.0 psi / minute De-Compression Rate Up: 2.0 psi / minute Air breaks and CompressTx Pressure breathing periods DecompressDecompress Begins Reached (leave unused spaces Begins Ends blank) Chamber Pressure (ATA)1 2.5 2.5 2.5 2.5 2.5 --2.5 1 Clock Time (24 hr) 13:06 13:18 13:4813:5314:2314:28--14:58 15:10 Treatment Length: 124 (minutes) Treatment Segments: 4 Vital Signs Capillary Blood Glucose Reference Range: 80 - 120 mg / dl HBO Diabetic Blood Glucose Intervention Range: <131 mg/dl or >696 mg/dl Time Vitals Blood Respiratory Capillary Blood Glucose Pulse Action Type: Pulse: Temperature: Taken: Pressure: Rate: Glucose (mg/dl): Meter #: Oximetry (%) Taken: Pre 13:05 140/60 57 13 98.3 Post 15:15 138/55 51 14 98 Treatment Response Treatment Toleration: Well Treatment Completion Treatment Completed without Adverse Event Status: [name] Notes No concerns with treatment given Physician HBO Attestation: I certify that I supervised this HBO treatment in accordance with Medicare guidelines. A trained Yes Yes emergency  response team is readily available per hospital policies and procedures. Continue HBOT as ordered. Yes Electronic Signature(s) Signed: 06/30/2019 5:33:43 PM By: Baltazar Najjar MD Previous Signature: 06/30/2019 4:02:07 PM Version By: Benjaman Kindler EMT/HBOT Entered By: Baltazar Najjar on 06/30/2019 17:32:16 -------------------------------------------------------------------------------- HBO Safety Checklist Details Patient Name: Date of Service: [name]. 06/30/2019 1:00 PM Medical Record [id number] Patient Account Number: [id number] Date of Birth/Sex: Treating RN: 10/22/1945 (73 y.o. Katherina Right Primary Care Shayonna Ocampo: SYSTEM, PCP Other Clinician: Referring Jillane Po: Treating Monick Rena/Extender:Robson, Lamar Sprinkles in Treatment: 2 HBO Safety Checklist Items Safety Checklist Consent Form Signed Patient voided / foley secured and emptied When did you last eato n/a Last dose of injectable or oral agent n/a NA Ostomy pouch emptied and vented if applicable NA All implantable devices assessed, documented and approved NA Intravenous access site secured and place Valuables secured Linens and cotton and cotton/polyester blend (less than 51% polyester) Personal oil-based products / skin lotions / body lotions removed NA Wigs or hairpieces removed NA Smoking or tobacco materials removed Books / newspapers / magazines / loose paper removed Cologne, aftershave, perfume and deodorant removed Jewelry removed (may wrap wedding band) NA Make-up removed Hair care products removed NA Battery operated devices (external) removed NA Heating patches and chemical warmers removed NA Titanium eyewear removed NA Nail polish cured greater than 10 hours NA Casting material cured greater than 10 hours NA Hearing aids removed NA Loose dentures or partials removed NA Prosthetics have been removed Patient demonstrates correct use of air break device (if applicable) Patient concerns  have been addressed Patient grounding bracelet on and cord attached to chamber Specifics for Inpatients (complete in addition to above) Medication sheet sent with patient Intravenous medications needed or due during therapy sent with patient Drainage tubes (e.g. nasogastric tube or chest tube secured and vented) Endotracheal or Tracheotomy tube secured Cuff deflated of air and inflated with saline Airway suctioned  Electronic Signature(s) Signed: 06/30/2019 2:00:19 PM By: Benjaman Kindler EMT/HBOT Entered By: Benjaman Kindler on 06/30/2019 14:00:18

## 2019-07-03 ENCOUNTER — Encounter (HOSPITAL_BASED_OUTPATIENT_CLINIC_OR_DEPARTMENT_OTHER): Payer: Medicare HMO | Admitting: Internal Medicine

## 2019-07-03 ENCOUNTER — Other Ambulatory Visit: Payer: Self-pay

## 2019-07-03 DIAGNOSIS — Z859 Personal history of malignant neoplasm, unspecified: Secondary | ICD-10-CM | POA: Diagnosis not present

## 2019-07-03 DIAGNOSIS — M272 Inflammatory conditions of jaws: Secondary | ICD-10-CM | POA: Diagnosis not present

## 2019-07-03 DIAGNOSIS — Z923 Personal history of irradiation: Secondary | ICD-10-CM | POA: Diagnosis not present

## 2019-07-03 NOTE — Progress Notes (Addendum)
VILAS, GILLAN ([id number]) Visit Report for 07/03/2019 HBO Details Patient Name: Date of Service: KYRA, SIGONA 07/03/2019 1:00 PM Medical Record [id number] Patient Account Number: [id number] Date of Birth/Sex: Treating RN: [birth year] (73 y.o. 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 HBO Treatment Details Treatment Number: 5 Patient Type: Outpatient Chamber Type: Monoplace Chamber Serial #: [id number] Treatment Protocol: 2.5 ATA with 90 minutes oxygen, with two 5 minute air breaks Treatment Details Compression Rate Down: 2.0 psi / minute De-Compression Rate Up: 2.0 psi / minute Air breaks and CompressTx Pressure breathing periods DecompressDecompress Begins Reached (leave unused spaces Begins Ends blank) Chamber Pressure (ATA)1 2.5 2.5 2.5 2.5 2.5 --2.5 1 Clock Time (24 hr) 13:27 13:39 14:0914:1414:4414:49--15:19 15:31 Treatment Length: 124 (minutes) Treatment Segments: 4 Vital Signs Capillary Blood Glucose Reference Range: 80 - 120 mg / dl HBO Diabetic Blood Glucose Intervention Range: <131 mg/dl or >132 mg/dl Time Vitals Blood Respiratory Capillary Blood Glucose Pulse Action Type: Pulse: Temperature: Taken: Pressure: Rate: Glucose (mg/dl): Meter #: Oximetry (%) Taken: Pre 13:25 108/72 55 14 98 Post 15:33 150/63 50 16 97.9 Treatment Response Treatment Toleration: Well Treatment Completion Treatment Completed without Adverse Event Status: Trinnity Breunig Notes No concerns with treatment given Physician HBO Attestation: I certify that I supervised this HBO treatment in accordance with Medicare guidelines. A trained Yes Yes emergency  response team is readily available per hospital policies and procedures. Continue HBOT as ordered. Yes Electronic Signature(s) Signed: 07/03/2019 5:46:17 PM By: Baltazar Najjar MD Entered By: Baltazar Najjar on 07/03/2019 17:44:13 -------------------------------------------------------------------------------- HBO Safety Checklist Details Patient Name: Date of Service: [name] 07/03/2019 1:00 PM Medical Record [id number] Patient Account Number: [id number] Date of Birth/Sex: Treating RN: Dec 02, 1945 (73 y.o. Elizebeth Koller Primary Care Reiley Keisler: SYSTEM, PCP Other Clinician: Referring Neveah Bang: Treating Deone Omahoney/Extender:Robson, Lamar Sprinkles in Treatment: 3 HBO Safety Checklist Items Safety Checklist Consent Form Signed Patient voided / foley secured and emptied When did you last eato n/a Last dose of injectable or oral agent n/a NA Ostomy pouch emptied and vented if applicable NA All implantable devices assessed, documented and approved NA Intravenous access site secured and place Valuables secured Linens and cotton and cotton/polyester blend (less than 51% polyester) Personal oil-based products / skin lotions / body lotions removed NA Wigs or hairpieces removed NA Smoking or tobacco materials removed Books / newspapers / magazines / loose paper removed Cologne, aftershave, perfume and deodorant removed Jewelry removed (may wrap wedding band) NA Make-up removed Hair care products removed NA Battery operated devices (external) removed NA Heating patches and chemical warmers removed NA Titanium eyewear removed NA Nail polish cured greater than 10 hours NA Casting material cured greater than 10 hours NA Hearing aids removed NA Loose dentures or partials removed NA Prosthetics have been removed Patient demonstrates correct use of air break device (if applicable) Patient concerns have been addressed Patient grounding bracelet on and cord attached to  chamber Specifics for Inpatients (complete in addition to above) Medication sheet sent with patient Intravenous medications needed or due during therapy sent with patient Drainage tubes (e.g. nasogastric tube or chest tube secured and vented) Endotracheal or Tracheotomy tube secured Cuff deflated of air and inflated with saline Airway suctioned Electronic Signature(s) Signed: 07/03/2019 1:43:05 PM By: Benjaman Kindler EMT/HBOT  Entered ByBenjaman Kindler on 07/03/2019 13:43:05

## 2019-07-04 ENCOUNTER — Encounter (HOSPITAL_BASED_OUTPATIENT_CLINIC_OR_DEPARTMENT_OTHER): Payer: Medicare HMO | Admitting: Internal Medicine

## 2019-07-04 DIAGNOSIS — C44329 Squamous cell carcinoma of skin of other parts of face: Secondary | ICD-10-CM | POA: Diagnosis not present

## 2019-07-04 DIAGNOSIS — R1312 Dysphagia, oropharyngeal phase: Secondary | ICD-10-CM | POA: Diagnosis not present

## 2019-07-04 DIAGNOSIS — R627 Adult failure to thrive: Secondary | ICD-10-CM | POA: Diagnosis not present

## 2019-07-04 DIAGNOSIS — Z923 Personal history of irradiation: Secondary | ICD-10-CM | POA: Diagnosis not present

## 2019-07-04 DIAGNOSIS — M272 Inflammatory conditions of jaws: Secondary | ICD-10-CM | POA: Diagnosis not present

## 2019-07-04 DIAGNOSIS — Z859 Personal history of malignant neoplasm, unspecified: Secondary | ICD-10-CM | POA: Diagnosis not present

## 2019-07-04 NOTE — Progress Notes (Signed)
SHAMAR, BURSON ([id number]) Visit Report for 07/03/2019 Arrival Information Details Patient Name: Date of Service: [name], [name] 07/03/2019 1:00 PM Medical Record [id number] Patient Account Number: [id number] Date of Birth/Sex: Treating RN: 1946/07/16 (73 y.o. [name] Primary Care Enes Rokosz: SYSTEM, PCP Other Clinician: Referring Soledad Budreau: Treating Muriah Harsha/Extender:Robson, [name] in Treatment: 3 Visit Information History Since Last Visit Added or deleted any medications: No Patient Arrived: Ambulatory Any new allergies or adverse reactions: No Arrival Time: 13:15 Had a fall or experienced change in No Accompanied By: self activities of daily living that may affect Transfer Assistance: None risk of falls: Patient Identification Verified: Yes Signs or symptoms of abuse/neglect since last No Secondary Verification Process Yes visito Completed: Hospitalized since last visit: No Patient Requires Transmission-Based No Implantable device outside of the clinic excluding No Precautions: cellular tissue based products placed in the center Patient Has Alerts: No since last visit: Pain Present Now: No Electronic Signature(s) Signed: 07/04/2019 4:00:22 PM By: [name] EMT/HBOT Entered By: [name] on 07/03/2019 13:42:16 -------------------------------------------------------------------------------- Encounter Discharge Information Details Patient Name: Date of Service: [name]. 07/03/2019 1:00 PM Medical Record [id number] Patient Account Number: [id number] Date of Birth/Sex: Treating RN: 1945/11/02 (73 y.o. [name] Primary Care Montie Swiderski: SYSTEM, PCP Other Clinician: Referring [name]: Treating Nashea Chumney/Extender:Robson, [name] in Treatment: 3 Encounter Discharge Information Items Discharge Condition: Stable Ambulatory Status: Ambulatory Discharge Destination: Home Transportation: Private Auto Accompanied By:  self Schedule Follow-up Appointment: Yes Clinical Summary of Care: Patient Declined Electronic Signature(s) Signed: 07/04/2019 4:00:22 PM By: [name] EMT/HBOT Entered By: [name] on 07/03/2019 16:06:31 -------------------------------------------------------------------------------- Patient/Caregiver Education Details Patient Name: [name] 11/16/2020andnbsp1:00 Date of Service: PM Medical Record [id number] Number: Patient Account Number: [id number] Treating RN: 1946-07-06 (73 y.o. Levan Hurst Date of Birth/Gender: M) Other Clinician: Primary Care Physician: SYSTEM, PCP Treating [name] Referring Physician: Physician/Extender: [name] in Treatment: 3 Education Assessment Education Provided To: Patient Education Topics Provided Hyperbaric Oxygenation: Methods: Explain/Verbal Responses: State content correctly Electronic Signature(s) Signed: 07/04/2019 4:00:22 PM By: [name] EMT/HBOT Entered By: [name] on 07/03/2019 16:06:20 -------------------------------------------------------------------------------- Vitals Details Patient Name: Date of Service: [name]. 07/03/2019 1:00 PM Medical Record [id number] Patient Account Number: [id number] Date of Birth/Sex: Treating RN: 1945/09/09 (73 y.o. [name] Primary Care Thersia Petraglia: SYSTEM, PCP Other Clinician: Referring Alvaro Aungst: Treating Kandice Schmelter/Extender:Robson, [name] in Treatment: 3 Vital Signs Time Taken: 13:25 Temperature (F): 98 Height (in): 70 Pulse (bpm): 55 Weight (lbs): 152 Respiratory Rate (breaths/min): 14 Body Mass Index (BMI): 21.8 Blood Pressure (mmHg): 108/72 Reference Range: 80 - 120 mg / dl Electronic Signature(s) Signed: 07/04/2019 4:00:22 PM By: [name] EMT/HBOT Entered By: [name] on 07/03/2019 13:42:32

## 2019-07-04 NOTE — Progress Notes (Signed)
JAIKOB, ISHIMOTO ([id number]) Visit Report for 07/03/2019 SuperBill Details Patient Name: Date of Service: MITCH, SHILL[place] 07/03/2019 Medical Record [id number] Patient Account Number: [id number] Date of Birth/Sex: Treating RN: November 25, 1945 (73 y.o. 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

## 2019-07-04 NOTE — Progress Notes (Addendum)
[name], [name] ([id number]) Visit Report for 07/04/2019 HBO Details Patient Name: Date of Service: [name] 07/04/2019 1:00 PM Medical Record [id number] Patient Account Number: [id number] Date of Birth/Sex: Treating RN: 03-15-1946 (73 y.o. 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 HBO Treatment Details Treatment Number: 6 Patient Type: Outpatient Chamber Type: Monoplace Chamber Serial #: [id number] Treatment Protocol: 2.5 ATA with 90 minutes oxygen, with two 5 minute air breaks Treatment Details Compression Rate Down: 2.0 psi / minute De-Compression Rate Up: 2.0 psi / minute Air breaks and CompressTx Pressure breathing periods DecompressDecompress Begins Reached (leave unused spaces Begins Ends blank) Chamber Pressure (ATA)1 2.5 2.5 2.5 2.5 2.5 --2.5 1 Clock Time (24 hr) 13:13 13:25 13:5514:0014:3014:35--15:05 15:17 Treatment Length: 124 (minutes) Treatment Segments: 4 Vital Signs Capillary Blood Glucose Reference Range: 80 - 120 mg / dl HBO Diabetic Blood Glucose Intervention Range: <131 mg/dl or >213 mg/dl Time Vitals Blood Respiratory Capillary Blood Glucose Pulse Action Type: Pulse: Temperature: Taken: Pressure: Rate: Glucose (mg/dl): Meter #: Oximetry (%) Taken: Pre 13:00 122/55 50 14 97.8 Post 15:20 138/68 51 14 98.3 Treatment Response Treatment Toleration: Well Treatment Completion Treatment Completed without Adverse Event Status: Syrianna Schillaci Notes No concerns with treatment given Physician HBO Attestation: I certify that I supervised this HBO treatment in accordance with Medicare guidelines. A trained Yes Yes emergency  response team is readily available per hospital policies and procedures. Continue HBOT as ordered. Yes Electronic Signature(s) Signed: 07/04/2019 6:07:35 PM By: [name] Previous Signature: 07/04/2019 4:00:22 PM Version By: [name] EMT/HBOT Entered By: [name] on 07/04/2019 18:04:53 -------------------------------------------------------------------------------- HBO Safety Checklist Details Patient Name: Date of Service: [name]. 07/04/2019 1:00 PM Medical Record [id number] Patient Account Number: [id number] Date of Birth/Sex: Treating RN: May 06, 1946 (73 y.o. [name] Petit) [name] Primary Care Zayd Bonet: SYSTEM, PCP Other Clinician: Referring Jahmal Dunavant: Treating Rihaan Barrack/Extender:[name] in Treatment: 3 HBO Safety Checklist Items Safety Checklist Consent Form Signed Patient voided / foley secured and emptied When did you last eato n/a Last dose of injectable or oral agent n/a NA Ostomy pouch emptied and vented if applicable NA All implantable devices assessed, documented and approved NA Intravenous access site secured and place Valuables secured Linens and cotton and cotton/polyester blend (less than 51% polyester) Personal oil-based products / skin lotions / body lotions removed NA Wigs or hairpieces removed NA Smoking or tobacco materials removed Books / newspapers / magazines / loose paper removed Cologne, aftershave, perfume and deodorant removed Jewelry removed (may wrap wedding band) NA Make-up removed Hair care products removed NA Battery operated devices (external) removed NA Heating patches and chemical warmers removed NA Titanium eyewear removed NA Nail polish cured greater than 10 hours NA Casting material cured greater than 10 hours NA Hearing aids removed NA Loose dentures or partials removed NA Prosthetics have been removed Patient demonstrates correct use of air break device (if applicable) Patient concerns have  been addressed Patient grounding bracelet on and cord attached to chamber Specifics for Inpatients (complete in addition to above) Medication sheet sent with patient Intravenous medications needed or due during therapy sent with patient Drainage tubes (e.g. nasogastric tube or chest tube secured and vented) Endotracheal or Tracheotomy tube secured Cuff deflated of air and inflated with saline Airway suctioned  Electronic Signature(s) Signed: 07/04/2019 1:49:22 PM By: [name] EMT/HBOT Entered By: [name] on 07/04/2019 13:49:22

## 2019-07-04 NOTE — Progress Notes (Signed)
[name], [name] ([id number]) Visit Report for 07/04/2019 SuperBill Details Patient Name: Date of Service: [name], [name] 07/04/2019 Medical Record [id number] Patient Account Number: [id number] Date of Birth/Sex: Treating RN: 1945-12-17 (73 y.o. 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

## 2019-07-04 NOTE — Progress Notes (Signed)
[name], [name] ([id number]) Visit Report for 07/04/2019 Arrival Information Details Patient Name: Date of Service: [name], [name] 07/04/2019 1:00 PM Medical Record [id number] Patient Account Number: [id number] Date of Birth/Sex: Treating RN: [birth year] (73 y.o. Jerilynn Mages) Carlene Coria Primary Care Vianey Caniglia: SYSTEM, PCP Other Clinician: Referring Trip Cavanagh: Treating Sherie Dobrowolski/Extender:Robson, [name] in Treatment: 3 Visit Information History Since Last Visit Added or deleted any medications: No Patient Arrived: Ambulatory Any new allergies or adverse reactions: No Arrival Time: 12:55 Had a fall or experienced change in No Accompanied By: self activities of daily living that may affect Transfer Assistance: None risk of falls: Patient Identification Verified: Yes Signs or symptoms of abuse/neglect since last No Secondary Verification Process Yes visito Completed: Hospitalized since last visit: No Patient Requires Transmission-Based No Implantable device outside of the clinic excluding No Precautions: cellular tissue based products placed in the center Patient Has Alerts: No since last visit: Pain Present Now: No Electronic Signature(s) Signed: 07/04/2019 4:00:22 PM By: [name] EMT/HBOT Entered By: [name] on 07/04/2019 13:48:32 -------------------------------------------------------------------------------- Encounter Discharge Information Details Patient Name: Date of Service: [name]. 07/04/2019 1:00 PM Medical Record [id number] Patient Account Number: [id number] Date of Birth/Sex: Treating RN: 1946-07-21 (73 y.o. Jerilynn Mages) Carlene Coria Primary Care Kairi Tufo: SYSTEM, PCP Other Clinician: Referring Koriana Stepien: Treating Jessalyn Hinojosa/Extender:Robson, [name] in Treatment: 3 Encounter Discharge Information Items Discharge Condition: Stable Ambulatory Status: Ambulatory Discharge Destination: Home Transportation: Private Auto Accompanied By:  self Schedule Follow-up Appointment: Yes Clinical Summary of Care: Patient Declined Electronic Signature(s) Signed: 07/04/2019 4:00:22 PM By: [name] EMT/HBOT Entered By: [name] on 07/04/2019 15:58:33 -------------------------------------------------------------------------------- Patient/Caregiver Education Details Patient Name: [name] 11/17/2020andnbsp1:00 Date of Service: PM Medical Record [id number] Number: Patient Account Number: [id number] Treating RN: [birth year] (73 y.o. Carlene Coria Date of Birth/Gender: M) Other Clinician: Primary Care Physician: SYSTEM, PCP Treating [name] Referring Physician: Physician/Extender: [name] in Treatment: 3 Education Assessment Education Provided To: Patient Education Topics Provided Hyperbaric Oxygenation: Methods: Explain/Verbal Responses: State content correctly Electronic Signature(s) Signed: 07/04/2019 4:00:22 PM By: [name] EMT/HBOT Entered By: [name] on 07/04/2019 15:58:22 -------------------------------------------------------------------------------- Vitals Details Patient Name: Date of Service: [name]. 07/04/2019 1:00 PM Medical Record [id number] Patient Account Number: [id number] Date of Birth/Sex: Treating RN: 03/17/1946 (73 y.o. Jerilynn Mages) Carlene Coria Primary Care Hosanna Betley: SYSTEM, PCP Other Clinician: Referring Rorik Vespa: Treating Gustavus Haskin/Extender:Robson, [name] in Treatment: 3 Vital Signs Time Taken: 13:00 Temperature (F): 97.8 Height (in): 70 Pulse (bpm): 50 Weight (lbs): 152 Respiratory Rate (breaths/min): 14 Body Mass Index (BMI): 21.8 Blood Pressure (mmHg): 122/55 Reference Range: 80 - 120 mg / dl Electronic Signature(s) Signed: 07/04/2019 4:00:22 PM By: [name] EMT/HBOT Entered By: [name] on 07/04/2019 13:48:44

## 2019-07-05 ENCOUNTER — Encounter (HOSPITAL_BASED_OUTPATIENT_CLINIC_OR_DEPARTMENT_OTHER): Payer: Medicare HMO | Admitting: Physician Assistant

## 2019-07-05 ENCOUNTER — Other Ambulatory Visit: Payer: Self-pay

## 2019-07-05 DIAGNOSIS — M272 Inflammatory conditions of jaws: Secondary | ICD-10-CM | POA: Diagnosis not present

## 2019-07-05 DIAGNOSIS — Z859 Personal history of malignant neoplasm, unspecified: Secondary | ICD-10-CM | POA: Diagnosis not present

## 2019-07-05 DIAGNOSIS — Z923 Personal history of irradiation: Secondary | ICD-10-CM | POA: Diagnosis not present

## 2019-07-05 NOTE — Progress Notes (Signed)
[name], [name] ([id number]) Visit Report for 07/05/2019 Problem List Details Patient Name: Date of Service: [name], [name] 07/05/2019 1:00 PM Medical Record [id number] Patient Account Number: [id number] Date of Birth/Sex: Treating RN: 07/14/1946 (74 y.o. 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

## 2019-07-05 NOTE — Progress Notes (Addendum)
[name] ([id number]) Visit Report for 07/05/2019 HBO Details Patient Name: Date of Service: [name], [name] 07/05/2019 1:00 PM Medical Record [id number] Patient Account Number: [id number] Date of Birth/Sex: Treating RN: 09-18-1945 (73 y.o. 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 HBO Treatment Details Treatment Number: 7 Patient Type: Outpatient Chamber Type: Monoplace Chamber Serial #: [id number] Treatment Protocol: 2.5 ATA with 90 minutes oxygen, with two 5 minute air breaks Treatment Details Compression Rate Down: 2.0 psi / minute De-Compression Rate Up: 2.0 psi / minute Air breaks and CompressTx Pressure breathing periods DecompressDecompress Begins Reached (leave unused spaces Begins Ends blank) Chamber Pressure (ATA)1 2.5 2.5 2.5 2.5 2.5 --2.5 1 Clock Time (24 hr) 13:11 13:23 13:5313:5814:2814:33--15:03 15:15 Treatment Length: 124 (minutes) Treatment Segments: 4 Vital Signs Capillary Blood Glucose Reference Range: 80 - 120 mg / dl HBO Diabetic Blood Glucose Intervention Range: <131 mg/dl or >213 mg/dl Time Vitals Blood Respiratory Capillary Blood Glucose Pulse Action Type: Pulse: Temperature: Taken: Pressure: Rate: Glucose (mg/dl): Meter #: Oximetry (%) Taken: Pre 13:15 142/70 51 15 97.6 Post 15:17 138/69 60 13 98.5 Treatment Response Treatment Toleration: Well Treatment Completion Treatment Completed without Adverse Event Status: Physician HBO Attestation: I certify that I supervised this HBO treatment in accordance with Medicare guidelines. A trained Yes emergency response team is readily available per hospital  policies and procedures. Continue HBOT as ordered. Yes Electronic Signature(s) Signed: 07/05/2019 5:11:42 PM By: [name] Kelp PA-C Previous Signature: 07/05/2019 4:21:10 PM Version By: [name] EMT/HBOT Entered By: [name] on 07/05/2019 16:59:58 -------------------------------------------------------------------------------- HBO Safety Checklist Details Patient Name: Date of Service: [name]. 07/05/2019 1:00 PM Medical Record [id number] Patient Account Number: [id number] Date of Birth/Sex: Treating RN: 1945-10-06 (73 y.o. [name] Primary Care [name]: [name], PCP Other Clinician: Referring Ahlam Piscitelli: Treating Rayfield Beem/Extender:[name] in Treatment: 3 HBO Safety Checklist Items Safety Checklist Consent Form Signed Patient voided / foley secured and emptied When did you last eato n/a Last dose of injectable or oral agent n/a NA Ostomy pouch emptied and vented if applicable NA All implantable devices assessed, documented and approved NA Intravenous access site secured and place Valuables secured Linens and cotton and cotton/polyester blend (less than 51% polyester) Personal oil-based products / skin lotions / body lotions removed NA Wigs or hairpieces removed NA Smoking or tobacco materials removed Books / newspapers / magazines / loose paper removed Cologne, aftershave, perfume and deodorant removed Jewelry removed (may wrap wedding band) NA Make-up removed Hair care products removed NA Battery operated devices (external) removed NA Heating patches and chemical warmers removed NA Titanium eyewear removed NA Nail polish cured greater than 10 hours NA Casting material cured greater than 10 hours NA Hearing aids removed NA Loose dentures or partials removed NA Prosthetics have been removed Patient demonstrates correct use of air break device (if applicable) Patient concerns have been addressed Patient grounding bracelet on  and cord attached to chamber Specifics for Inpatients (complete in addition to above) Medication sheet sent with patient Intravenous medications needed or due during therapy sent with patient Drainage tubes (e.g. nasogastric tube or chest tube secured and vented) Endotracheal or Tracheotomy tube secured Cuff deflated of air and inflated with saline Airway suctioned Electronic Signature(s) Signed: 07/05/2019  1:43:16 PM By: [name] EMT/HBOT Entered By: [name] on 07/05/2019 13:43:16

## 2019-07-05 NOTE — Progress Notes (Signed)
[name], [name] ([id number]) Visit Report for 07/05/2019 Arrival Information Details Patient Name: Date of Service: [name], [name] 07/05/2019 1:00 PM Medical Record [id number] Patient Account Number: [id number] Date of Birth/Sex: Treating RN: 06-Apr-1946 (73 y.o. [name] Primary Care [name]: SYSTEM, PCP Other Clinician: Referring [name]: Treating [name]/Extender:[name], [name] in Treatment: 3 Visit Information History Since Last Visit Added or deleted any medications: No Patient Arrived: Ambulatory Any new allergies or adverse reactions: No Arrival Time: 13:10 Had a fall or experienced change in No Accompanied By: self activities of daily living that may affect Transfer Assistance: None risk of falls: Patient Identification Verified: Yes Signs or symptoms of abuse/neglect since last No Secondary Verification Process Yes visito Completed: Hospitalized since last visit: No Patient Requires Transmission-Based No Implantable device outside of the clinic excluding No Precautions: cellular tissue based products placed in the center Patient Has Alerts: No since last visit: Pain Present Now: No Electronic Signature(s) Signed: 07/05/2019 4:21:10 PM By: [name] EMT/HBOT Entered By: [name] on 07/05/2019 13:42:18 -------------------------------------------------------------------------------- Encounter Discharge Information Details Patient Name: Date of Service: [name]. 07/05/2019 1:00 PM Medical Record [id number] Patient Account Number: [id number] Date of Birth/Sex: Treating RN: 05/03/1946 (73 y.o. [name] Primary Care [name]: SYSTEM, PCP Other Clinician: Referring [name]: Treating [name]/Extender:[name], [name] in Treatment: 3 Encounter Discharge Information Items Discharge Condition: Stable Ambulatory Status: Ambulatory Discharge Destination: Home Transportation: Private Auto Accompanied By:  self Schedule Follow-up Appointment: Yes Clinical Summary of Care: Patient Declined Electronic Signature(s) Signed: 07/05/2019 4:21:10 PM By: [name] EMT/HBOT Entered By: [name] on 07/05/2019 15:59:07 -------------------------------------------------------------------------------- Patient/Caregiver Education Details Patient Name: [name] 11/18/2020andnbsp1:00 Date of Service: PM Medical Record [id number] Number: Patient Account Number: [id number] Treating RN: 21-Sep-1945 (73 y.o. [name] Date of Birth/Gender: M) Other Clinician: Primary Care Physician: SYSTEM, PCP Treating [name] Referring Physician: Physician/Extender: [name] in Treatment: 3 Education Assessment Education Provided To: Patient Education Topics Provided Hyperbaric Oxygenation: Methods: Explain/Verbal Responses: State content correctly Electronic Signature(s) Signed: 07/05/2019 4:21:10 PM By: [name] EMT/HBOT Entered By: [name] on 07/05/2019 15:58:53 -------------------------------------------------------------------------------- Vitals Details Patient Name: Date of Service: [name]. 07/05/2019 1:00 PM Medical Record [id number] Patient Account Number: [id number] Date of Birth/Sex: Treating RN: 1946-04-17 (73 y.o. [name] Primary Care Vaniah Chambers: SYSTEM, PCP Other Clinician: Referring [name]: Treating [name]/Extender:[name], [name] in Treatment: 3 Vital Signs Time Taken: 13:15 Temperature (F): 97.6 Height (in): 70 Pulse (bpm): 51 Weight (lbs): 152 Respiratory Rate (breaths/min): 15 Body Mass Index (BMI): 21.8 Blood Pressure (mmHg): 142/70 Reference Range: 80 - 120 mg / dl Electronic Signature(s) Signed: 07/05/2019 4:21:10 PM By: [name] EMT/HBOT Entered By: [name] on 07/05/2019 13:42:40

## 2019-07-06 ENCOUNTER — Encounter (HOSPITAL_BASED_OUTPATIENT_CLINIC_OR_DEPARTMENT_OTHER): Payer: Medicare HMO | Admitting: Internal Medicine

## 2019-07-06 DIAGNOSIS — Z859 Personal history of malignant neoplasm, unspecified: Secondary | ICD-10-CM | POA: Diagnosis not present

## 2019-07-06 DIAGNOSIS — M272 Inflammatory conditions of jaws: Secondary | ICD-10-CM | POA: Diagnosis not present

## 2019-07-06 DIAGNOSIS — Z923 Personal history of irradiation: Secondary | ICD-10-CM | POA: Diagnosis not present

## 2019-07-06 NOTE — Progress Notes (Addendum)
[name], [name] ([id number]) Visit Report for 07/06/2019 HBO Details Patient Name: Date of Service: [name], [name] 07/06/2019 1:00 PM Medical Record [id number] Patient Account Number: [id number] Date of Birth/Sex: Treating RN: 1945-10-17 (73 y.o. 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 HBO Treatment Details Treatment Number: 8 Patient Type: Outpatient Chamber Type: Monoplace Chamber Serial #: [id number] Treatment Protocol: 2.5 ATA with 90 minutes oxygen, with two 5 minute air breaks Treatment Details Compression Rate Down: 2.0 psi / minute De-Compression Rate Up: 2.0 psi / minute Air breaks and CompressTx Pressure breathing periods DecompressDecompress Begins Reached (leave unused spaces Begins Ends blank) Chamber Pressure (ATA)1 2.5 2.5 2.5 2.5 2.5 --2.5 1 Clock Time (24 hr) 13:01 13:13 13:4313:4814:1814:23--14:53 15:05 Treatment Length: 124 (minutes) Treatment Segments: 4 Vital Signs Capillary Blood Glucose Reference Range: 80 - 120 mg / dl HBO Diabetic Blood Glucose Intervention Range: <131 mg/dl or >102 mg/dl Time Vitals Blood Respiratory Capillary Blood Glucose Pulse Action Type: Pulse: Temperature: Taken: Pressure: Rate: Glucose (mg/dl): Meter #: Oximetry (%) Taken: Pre 12:55 138/86 58 14 97.6 Post 15:07 117/93 50 12 98.2 Treatment Response Treatment Toleration: Well Treatment Completion Treatment Completed without Adverse Event Status: [name] Notes No concerns with treatment given Physician HBO Attestation: I certify that I supervised this HBO treatment in accordance with Medicare guidelines. A trained Yes Yes emergency  response team is readily available per hospital policies and procedures. Continue HBOT as ordered. Yes Electronic Signature(s) Signed: 07/06/2019 6:25:25 PM By: Baltazar Najjar MD Entered By: Baltazar Najjar on 07/06/2019 18:22:27 -------------------------------------------------------------------------------- HBO Safety Checklist Details Patient Name: Date of Service: [name]. 07/06/2019 1:00 PM Medical Record [id number] Patient Account Number: [id number] Date of Birth/Sex: Treating RN: 1946/03/19 (73 y.o. Tammy Sours Primary Care Brisha Mccabe: SYSTEM, PCP Other Clinician: Referring Sharisse Rantz: Treating Shelby Peltz/Extender:Robson, Lamar Sprinkles in Treatment: 3 HBO Safety Checklist Items Safety Checklist Consent Form Signed Patient voided / foley secured and emptied When did you last eato n/a Last dose of injectable or oral agent n/a NA Ostomy pouch emptied and vented if applicable NA All implantable devices assessed, documented and approved NA Intravenous access site secured and place Valuables secured Linens and cotton and cotton/polyester blend (less than 51% polyester) Personal oil-based products / skin lotions / body lotions removed NA Wigs or hairpieces removed NA Smoking or tobacco materials removed Books / newspapers / magazines / loose paper removed Cologne, aftershave, perfume and deodorant removed Jewelry removed (may wrap wedding band) NA Make-up removed Hair care products removed NA Battery operated devices (external) removed NA Heating patches and chemical warmers removed NA Titanium eyewear removed NA Nail polish cured greater than 10 hours NA Casting material cured greater than 10 hours NA Hearing aids removed NA Loose dentures or partials removed NA Prosthetics have been removed Patient demonstrates correct use of air break device (if applicable) Patient concerns have been addressed Patient grounding bracelet on and cord attached to  chamber Specifics for Inpatients (complete in addition to above) Medication sheet sent with patient Intravenous medications needed or due during therapy sent with patient Drainage tubes (e.g. nasogastric tube or chest tube secured and vented) Endotracheal or Tracheotomy tube secured Cuff deflated of air and inflated with saline Airway suctioned Electronic Signature(s) Signed: 07/06/2019 1:58:54 PM By: Benjaman Kindler EMT/HBOT  Entered ByBenjaman Kindler on 07/06/2019 13:58:53

## 2019-07-07 ENCOUNTER — Encounter (HOSPITAL_BASED_OUTPATIENT_CLINIC_OR_DEPARTMENT_OTHER): Payer: Medicare HMO | Admitting: Internal Medicine

## 2019-07-07 ENCOUNTER — Other Ambulatory Visit: Payer: Self-pay

## 2019-07-07 DIAGNOSIS — Z859 Personal history of malignant neoplasm, unspecified: Secondary | ICD-10-CM | POA: Diagnosis not present

## 2019-07-07 DIAGNOSIS — Z923 Personal history of irradiation: Secondary | ICD-10-CM | POA: Diagnosis not present

## 2019-07-07 DIAGNOSIS — M272 Inflammatory conditions of jaws: Secondary | ICD-10-CM | POA: Diagnosis not present

## 2019-07-07 NOTE — Progress Notes (Addendum)
[name], [name] ([id number]) Visit Report for 07/07/2019 HBO Details Patient Name: Date of Service: [name], [name] 07/07/2019 1:00 PM Medical Record [id number] Patient Account Number: [id number] Date of Birth/Sex: Treating RN: 1946/05/06 (73 y.o. 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 HBO Treatment Details Treatment Number: 9 Patient Type: Outpatient Chamber Type: Monoplace Chamber Serial #: [id number] Treatment Protocol: 2.5 ATA with 90 minutes oxygen, with two 5 minute air breaks Treatment Details Compression Rate Down: 2.0 psi / minute De-Compression Rate Up: 2.0 psi / minute Air breaks and CompressTx Pressure breathing periods DecompressDecompress Begins Reached (leave unused spaces Begins Ends blank) Chamber Pressure (ATA)1 2.5 2.5 2.5 2.5 2.5 --2.5 1 Clock Time (24 hr) 13:07 13:19 13:4913:5414:2414:29--14:59 15:11 Treatment Length: 124 (minutes) Treatment Segments: 4 Vital Signs Capillary Blood Glucose Reference Range: 80 - 120 mg / dl HBO Diabetic Blood Glucose Intervention Range: <131 mg/dl or >403 mg/dl Time Vitals Blood Respiratory Capillary Blood Glucose Pulse Action Type: Pulse: Temperature: Taken: Pressure: Rate: Glucose (mg/dl): Meter #: Oximetry (%) Taken: Pre 13:00 147/69 56 16 97.9 Post 15:15 107/80 50 14 97.5 Treatment Response Treatment Toleration: Well Treatment Completion Treatment Completed without Adverse Event Status: Bellanie Matthew Notes No concerns with treatment given Physician HBO Attestation: I certify that I supervised this HBO treatment in accordance with Medicare guidelines. A trained Yes Yes emergency response team is  readily available per hospital policies and procedures. Continue HBOT as ordered. Yes Electronic Signature(s) Signed: 07/07/2019 6:05:38 PM By: Baltazar Najjar MD Previous Signature: 07/07/2019 4:45:58 PM Version By: Benjaman Kindler EMT/HBOT Entered By: Baltazar Najjar on 07/07/2019 18:02:34 -------------------------------------------------------------------------------- HBO Safety Checklist Details Patient Name: Date of Service: [name] [place]. 07/07/2019 1:00 PM Medical Record [id number] Patient Account Number: [id number] Date of Birth/Sex: Treating RN: 10-05-1945 (73 y.o. M) Primary Care Isolde Skaff: SYSTEM, PCP Other Clinician: Referring Kittie Krizan: Treating Jeniece Hannis/Extender:Robson, Lamar Sprinkles in Treatment: 3 HBO Safety Checklist Items Safety Checklist Consent Form Signed Patient voided / foley secured and emptied When did you last eato n/a Last dose of injectable or oral agent n/a NA Ostomy pouch emptied and vented if applicable NA All implantable devices assessed, documented and approved NA Intravenous access site secured and place Valuables secured Linens and cotton and cotton/polyester blend (less than 51% polyester) Personal oil-based products / skin lotions / body lotions removed NA Wigs or hairpieces removed NA Smoking or tobacco materials removed Books / newspapers / magazines / loose paper removed Cologne, aftershave, perfume and deodorant removed Jewelry removed (may wrap wedding band) NA Make-up removed Hair care products removed NA Battery operated devices (external) removed NA Heating patches and chemical warmers removed NA Titanium eyewear removed NA Nail polish cured greater than 10 hours NA Casting material cured greater than 10 hours NA Hearing aids removed NA Loose dentures or partials removed NA Prosthetics have been removed Patient demonstrates correct use of air break device (if applicable) Patient concerns have been addressed Patient  grounding bracelet on and cord attached to chamber Specifics for Inpatients (complete in addition to above) Medication sheet sent with patient Intravenous medications needed or due during therapy sent with patient Drainage tubes (e.g. nasogastric tube or chest tube secured and vented) Endotracheal or Tracheotomy tube secured Cuff deflated of air and inflated with saline Airway suctioned Electronic Signature(s) Signed: 07/07/2019  1:46:43 PM By: Benjaman Kindler EMT/HBOT Entered By: Benjaman Kindler on 07/07/2019 13:46:43

## 2019-07-07 NOTE — Progress Notes (Signed)
[name], AMBURN ([id number]) Visit Report for 07/07/2019 Arrival Information Details Patient Name: Date of Service: [name] 07/07/2019 1:00 PM Medical Record [id number] Patient Account Number: [id number] Date of Birth/Sex: Treating RN: 11-28-1945 (73 y.o. M) Primary Care Annaly Skop: SYSTEM, PCP Other Clinician: Referring Abdulkareem Badolato: Treating Ken Bonn/Extender:Robson, [name] in Treatment: 3 Visit Information History Since Last Visit Added or deleted any medications: No Patient Arrived: Ambulatory Any new allergies or adverse reactions: No Arrival Time: 12:55 Had a fall or experienced change in No Accompanied By: self activities of daily living that may affect Transfer Assistance: None risk of falls: Patient Identification Verified: Yes Signs or symptoms of abuse/neglect since last No Secondary Verification Process Yes visito Completed: Hospitalized since last visit: No Patient Requires Transmission-Based No Implantable device outside of the clinic excluding No Precautions: cellular tissue based products placed in the center Patient Has Alerts: No since last visit: Pain Present Now: No Electronic Signature(s) Signed: 07/07/2019 4:45:58 PM By: [name] EMT/HBOT Entered By: [name] on 07/07/2019 13:45:48 -------------------------------------------------------------------------------- Encounter Discharge Information Details Patient Name: Date of Service: [name]. 07/07/2019 1:00 PM Medical Record [id number] Patient Account Number: [id number] Date of Birth/Sex: Treating RN: 1946/01/15 (73 y.o. M) Primary Care Shyvonne Chastang: SYSTEM, PCP Other Clinician: Referring Marcha Licklider: Treating Mineola Duan/Extender:Robson, [name] in Treatment: 3 Encounter Discharge Information Items Discharge Condition: Stable Ambulatory Status: Ambulatory Discharge Destination: Home Transportation: Private Auto Accompanied By: self Schedule Follow-up  Appointment: Yes Clinical Summary of Care: Patient Declined Electronic Signature(s) Signed: 07/07/2019 4:45:58 PM By: [name] EMT/HBOT Entered By: [name] on 07/07/2019 16:10:18 -------------------------------------------------------------------------------- Patient/Caregiver Education Details Patient Name: [name] 11/20/2020andnbsp1:00 Date of Service: PM Medical Record [id number] Number: Patient Account Number: [id number] Treating RN: 12/17/1945 (73 y.o. Date of Birth/Gender: M) Other Clinician: Primary Care Physician: SYSTEM, PCP Treating [name] Referring Physician: Physician/Extender: [name] in Treatment: 3 Education Assessment Education Provided To: Patient Education Topics Provided Hyperbaric Oxygenation: Methods: Explain/Verbal Responses: State content correctly Electronic Signature(s) Signed: 07/07/2019 4:45:58 PM By: [name] EMT/HBOT Entered By: [name] on 07/07/2019 16:10:01 -------------------------------------------------------------------------------- Vitals Details Patient Name: Date of Service: [name]. 07/07/2019 1:00 PM Medical Record [id number] Patient Account Number: [id number] Date of Birth/Sex: Treating RN: 1945/09/06 (73 y.o. M) Primary Care Johnnie Goynes: SYSTEM, PCP Other Clinician: Referring Corene Resnick: Treating Khambrel Amsden/Extender:Robson, [name] in Treatment: 3 Vital Signs Time Taken: 13:00 Temperature (F): 97.9 Height (in): 70 Pulse (bpm): 56 Weight (lbs): 152 Respiratory Rate (breaths/min): 16 Body Mass Index (BMI): 21.8 Blood Pressure (mmHg): 147/69 Reference Range: 80 - 120 mg / dl Electronic Signature(s) Signed: 07/07/2019 4:45:58 PM By: [name] EMT/HBOT Entered By: [name] on 07/07/2019 13:46:06

## 2019-07-07 NOTE — Progress Notes (Signed)
KHALEEQ, LUBIN ([id number]) Visit Report for 07/07/2019 SuperBill Details Patient Name: Date of Service: RED, HOLIDAY 07/07/2019 Medical Record [id number] Patient Account Number: [id number] Date of Birth/Sex: Treating RN: Apr 19, 1946 (73 y.o. 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

## 2019-07-07 NOTE — Progress Notes (Signed)
[name], [name] ([id number]) Visit Report for 07/06/2019 Arrival Information Details Patient Name: Date of Service: [name], [name] 07/06/2019 1:00 PM Medical Record [id number] Patient Account Number: [id number] Date of Birth/Sex: Treating RN: March 15, 1946 (73 y.o. Hessie Diener Primary Care Michiel Sivley: SYSTEM, PCP Other Clinician: Referring Wynne Jury: Treating Stasia Somero/Extender:Robson, [name] in Treatment: 3 Visit Information History Since Last Visit Added or deleted any medications: No Patient Arrived: Ambulatory Any new allergies or adverse reactions: No Arrival Time: 12:50 Had a fall or experienced change in No Accompanied By: self activities of daily living that may affect Transfer Assistance: None risk of falls: Patient Identification Verified: Yes Signs or symptoms of abuse/neglect since last No Secondary Verification Process Yes visito Completed: Hospitalized since last visit: No Patient Requires Transmission-Based No Implantable device outside of the clinic excluding No Precautions: cellular tissue based products placed in the center Patient Has Alerts: No since last visit: Pain Present Now: No Electronic Signature(s) Signed: 07/07/2019 4:45:58 PM By: [name] EMT/HBOT Entered By: [name] on 07/06/2019 13:58:04 -------------------------------------------------------------------------------- Encounter Discharge Information Details Patient Name: Date of Service: [name]. 07/06/2019 1:00 PM Medical Record [id number] Patient Account Number: [id number] Date of Birth/Sex: Treating RN: [birth year] (73 y.o. Hessie Diener Primary Care Reinette Cuneo: SYSTEM, PCP Other Clinician: Referring Mikie Misner: Treating Evva Din/Extender:Robson, [name] in Treatment: 3 Encounter Discharge Information Items Discharge Condition: Stable Ambulatory Status: Ambulatory Discharge Destination: Home Transportation: Private Auto Accompanied By:  self Schedule Follow-up Appointment: Yes Clinical Summary of Care: Patient Declined Electronic Signature(s) Signed: 07/07/2019 4:45:58 PM By: [name] EMT/HBOT Entered By: [name] on 07/06/2019 15:47:18 -------------------------------------------------------------------------------- Patient/Caregiver Education Details Patient Name: [name] 11/19/2020andnbsp1:00 Date of Service: PM Medical Record [id number] Number: Patient Account Number: [id number] Treating RN: [birth year] (73 y.o. Deon Pilling Date of Birth/Gender: M) Other Clinician: Primary Care Physician: SYSTEM, PCP Treating [name] Referring Physician: Physician/Extender: [name] in Treatment: 3 Education Assessment Education Provided To: Patient Education Topics Provided Hyperbaric Oxygenation: Methods: Explain/Verbal Responses: State content correctly Electronic Signature(s) Signed: 07/07/2019 4:45:58 PM By: [name] EMT/HBOT Entered By: [name] on 07/06/2019 15:47:07 -------------------------------------------------------------------------------- Vitals Details Patient Name: Date of Service: [name]. 07/06/2019 1:00 PM Medical Record [id number] Patient Account Number: [id number] Date of Birth/Sex: Treating RN: [birth year] (73 y.o. Hessie Diener Primary Care Miriam Kestler: SYSTEM, PCP Other Clinician: Referring Roddie Riegler: Treating Mayrin Schmuck/Extender:Robson, [name] in Treatment: 3 Vital Signs Time Taken: 12:55 Temperature (F): 97.6 Height (in): 70 Pulse (bpm): 58 Weight (lbs): 152 Respiratory Rate (breaths/min): 14 Body Mass Index (BMI): 21.8 Blood Pressure (mmHg): 138/86 Reference Range: 80 - 120 mg / dl Electronic Signature(s) Signed: 07/07/2019 4:45:58 PM By: [name] EMT/HBOT Entered By: [name] on 07/06/2019 13:58:20

## 2019-07-07 NOTE — Progress Notes (Signed)
JEREMYAH, GUGLIELMO ([id number]) Visit Report for 07/06/2019 SuperBill Details Patient Name: Date of Service: [name], [name] 07/06/2019 Medical Record [id number] Patient Account Number: [id number] Date of Birth/Sex: Treating RN: November 20, 1945 (73 y.o. 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

## 2019-07-10 ENCOUNTER — Other Ambulatory Visit: Payer: Self-pay

## 2019-07-10 ENCOUNTER — Encounter (HOSPITAL_BASED_OUTPATIENT_CLINIC_OR_DEPARTMENT_OTHER): Payer: Medicare HMO | Admitting: Internal Medicine

## 2019-07-10 ENCOUNTER — Encounter (HOSPITAL_BASED_OUTPATIENT_CLINIC_OR_DEPARTMENT_OTHER): Payer: Medicare HMO | Admitting: Physician Assistant

## 2019-07-10 DIAGNOSIS — Z859 Personal history of malignant neoplasm, unspecified: Secondary | ICD-10-CM | POA: Diagnosis not present

## 2019-07-10 DIAGNOSIS — M272 Inflammatory conditions of jaws: Secondary | ICD-10-CM | POA: Diagnosis not present

## 2019-07-10 DIAGNOSIS — Z923 Personal history of irradiation: Secondary | ICD-10-CM | POA: Diagnosis not present

## 2019-07-10 NOTE — Progress Notes (Signed)
GAYLAN, HEGE ([id number]) Visit Report for 07/10/2019 Arrival Information Details Patient Name: Date of Service: KDEN, SEEBER 07/10/2019 9:00 AM Medical Record [id number] Patient Account Number: [id number] Date of Birth/Sex: Treating RN: 1946/01/16 (73 y.o. Hessie Diener Primary Care Golda Zavalza: SYSTEM, PCP Other Clinician: Referring Princella Jaskiewicz: Treating Lilibeth Opie/Extender:[name], [name] in Treatment: 4 Visit Information History Since Last Visit Added or deleted any medications: No Patient Arrived: Ambulatory Any new allergies or adverse reactions: No Arrival Time: 09:15 Had a fall or experienced change in No Accompanied By: self activities of daily living that may affect Transfer Assistance: None risk of falls: Patient Identification Verified: Yes Signs or symptoms of abuse/neglect since last No Secondary Verification Process Yes visito Completed: Hospitalized since last visit: No Patient Requires Transmission-Based No Implantable device outside of the clinic excluding No Precautions: cellular tissue based products placed in the center Patient Has Alerts: No since last visit: Pain Present Now: No Electronic Signature(s) Signed: 07/10/2019 2:18:02 PM By: Deon Pilling Entered By: Deon Pilling on 07/10/2019 09:54:50 -------------------------------------------------------------------------------- Encounter Discharge Information Details Patient Name: Date of Service: [name] 07/10/2019 9:00 AM Medical Record [id number] Patient Account Number: [id number] Date of Birth/Sex: Treating RN: 1946/01/09 (73 y.o. Hessie Diener Primary Care Dametra Whetsel: SYSTEM, PCP Other Clinician: Referring Haze Antillon: Treating Tasheika Kitzmiller/Extender:[name], [name] in Treatment: 4 Encounter Discharge Information Items Discharge Condition: Stable Ambulatory Status: Ambulatory Discharge Destination: Home Transportation: Private Auto Accompanied By: self Schedule  Follow-up Appointment: Yes Clinical Summary of Care: Electronic Signature(s) Signed: 07/10/2019 2:18:02 PM By: Deon Pilling Entered By: Deon Pilling on 07/10/2019 11:41:33 -------------------------------------------------------------------------------- Patient/Caregiver Education Details Patient Name: [name] 11/23/2020andnbsp9:00 Date of Service: AM Medical Record [id number] Number: Patient Account Number: [id number] Treating RN: 1945-10-27 (73 y.o. Deon Pilling Date of Birth/Gender: M) Other Clinician: Primary Care Physician: SYSTEM, PCP Treating [name] Referring Physician: Physician/Extender: [name] in Treatment: 4 Education Assessment Education Provided To: Patient Education Topics Provided Hyperbaric Oxygenation: Handouts: Hyperbaric Oxygen Methods: Explain/Verbal Responses: Reinforcements needed Wound/Skin Impairment: Electronic Signature(s) Signed: 07/10/2019 2:18:02 PM By: Deon Pilling Entered By: Deon Pilling on 07/10/2019 11:41:18 -------------------------------------------------------------------------------- Vitals Details Patient Name: Date of Service: [name] 07/10/2019 9:00 AM Medical Record [id number] Patient Account Number: [id number] Date of Birth/Sex: Treating RN: 1945-11-26 (73 y.o. Hessie Diener Primary Care Cayde Held: SYSTEM, PCP Other Clinician: Referring Garrit Marrow: Treating Ayanna Gheen/Extender:[name], [name] in Treatment: 4 Vital Signs Time Taken: 09:15 Temperature (F): 97.8 Height (in): 70 Pulse (bpm): 52 Weight (lbs): 152 Respiratory Rate (breaths/min): 16 Body Mass Index (BMI): 21.8 Blood Pressure (mmHg): 124/62 Reference Range: 80 - 120 mg / dl Electronic Signature(s) Signed: 07/10/2019 2:18:02 PM By: Deon Pilling Entered By: Deon Pilling on 07/10/2019 09:55:16

## 2019-07-11 ENCOUNTER — Encounter (HOSPITAL_BASED_OUTPATIENT_CLINIC_OR_DEPARTMENT_OTHER): Payer: Medicare HMO | Admitting: Physician Assistant

## 2019-07-11 ENCOUNTER — Other Ambulatory Visit: Payer: Self-pay

## 2019-07-11 ENCOUNTER — Encounter (HOSPITAL_BASED_OUTPATIENT_CLINIC_OR_DEPARTMENT_OTHER): Payer: Medicare HMO | Admitting: Internal Medicine

## 2019-07-11 DIAGNOSIS — M272 Inflammatory conditions of jaws: Secondary | ICD-10-CM | POA: Diagnosis not present

## 2019-07-11 DIAGNOSIS — Z923 Personal history of irradiation: Secondary | ICD-10-CM | POA: Diagnosis not present

## 2019-07-11 DIAGNOSIS — Z859 Personal history of malignant neoplasm, unspecified: Secondary | ICD-10-CM | POA: Diagnosis not present

## 2019-07-11 NOTE — Progress Notes (Signed)
SCOTT, CHESSMAN ([id number]) Visit Report for 07/11/2019 Problem List Details Patient Name: Date of Service: DORN, KARP 07/11/2019 9:00 AM Medical Record [id number] Patient Account Number: [id number] Date of Birth/Sex: Treating RN: 1945-10-06 (73 y.o. 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

## 2019-07-12 ENCOUNTER — Encounter (HOSPITAL_BASED_OUTPATIENT_CLINIC_OR_DEPARTMENT_OTHER): Payer: Medicare HMO | Admitting: Physician Assistant

## 2019-07-12 ENCOUNTER — Other Ambulatory Visit: Payer: Self-pay

## 2019-07-12 DIAGNOSIS — Z923 Personal history of irradiation: Secondary | ICD-10-CM | POA: Diagnosis not present

## 2019-07-12 DIAGNOSIS — Z859 Personal history of malignant neoplasm, unspecified: Secondary | ICD-10-CM | POA: Diagnosis not present

## 2019-07-12 DIAGNOSIS — M272 Inflammatory conditions of jaws: Secondary | ICD-10-CM | POA: Diagnosis not present

## 2019-07-12 DIAGNOSIS — L598 Other specified disorders of the skin and subcutaneous tissue related to radiation: Secondary | ICD-10-CM | POA: Diagnosis not present

## 2019-07-12 NOTE — Progress Notes (Addendum)
NATHANUEL, JUDE ([id number]) Visit Report for 07/12/2019 HBO Details Patient Name: Date of Service: [name], [name] 07/12/2019 9:00 AM Medical Record [id number] Patient Account Number: [id number] Date of Birth/Sex: Treating RN: Jul 15, 1946 (73 y.o. 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 HBO Treatment Details Treatment Number: 12 Patient Type: Outpatient Chamber Type: Monoplace Chamber Serial #: [id number] Treatment Protocol: 2.5 ATA with 90 minutes oxygen, with two 5 minute air breaks Treatment Details Compression Rate Down: 2.0 psi / minute De-Compression Rate Up: 2.0 psi / minute Air breaks and CompressTx Pressure breathing periods DecompressDecompress Begins Reached (leave unused spaces Begins Ends blank) Chamber Pressure (ATA)1 2.5 2.5 2.5 2.5 2.5 --2.5 1 Clock Time (24 hr) 09:33 09:45 10:1510:2010:5010:55--11:25 11:37 Treatment Length: 124 (minutes) Treatment Segments: 4 Vital Signs Capillary Blood Glucose Reference Range: 80 - 120 mg / dl HBO Diabetic Blood Glucose Intervention Range: <131 mg/dl or >249 mg/dl Time Vitals Blood Respiratory Capillary Blood Glucose Pulse Action Type: Pulse: Temperature: Taken: Pressure: Rate: Glucose (mg/dl): Meter #: Oximetry (%) Taken: Pre 09:25 125/88 56 14 97.8 Post 11:40 128/59 61 12 98.2 Treatment Response Treatment Toleration: Well Treatment Completion Treatment Completed without Adverse Event Status: Electronic Signature(s) Signed: 07/12/2019 2:51:44 PM By: [name] EMT/HBOT Signed: 07/12/2019 4:52:33 PM By: [name] PA-C Entered By: [name] on 07/12/2019  14:20:30 -------------------------------------------------------------------------------- HBO Safety Checklist Details Patient Name: Date of Service: [name] 07/12/2019 9:00 AM Medical Record [id number] Patient Account Number: [id number] Date of Birth/Sex: Treating RN: 17-May-1946 (73 y.o. M) Primary Care Styles Fambro: SYSTEM, PCP Other Clinician: Referring Lashawnna Lambrecht: Treating Farha Dano/Extender:[name], [name] in Treatment: 4 HBO Safety Checklist Items Safety Checklist Consent Form Signed Patient voided / foley secured and emptied When did you last eato n/a Last dose of injectable or oral agent n/a NA Ostomy pouch emptied and vented if applicable NA All implantable devices assessed, documented and approved NA Intravenous access site secured and place Valuables secured Linens and cotton and cotton/polyester blend (less than 51% polyester) Personal oil-based products / skin lotions / body lotions removed NA Wigs or hairpieces removed NA Smoking or tobacco materials removed Books / newspapers / magazines / loose paper removed Cologne, aftershave, perfume and deodorant removed Jewelry removed (may wrap wedding band) NA Make-up removed Hair care products removed NA Battery operated devices (external) removed NA Heating patches and chemical warmers removed NA Titanium eyewear removed NA Nail polish cured greater than 10 hours NA Casting material cured greater than 10 hours NA Hearing aids removed NA Loose dentures or partials removed NA Prosthetics have been removed Patient demonstrates correct use of air break device (if applicable) Patient concerns have been addressed Patient grounding bracelet on and cord attached to chamber Specifics for Inpatients (complete in addition to above) Medication sheet sent with patient Intravenous medications needed or due during therapy sent with patient Drainage tubes (e.g. nasogastric tube or chest tube secured  and vented) Endotracheal or Tracheotomy tube secured Cuff deflated of air and inflated with saline Airway suctioned Electronic Signature(s) Signed: 07/12/2019 9:57:54 AM By: [name] EMT/HBOT Entered By: [name] on 07/12/2019 09:57:54

## 2019-07-12 NOTE — Progress Notes (Signed)
ASHAI, BUNCE ([id number]) Visit Report for 07/12/2019 Arrival Information Details Patient Name: Date of Service: [name], [name] 07/12/2019 9:00 AM Medical Record [id number] Patient Account Number: [id number] Date of Birth/Sex: Treating RN: 01-13-1946 (73 y.o. M) Primary Care Jolicia Delira: SYSTEM, PCP Other Clinician: Referring Danean Marner: Treating Geronimo Diliberto/Extender:[name], [name] in Treatment: 4 Visit Information History Since Last Visit Added or deleted any medications: No Patient Arrived: Ambulatory Any new allergies or adverse reactions: No Arrival Time: 09:20 Had a fall or experienced change in No Accompanied By: self activities of daily living that may affect Transfer Assistance: None risk of falls: Patient Identification Verified: Yes Signs or symptoms of abuse/neglect since last No Secondary Verification Process Yes visito Completed: Hospitalized since last visit: No Patient Requires Transmission-Based No Implantable device outside of the clinic excluding No Precautions: cellular tissue based products placed in the center Patient Has Alerts: No since last visit: Pain Present Now: No Electronic Signature(s) Signed: 07/12/2019 2:51:44 PM By: [name] EMT/HBOT Entered By: [name] on 07/12/2019 09:57:08 -------------------------------------------------------------------------------- Encounter Discharge Information Details Patient Name: Date of Service: [name]. 07/12/2019 9:00 AM Medical Record [id number] Patient Account Number: [id number] Date of Birth/Sex: Treating RN: Jan 07, 1946 (73 y.o. M) Primary Care Mckenley Birenbaum: SYSTEM, PCP Other Clinician: Referring Eragon Hammond: Treating Priscilla Kirstein/Extender:[name], [name] in Treatment: 4 Encounter Discharge Information Items Discharge Condition: Stable Ambulatory Status: Ambulatory Discharge Destination: Home Transportation: Private Auto Accompanied By: self Schedule Follow-up  Appointment: Yes Clinical Summary of Care: Patient Declined Electronic Signature(s) Signed: 07/12/2019 2:51:44 PM By: [name] EMT/HBOT Entered By: [name] on 07/12/2019 14:20:55 -------------------------------------------------------------------------------- Patient/Caregiver Education Details Patient Name: [name] 11/25/2020andnbsp9:00 Date of Service: AM Medical Record [id number] Number: Patient Account Number: [id number] Treating RN: 03-03-1946 (73 y.o. Date of Birth/Gender: M) Other Clinician: Primary Care Physician: SYSTEM, PCP Treating [name] Referring Physician: Physician/Extender: [name] in Treatment: 4 Education Assessment Education Provided To: Patient Education Topics Provided Hyperbaric Oxygenation: Methods: Explain/Verbal Responses: State content correctly Electronic Signature(s) Signed: 07/12/2019 2:51:44 PM By: [name] EMT/HBOT Entered By: [name] on 07/12/2019 14:20:45 -------------------------------------------------------------------------------- Vitals Details Patient Name: Date of Service: [name]. 07/12/2019 9:00 AM Medical Record [id number] Patient Account Number: [id number] Date of Birth/Sex: Treating RN: [birth year] (73 y.o. M) Primary Care Darlyne Schmiesing: SYSTEM, PCP Other Clinician: Referring Orlanda Lemmerman: Treating George Alcantar/Extender:[name], [name] in Treatment: 4 Vital Signs Time Taken: 09:25 Temperature (F): 97.8 Height (in): 70 Pulse (bpm): 56 Weight (lbs): 152 Respiratory Rate (breaths/min): 14 Body Mass Index (BMI): 21.8 Blood Pressure (mmHg): 125/88 Reference Range: 80 - 120 mg / dl Electronic Signature(s) Signed: 07/12/2019 2:51:44 PM By: [name] EMT/HBOT Entered By: [name] on 07/12/2019 09:57:20

## 2019-07-12 NOTE — Progress Notes (Signed)
[name], [name] ([id number]) Visit Report for 07/12/2019 SuperBill Details Patient Name: Date of Service: [name], [name] 07/12/2019 Medical Record [id number] Patient Account Number: [id number] Date of Birth/Sex: Treating RN: 06/18/1946 (73 y.o. 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

## 2019-07-14 ENCOUNTER — Encounter (HOSPITAL_BASED_OUTPATIENT_CLINIC_OR_DEPARTMENT_OTHER): Payer: Medicare HMO | Admitting: Internal Medicine

## 2019-07-17 ENCOUNTER — Encounter (HOSPITAL_BASED_OUTPATIENT_CLINIC_OR_DEPARTMENT_OTHER): Payer: Medicare HMO | Admitting: Internal Medicine

## 2019-07-17 ENCOUNTER — Other Ambulatory Visit: Payer: Self-pay

## 2019-07-17 DIAGNOSIS — Z923 Personal history of irradiation: Secondary | ICD-10-CM | POA: Diagnosis not present

## 2019-07-17 DIAGNOSIS — M272 Inflammatory conditions of jaws: Secondary | ICD-10-CM | POA: Diagnosis not present

## 2019-07-17 DIAGNOSIS — Z859 Personal history of malignant neoplasm, unspecified: Secondary | ICD-10-CM | POA: Diagnosis not present

## 2019-07-17 NOTE — Progress Notes (Signed)
RAMELLO, COWICK ([id number]) Visit Report for 07/11/2019 HBO Details Patient Name: Date of Service: [name], [name] 07/11/2019 9:00 AM Medical Record [id number] Patient Account Number: [id number] Date of Birth/Sex: Treating RN: Nov 03, 1945 (73 y.o. 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 HBO Treatment Details Treatment Number: 11 Patient Type: Outpatient Chamber Type: Monoplace Chamber Serial #: [id number] Treatment Protocol: 2.5 ATA with 90 minutes oxygen, with two 5 minute air breaks Treatment Details Compression Rate Down: 2.0 psi / minute De-Compression Rate Up: 3.0 psi / minute Air breaks and CompressTx Pressure breathing periods DecompressDecompress Begins Reached (leave unused spaces Begins Ends blank) Chamber Pressure (ATA)1 2.5 2.5 2.5 2.5 2.5 --2.5 1 Clock Time (24 hr) 09:38 09:50 10:2010:2510:5511:00--11:30 11:40 Treatment Length: 122 (minutes) Treatment Segments: 4 Vital Signs Capillary Blood Glucose Reference Range: 80 - 120 mg / dl HBO Diabetic Blood Glucose Intervention Range: <131 mg/dl or >607 mg/dl Time Vitals Blood Respiratory Capillary Blood Glucose Pulse Action Type: Pulse: Temperature: Taken: Pressure: Rate: Glucose (mg/dl): Meter #: Oximetry (%) Taken: Pre 09:25 122/62 42 16 98 Post 11:42 122/54 73 16 97.8 Treatment Response Treatment Toleration: Well Treatment Completion Treatment Completed without Adverse Event Status: Physician HBO Attestation: I certify that I supervised this HBO treatment in accordance with Medicare guidelines. A trained Yes emergency response team is readily available per hospital policies  and procedures. Continue HBOT as ordered. Yes Electronic Signature(s) Signed: 07/11/2019 10:29:27 PM By: Lenda Kelp PA-C Entered By: Lenda Kelp on 07/11/2019 22:28:08 -------------------------------------------------------------------------------- HBO Safety Checklist Details Patient Name: Date of Service: [name] 07/11/2019 9:00 AM Medical Record [id number] Patient Account Number: [id number] Date of Birth/Sex: Treating RN: August 08, 1946 (73 y.o. Elizebeth Koller Primary Care Melbert Botelho: SYSTEM, PCP Other Clinician: Referring Cindie Rajagopalan: Treating Jermery Caratachea/Extender:[name], Jake Samples in Treatment: 4 HBO Safety Checklist Items Safety Checklist Consent Form Signed Patient voided / foley secured and emptied When did you last eato 0800 Last dose of injectable or oral agent NA NA Ostomy pouch emptied and vented if applicable NA All implantable devices assessed, documented and approved NA Intravenous access site secured and place Valuables secured Linens and cotton and cotton/polyester blend (less than 51% polyester) Personal oil-based products / skin lotions / body lotions removed Wigs or hairpieces removed Smoking or tobacco materials removed Books / newspapers / magazines / loose paper removed Cologne, aftershave, perfume and deodorant removed Jewelry removed (may wrap wedding band) NA Make-up removed Hair care products removed Battery operated devices (external) removed NA Heating patches and chemical warmers removed NA Titanium eyewear removed NA Nail polish cured greater than 10 hours NA Casting material cured greater than 10 hours NA Hearing aids removed NA Loose dentures or partials removed NA Prosthetics have been removed Patient demonstrates correct use of air break device (if applicable) Patient concerns have been addressed Patient grounding bracelet on and cord attached to chamber Specifics for Inpatients (complete in addition to above) NA  Medication sheet sent with patient NA Intravenous medications needed or due during therapy sent with patient NA Drainage tubes (e.g. nasogastric tube or chest tube secured and NA vented) NA Endotracheal or Tracheotomy tube secured NA Cuff deflated of air and inflated with saline NA Airway suctioned Electronic Signature(s) Signed: 07/17/2019 6:27:46 PM By: Zandra Abts RN,  BSN Entered By: Zandra Abts on 07/11/2019 10:28:01

## 2019-07-17 NOTE — Progress Notes (Signed)
[name], [name] ([id number]) Visit Report for 07/17/2019 Arrival Information Details Patient Name: Date of Service: [name], [name] 07/17/2019 1:00 PM Medical Record [id number] Patient Account Number: [id number] Date of Birth/Sex: Treating RN: October 08, 1945 (73 y.o. M) Primary Care Marit Goodwill: SYSTEM, PCP Other Clinician: [name] Referring Izzy Doubek: Treating Ellionna Buckbee/Extender:Robson, [name] in Treatment: 5 Visit Information History Since Last Visit Added or deleted any medications: No Patient Arrived: Ambulatory Any new allergies or adverse reactions: No Arrival Time: 12:45 Had a fall or experienced change in No Accompanied By: self activities of daily living that may affect Transfer Assistance: None risk of falls: Patient Identification Verified: Yes Signs or symptoms of abuse/neglect since last No Secondary Verification Process Yes visito Completed: Hospitalized since last visit: No Patient Requires Transmission-Based No Implantable device outside of the clinic excluding No Precautions: cellular tissue based products placed in the center Patient Has Alerts: No since last visit: Pain Present Now: No Electronic Signature(s) Signed: 07/17/2019 4:19:23 PM By: [name] EMT/HBOT Entered By: [name] on 07/17/2019 14:22:50 -------------------------------------------------------------------------------- Encounter Discharge Information Details Patient Name: Date of Service: [name]. 07/17/2019 1:00 PM Medical Record [id number] Patient Account Number: [id number] Date of Birth/Sex: Treating RN: 03/18/1946 (73 y.o. M) Primary Care Quinta Eimer: SYSTEM, PCP Other Clinician: [name] Referring Shatonya Passon: Treating Wilberta Dorvil/Extender:Robson, [name] in Treatment: 5 Encounter Discharge Information Items Discharge Condition: Stable Ambulatory Status: Ambulatory Discharge Destination: Home Transportation: Private Auto Accompanied By:  self Schedule Follow-up Appointment: Yes Clinical Summary of Care: Patient Declined Electronic Signature(s) Signed: 07/17/2019 4:19:23 PM By: [name] EMT/HBOT Entered By: [name] on 07/17/2019 15:26:26 -------------------------------------------------------------------------------- Patient/Caregiver Education Details Patient Name: [name] 11/30/2020andnbsp1:00 Date of Service: PM Medical Record [id number] Number: Patient Account Number: [id number] Treating RN: [birth year] (73 y.o. Date of Birth/Gender: M) Other Clinician: [name] Primary Care Physician: SYSTEM, PCP Treating [name] Referring Physician: Physician/Extender: [name] in Treatment: 5 Education Assessment Education Provided To: Patient Education Topics Provided Hyperbaric Oxygenation: Methods: Explain/Verbal Responses: State content correctly Electronic Signature(s) Signed: 07/17/2019 4:19:23 PM By: [name] EMT/HBOT Entered By: [name] on 07/17/2019 15:26:12 -------------------------------------------------------------------------------- Vitals Details Patient Name: Date of Service: [name]. 07/17/2019 1:00 PM Medical Record [id number] Patient Account Number: [id number] Date of Birth/Sex: Treating RN: 13-Apr-1946 (73 y.o. M) Primary Care Aedyn Mckeon: SYSTEM, PCP Other Clinician: Referring Jasenia Weilbacher: Treating Josefa Syracuse/Extender:Robson, [name] in Treatment: 5 Vital Signs Time Taken: 12:50 Temperature (F): 97.9 Height (in): 70 Pulse (bpm): 50 Weight (lbs): 152 Respiratory Rate (breaths/min): 14 Body Mass Index (BMI): 21.8 Blood Pressure (mmHg): 141/79 Reference Range: 80 - 120 mg / dl Electronic Signature(s) Signed: 07/17/2019 4:19:23 PM By: [name] EMT/HBOT Entered By: [name] on 07/17/2019 14:23:10

## 2019-07-17 NOTE — Progress Notes (Signed)
[name] ([id number]) Visit Report for 07/11/2019 Arrival Information Details Patient Name: Date of Service: [name] 07/11/2019 9:00 AM Medical Record [id number] Patient Account Number: [id number] Date of Birth/Sex: Treating RN: 1946-03-05 (73 y.o. [name] Primary Care Wilberta Dorvil: SYSTEM, PCP Other Clinician: Referring Bastion Bolger: Treating Vishwa Dais/Extender:[name], [name] in Treatment: 4 Visit Information History Since Last Visit Added or deleted any medications: No Patient Arrived: Ambulatory Any new allergies or adverse reactions: No Arrival Time: 09:25 Had a fall or experienced change in No Accompanied By: alone activities of daily living that may affect Transfer Assistance: None risk of falls: Patient Identification Verified: Yes Signs or symptoms of abuse/neglect since last No Secondary Verification Process Yes visito Completed: Hospitalized since last visit: No Patient Requires Transmission-Based No Implantable device outside of the clinic excluding No Precautions: cellular tissue based products placed in the center Patient Has Alerts: No since last visit: Pain Present Now: No Electronic Signature(s) Signed: 07/17/2019 6:27:46 PM By: Levan Hurst RN, BSN Entered By: Levan Hurst on 07/11/2019 10:24:30 -------------------------------------------------------------------------------- Encounter Discharge Information Details Patient Name: Date of Service: [name] Dames. 07/11/2019 9:00 AM Medical Record [id number] Patient Account Number: [id number] Date of Birth/Sex: Treating RN: 30-Dec-1945 (72 y.o. [name] Primary Care Layken Doenges: SYSTEM, PCP Other Clinician: Referring Mandy Fitzwater: Treating Breshay Ilg/Extender:[name], [name] in Treatment: 4 Encounter Discharge Information Items Discharge Condition: Stable Ambulatory Status: Ambulatory Discharge Destination: Home Transportation: Private Auto Accompanied By:  alone Schedule Follow-up Appointment: Yes Clinical Summary of Care: Patient Declined Electronic Signature(s) Signed: 07/17/2019 6:27:46 PM By: Levan Hurst RN, BSN Entered By: Levan Hurst on 07/11/2019 12:03:03 -------------------------------------------------------------------------------- Vitals Details Patient Name: Date of Service: [name] Dames. 07/11/2019 9:00 AM Medical Record [id number] Patient Account Number: [id number] Date of Birth/Sex: Treating RN: [birth year] (73 y.o. [name] Primary Care Royden Bulman: SYSTEM, PCP Other Clinician: Referring Timothee Gali: Treating Louellen Haldeman/Extender:[name], [name] in Treatment: 4 Vital Signs Time Taken: 09:25 Temperature (F): 98.0 Height (in): 70 Pulse (bpm): 42 Weight (lbs): 152 Respiratory Rate (breaths/min): 16 Body Mass Index (BMI): 21.8 Blood Pressure (mmHg): 122/62 Reference Range: 80 - 120 mg / dl Electronic Signature(s) Signed: 07/17/2019 6:27:46 PM By: Levan Hurst RN, BSN Entered By: Levan Hurst on 07/11/2019 10:26:57

## 2019-07-17 NOTE — Progress Notes (Signed)
MURL, NAKAZAWA ([id number]) Visit Report for 07/17/2019 SuperBill Details Patient Name: Date of Service: [name], [name] 07/17/2019 Medical Record [id number] Patient Account Number: [id number] Date of Birth/Sex: Treating RN: [birth year] (73 y.o. 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

## 2019-07-17 NOTE — Progress Notes (Addendum)
[name], [name] ([id number]) Visit Report for 07/17/2019 HBO Details Patient Name: Date of Service: [name], [name] 07/17/2019 1:00 PM Medical Record [id number] Patient Account Number: [id number] Date of Birth/Sex: Treating RN: 1945-08-25 (73 y.o. 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 HBO Treatment Details Treatment Number: 13 Patient Type: Outpatient Chamber Type: Monoplace Chamber Serial #: [id number] Treatment Protocol: 2.5 ATA with 90 minutes oxygen, with two 5 minute air breaks Treatment Details Compression Rate Down: 2.0 psi / minute De-Compression Rate Up: 2.0 psi / minute Air breaks and CompressTx Pressure breathing periods DecompressDecompress Begins Reached (leave unused spaces Begins Ends blank) Chamber Pressure (ATA)1 2.5 2.5 2.5 2.5 2.5 --2.5 1 Clock Time (24 hr) 13:03 13:15 13:4513:5014:2014:25--14:55 15:07 Treatment Length: 124 (minutes) Treatment Segments: 4 Vital Signs Capillary Blood Glucose Reference Range: 80 - 120 mg / dl HBO Diabetic Blood Glucose Intervention Range: <131 mg/dl or >628 mg/dl Time Vitals Blood Respiratory Capillary Blood Glucose Pulse Action Type: Pulse: Temperature: Taken: Pressure: Rate: Glucose (mg/dl): Meter #: Oximetry (%) Taken: Pre 12:50 141/79 50 14 97.9 Post 15:10 139/67 51 14 98.4 Treatment Response Treatment Toleration: Well Treatment Completion Treatment Completed without Adverse Event Status: [name] Notes No concerns with treatment given Physician HBO Attestation: I certify that I supervised this HBO treatment in accordance with Medicare guidelines. A trained Yes Yes emergency  response team is readily available per hospital policies and procedures. Continue HBOT as ordered. Yes Electronic Signature(s) Signed: 07/17/2019 6:46:53 PM By: Baltazar Najjar MD Previous Signature: 07/17/2019 4:19:23 PM Version By: Benjaman Kindler EMT/HBOT Entered By: Baltazar Najjar on 07/17/2019 18:43:42 -------------------------------------------------------------------------------- HBO Safety Checklist Details Patient Name: Date of Service: [name]. 07/17/2019 1:00 PM Medical Record [id number] Patient Account Number: [id number] Date of Birth/Sex: Treating RN: 17-Jul-1946 (73 y.o. M) Primary Care Trayshawn Durkin: SYSTEM, PCP Other Clinician: Benjaman Kindler Referring Eliga Arvie: Treating Alejandro Gamel/Extender:Robson, Lamar Sprinkles in Treatment: 5 HBO Safety Checklist Items Safety Checklist Consent Form Signed Patient voided / foley secured and emptied When did you last eato n/a Last dose of injectable or oral agent n/a NA Ostomy pouch emptied and vented if applicable NA All implantable devices assessed, documented and approved NA Intravenous access site secured and place Valuables secured Linens and cotton and cotton/polyester blend (less than 51% polyester) Personal oil-based products / skin lotions / body lotions removed NA Wigs or hairpieces removed NA Smoking or tobacco materials removed Books / newspapers / magazines / loose paper removed Cologne, aftershave, perfume and deodorant removed Jewelry removed (may wrap wedding band) NA Make-up removed Hair care products removed NA Battery operated devices (external) removed NA Heating patches and chemical warmers removed NA Titanium eyewear removed NA Nail polish cured greater than 10 hours NA Casting material cured greater than 10 hours NA Hearing aids removed NA Loose dentures or partials removed NA Prosthetics have been removed Patient demonstrates correct use of air break device (if applicable) Patient concerns have  been addressed Patient grounding bracelet on and cord attached to chamber Specifics for Inpatients (complete in addition to above) Medication sheet sent with patient Intravenous medications needed or due during therapy sent with patient Drainage tubes (e.g. nasogastric tube or chest tube secured and vented) Endotracheal or Tracheotomy tube secured Cuff deflated of air and inflated with saline Airway suctioned  Electronic Signature(s) Signed: 07/17/2019 2:23:46 PM By: Benjaman Kindler EMT/HBOT Entered By: Benjaman Kindler on 07/17/2019 14:23:46

## 2019-07-18 ENCOUNTER — Encounter (HOSPITAL_BASED_OUTPATIENT_CLINIC_OR_DEPARTMENT_OTHER): Payer: Medicare HMO | Attending: Internal Medicine | Admitting: Internal Medicine

## 2019-07-18 DIAGNOSIS — C44329 Squamous cell carcinoma of skin of other parts of face: Secondary | ICD-10-CM | POA: Diagnosis not present

## 2019-07-18 DIAGNOSIS — C760 Malignant neoplasm of head, face and neck: Secondary | ICD-10-CM | POA: Insufficient documentation

## 2019-07-18 DIAGNOSIS — M272 Inflammatory conditions of jaws: Secondary | ICD-10-CM | POA: Diagnosis not present

## 2019-07-18 DIAGNOSIS — M8788 Other osteonecrosis, other site: Secondary | ICD-10-CM | POA: Insufficient documentation

## 2019-07-18 DIAGNOSIS — R1312 Dysphagia, oropharyngeal phase: Secondary | ICD-10-CM | POA: Diagnosis not present

## 2019-07-18 DIAGNOSIS — R627 Adult failure to thrive: Secondary | ICD-10-CM | POA: Diagnosis not present

## 2019-07-18 NOTE — Progress Notes (Addendum)
[name], [name] ([id number]) Visit Report for 07/18/2019 HBO Details Patient Name: Date of Service: [name], [name] 07/18/2019 1:00 PM Medical Record [id number] Patient Account Number: [id number] Date of Birth/Sex: Treating RN: 25-Sep-1945 (73 y.o. 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 HBO Treatment Details Treatment Number: 14 Patient Type: Outpatient Chamber Type: Monoplace Chamber Serial #: [id number] Treatment Protocol: 2.5 ATA with 90 minutes oxygen, with two 5 minute air breaks Treatment Details Compression Rate Down: 2.0 psi / minute De-Compression Rate Up: 2.0 psi / minute Air breaks and CompressTx Pressure breathing periods DecompressDecompress Begins Reached (leave unused spaces Begins Ends blank) Chamber Pressure (ATA)1 2.5 2.5 2.5 2.5 2.5 --2.5 1 Clock Time (24 hr) 13:05 13:17 13:4713:5214:2214:27--14:57 15:09 Treatment Length: 124 (minutes) Treatment Segments: 4 Vital Signs Capillary Blood Glucose Reference Range: 80 - 120 mg / dl HBO Diabetic Blood Glucose Intervention Range: <131 mg/dl or >213 mg/dl Time Vitals Blood Respiratory Capillary Blood Glucose Pulse Action Type: Pulse: Temperature: Taken: Pressure: Rate: Glucose (mg/dl): Meter #: Oximetry (%) Taken: Pre 13:00 134/56 52 13 97.9 Post 15:11 99/65 68 15 98.4 Treatment Response Treatment Toleration: Well Treatment Completion Treatment Completed without Adverse Event Status: Maloree Uplinger Notes No concerns with treatment given Physician HBO Attestation: I certify that I supervised this HBO treatment in accordance with Medicare guidelines. A trained Yes Yes emergency  response team is readily available per hospital policies and procedures. Continue HBOT as ordered. Yes Electronic Signature(s) Signed: 07/18/2019 6:45:39 PM By: Baltazar Najjar MD Previous Signature: 07/18/2019 4:40:25 PM Version By: Benjaman Kindler EMT/HBOT Entered By: Baltazar Najjar on 07/18/2019 18:42:15 -------------------------------------------------------------------------------- HBO Safety Checklist Details Patient Name: Date of Service: [name]. 07/18/2019 1:00 PM Medical Record [id number] Patient Account Number: [id number] Date of Birth/Sex: Treating RN: 05-28-1946 (73 y.o. M) Primary Care Jhoel Stieg: SYSTEM, PCP Other Clinician: Benjaman Kindler Referring Bruchy Mikel: Treating Soraiya Ahner/Extender:Robson, Lamar Sprinkles in Treatment: 5 HBO Safety Checklist Items Safety Checklist Consent Form Signed Patient voided / foley secured and emptied When did you last eato n/a Last dose of injectable or oral agent n/a NA Ostomy pouch emptied and vented if applicable NA All implantable devices assessed, documented and approved NA Intravenous access site secured and place Valuables secured Linens and cotton and cotton/polyester blend (less than 51% polyester) Personal oil-based products / skin lotions / body lotions removed NA Wigs or hairpieces removed NA Smoking or tobacco materials removed Books / newspapers / magazines / loose paper removed Cologne, aftershave, perfume and deodorant removed Jewelry removed (may wrap wedding band) NA Make-up removed Hair care products removed NA Battery operated devices (external) removed NA Heating patches and chemical warmers removed NA Titanium eyewear removed NA Nail polish cured greater than 10 hours NA Casting material cured greater than 10 hours NA Hearing aids removed NA Loose dentures or partials removed NA Prosthetics have been removed Patient demonstrates correct use of air break device (if applicable) Patient concerns have  been addressed Patient grounding bracelet on and cord attached to chamber Specifics for Inpatients (complete in addition to above) Medication sheet sent with patient Intravenous medications needed or due during therapy sent with patient Drainage tubes (e.g. nasogastric tube or chest tube secured and vented) Endotracheal or Tracheotomy tube secured Cuff deflated of air and inflated with saline Airway suctioned  Electronic Signature(s) Signed: 07/18/2019 1:23:04 PM By: Benjaman Kindler EMT/HBOT Entered By: Benjaman Kindler on 07/18/2019 13:23:04

## 2019-07-18 NOTE — Progress Notes (Signed)
BRAXLEE, MIRCHANDANI ([id number]) Visit Report for 07/18/2019 SuperBill Details Patient Name: Date of Service: [name], [name] 07/18/2019 Medical Record [id number] Patient Account Number: [id number] Date of Birth/Sex: Treating RN: 07/22/1946 (73 y.o. 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

## 2019-07-18 NOTE — Progress Notes (Signed)
[name], [name] ([id number]) Visit Report for 07/18/2019 Arrival Information Details Patient Name: Date of Service: [name], [name] 07/18/2019 1:00 PM Medical Record [id number] Patient Account Number: [id number] Date of Birth/Sex: Treating RN: 1945-09-21 (73 y.o. M) Primary Care Blandina Renaldo: SYSTEM, PCP Other Clinician: [name] Referring Aireonna Bauer: Treating Charina Fons/Extender:Robson, [name] in Treatment: 5 Visit Information History Since Last Visit Added or deleted any medications: No Patient Arrived: Ambulatory Any new allergies or adverse reactions: No Arrival Time: 12:55 Had a fall or experienced change in No Accompanied By: self activities of daily living that may affect Transfer Assistance: None risk of falls: Patient Identification Verified: Yes Signs or symptoms of abuse/neglect since last No Secondary Verification Process Yes visito Completed: Hospitalized since last visit: No Patient Requires Transmission-Based No Implantable device outside of the clinic excluding No Precautions: cellular tissue based products placed in the center Patient Has Alerts: No since last visit: Pain Present Now: No Electronic Signature(s) Signed: 07/18/2019 4:40:25 PM By: [name] EMT/HBOT Entered By: [name] on 07/18/2019 13:22:05 -------------------------------------------------------------------------------- Encounter Discharge Information Details Patient Name: Date of Service: [name] Dames. 07/18/2019 1:00 PM Medical Record [id number] Patient Account Number: [id number] Date of Birth/Sex: Treating RN: August 12, 1946 (73 y.o. M) Primary Care Shanita Kanan: SYSTEM, PCP Other Clinician: [name] Referring Hadas Jessop: Treating Maylene Crocker/Extender:Robson, [name] in Treatment: 5 Encounter Discharge Information Items Discharge Condition: Stable Ambulatory Status: Ambulatory Discharge Destination: Home Transportation: Private Auto Accompanied By:  self Schedule Follow-up Appointment: Yes Clinical Summary of Care: Patient Declined Electronic Signature(s) Signed: 07/18/2019 4:40:25 PM By: [name] EMT/HBOT Entered By: [name] on 07/18/2019 16:37:02 -------------------------------------------------------------------------------- Patient/Caregiver Education Details Patient Name: Date of Service: [name], [name] 12/1/2020andnbsp1:00 PM Medical Record [id number] Patient Account Number: [id number] Date of Birth/Gender: 02/09/1946 (73 y.o. M) Treating RN: Primary Care Physician: SYSTEM, PCP Other Clinician: [name] Referring Physician: Treating Physician/Extender:Robson, [name] in Treatment: 5 Education Assessment Education Provided To: Patient Education Topics Provided Hyperbaric Oxygenation: Methods: Explain/Verbal Responses: State content correctly Electronic Signature(s) Signed: 07/18/2019 4:40:25 PM By: [name] EMT/HBOT Entered By: [name] on 07/18/2019 16:36:50 -------------------------------------------------------------------------------- Vitals Details Patient Name: Date of Service: [name] Dames. 07/18/2019 1:00 PM Medical Record [id number] Patient Account Number: [id number] Date of Birth/Sex: Treating RN: [birth year] (73 y.o. M) Primary Care Tiaja Hagan: SYSTEM, PCP Other Clinician: [name] Referring Ireland Chagnon: Treating Dasani Thurlow/Extender:Robson, [name] in Treatment: 5 Vital Signs Time Taken: 13:00 Temperature (F): 97.9 Height (in): 70 Pulse (bpm): 52 Weight (lbs): 152 Respiratory Rate (breaths/min): 13 Body Mass Index (BMI): 21.8 Blood Pressure (mmHg): 134/56 Reference Range: 80 - 120 mg / dl Electronic Signature(s) Signed: 07/18/2019 4:40:25 PM By: [name] EMT/HBOT Entered By: [name] on 07/18/2019 13:22:23

## 2019-07-19 ENCOUNTER — Encounter (HOSPITAL_BASED_OUTPATIENT_CLINIC_OR_DEPARTMENT_OTHER): Payer: Medicare HMO | Admitting: Physician Assistant

## 2019-07-19 ENCOUNTER — Other Ambulatory Visit: Payer: Self-pay

## 2019-07-19 DIAGNOSIS — L598 Other specified disorders of the skin and subcutaneous tissue related to radiation: Secondary | ICD-10-CM | POA: Diagnosis not present

## 2019-07-19 DIAGNOSIS — M272 Inflammatory conditions of jaws: Secondary | ICD-10-CM | POA: Diagnosis not present

## 2019-07-19 DIAGNOSIS — C760 Malignant neoplasm of head, face and neck: Secondary | ICD-10-CM | POA: Diagnosis not present

## 2019-07-19 DIAGNOSIS — M8788 Other osteonecrosis, other site: Secondary | ICD-10-CM | POA: Diagnosis not present

## 2019-07-19 NOTE — Progress Notes (Signed)
SHAMAAR, KEMPSON ([id number]) Visit Report for 07/19/2019 Arrival Information Details Patient Name: Date of Service: [name], [name] 07/19/2019 1:00 PM Medical Record [id number] Patient Account Number: [id number] Date of Birth/Sex: Treating RN: 07/20/1946 (73 y.o. M) Primary Care Isador Castille: SYSTEM, PCP Other Clinician: [name] Referring Nailani Full: Treating Kenndra Morris/Extender:[name], [name] in Treatment: 5 Visit Information History Since Last Visit Added or deleted any medications: No Patient Arrived: Ambulatory Any new allergies or adverse reactions: No Arrival Time: 12:55 Had a fall or experienced change in No Accompanied By: self activities of daily living that may affect Transfer Assistance: None risk of falls: Patient Identification Verified: Yes Signs or symptoms of abuse/neglect since last No Secondary Verification Process Yes visito Completed: Hospitalized since last visit: No Patient Requires Transmission-Based No Implantable device outside of the clinic excluding No Precautions: cellular tissue based products placed in the center Patient Has Alerts: No since last visit: Pain Present Now: No Electronic Signature(s) Signed: 07/19/2019 4:25:17 PM By: [name] EMT/HBOT Entered By: [name] on 07/19/2019 14:10:12 -------------------------------------------------------------------------------- Encounter Discharge Information Details Patient Name: Date of Service: [name]. 07/19/2019 1:00 PM Medical Record [id number] Patient Account Number: [id number] Date of Birth/Sex: Treating RN: 10-Dec-1945 (73 y.o. M) Primary Care Delford Wingert: SYSTEM, PCP Other Clinician: [name] Referring Horace Wishon: Treating Millie Shorb/Extender:[name], [name] in Treatment: 5 Encounter Discharge Information Items Discharge Condition: Stable Ambulatory Status: Ambulatory Discharge Destination: Home Transportation: Private Auto Accompanied By:  self Schedule Follow-up Appointment: Yes Clinical Summary of Care: Patient Declined Electronic Signature(s) Signed: 07/19/2019 4:25:17 PM By: [name] EMT/HBOT Entered By: [name] on 07/19/2019 16:16:43 -------------------------------------------------------------------------------- Patient/Caregiver Education Details Patient Name: Date of Service: [name], [name] 12/2/2020andnbsp1:00 PM Medical Record [id number] Patient Account Number: [id number] Date of Birth/Gender: Jan 06, 1946 (73 y.o. M) Treating RN: Primary Care Physician: SYSTEM, PCP Other Clinician: [name] Referring Physician: Treating Physician/Extender:[name], [name] in Treatment: 5 Education Assessment Education Provided To: Patient Education Topics Provided Hyperbaric Oxygenation: Methods: Explain/Verbal Responses: State content correctly Electronic Signature(s) Signed: 07/19/2019 4:25:17 PM By: [name] EMT/HBOT Entered By: [name] on 07/19/2019 16:16:25 -------------------------------------------------------------------------------- Vitals Details Patient Name: Date of Service: [name]. 07/19/2019 1:00 PM Medical Record [id number] Patient Account Number: [id number] Date of Birth/Sex: Treating RN: Oct 08, 1945 (73 y.o. M) Primary Care Chaz Mcglasson: SYSTEM, PCP Other Clinician: [name] Referring Livan Hires: Treating Teonia Yager/Extender:[name], [name] in Treatment: 5 Vital Signs Time Taken: 13:00 Temperature (F): 98 Height (in): 70 Pulse (bpm): 55 Weight (lbs): 152 Respiratory Rate (breaths/min): 15 Body Mass Index (BMI): 21.8 Blood Pressure (mmHg): 132/64 Reference Range: 80 - 120 mg / dl Electronic Signature(s) Signed: 07/19/2019 4:25:17 PM By: [name] EMT/HBOT Entered By: [name] on 07/19/2019 14:10:29

## 2019-07-19 NOTE — Progress Notes (Addendum)
CLEAVON, HARIS ([id number]) Visit Report for 07/19/2019 HBO Details Patient Name: Date of Service: [name], [name] 07/19/2019 1:00 PM Medical Record [id number] Patient Account Number: [id number] Date of Birth/Sex: Treating RN: [birth year] (73 y.o. 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 HBO Treatment Details Treatment Number: 15 Patient Type: Outpatient Chamber Type: Monoplace Chamber Serial #: [id number] Treatment Protocol: 2.5 ATA with 90 minutes oxygen, with two 5 minute air breaks Treatment Details Compression Rate Down: 2.0 psi / minute De-Compression Rate Up: 2.0 psi / minute Air breaks and CompressTx Pressure breathing periods DecompressDecompress Begins Reached (leave unused spaces Begins Ends blank) Chamber Pressure (ATA)1 2.5 2.5 2.5 2.5 2.5 --2.5 1 Clock Time (24 hr) 13:04 13:16 13:4613:5114:2114:26--14:56 15:08 Treatment Length: 124 (minutes) Treatment Segments: 4 Vital Signs Capillary Blood Glucose Reference Range: 80 - 120 mg / dl HBO Diabetic Blood Glucose Intervention Range: <131 mg/dl or >010 mg/dl Time Vitals Blood Respiratory Capillary Blood Glucose Pulse Action Type: Pulse: Temperature: Taken: Pressure: Rate: Glucose (mg/dl): Meter #: Oximetry (%) Taken: Pre 13:00 132/64 55 15 98 Post 15:10 134/62 50 13 97.5 Treatment Response Treatment Toleration: Well Treatment Completion Treatment Completed without Adverse Event Status: Physician HBO Attestation: I certify that I supervised this HBO treatment in accordance with Medicare guidelines. A trained Yes emergency response team is readily available per hospital policies  and procedures. Continue HBOT as ordered. Yes Electronic Signature(s) Signed: 07/20/2019 10:35:37 AM By: Lenda Kelp PA-C Previous Signature: 07/19/2019 4:25:17 PM Version By: Benjaman Kindler EMT/HBOT Entered By: Lenda Kelp on 07/20/2019 10:35:37 -------------------------------------------------------------------------------- HBO Safety Checklist Details Patient Name: Date of Service: [name] 07/19/2019 1:00 PM Medical Record [id number] Patient Account Number: [id number] Date of Birth/Sex: Treating RN: [birth year] (73 y.o. M) Primary Care Cimone Fahey: SYSTEM, PCP Other Clinician: Benjaman Kindler Referring Jhayden Demuro: Treating Yentl Verge/Extender:[name], Jake Samples in Treatment: 5 HBO Safety Checklist Items Safety Checklist Consent Form Signed Patient voided / foley secured and emptied When did you last eato n/a Last dose of injectable or oral agent n/a NA Ostomy pouch emptied and vented if applicable NA All implantable devices assessed, documented and approved NA Intravenous access site secured and place Valuables secured Linens and cotton and cotton/polyester blend (less than 51% polyester) Personal oil-based products / skin lotions / body lotions removed NA Wigs or hairpieces removed NA Smoking or tobacco materials removed Books / newspapers / magazines / loose paper removed Cologne, aftershave, perfume and deodorant removed Jewelry removed (may wrap wedding band) NA Make-up removed Hair care products removed NA Battery operated devices (external) removed NA Heating patches and chemical warmers removed NA Titanium eyewear removed NA Nail polish cured greater than 10 hours NA Casting material cured greater than 10 hours NA Hearing aids removed NA Loose dentures or partials removed NA Prosthetics have been removed Patient demonstrates correct use of air break device (if applicable) Patient concerns have been addressed Patient grounding bracelet on and cord  attached to chamber Specifics for Inpatients (complete in addition to above) Medication sheet sent with patient Intravenous medications needed or due during therapy sent with patient Drainage tubes (e.g. nasogastric tube or chest tube secured and vented) Endotracheal or Tracheotomy tube secured Cuff deflated of air and inflated with saline Airway suctioned Electronic Signature(s) Signed: 07/19/2019  2:11:20 PM By: Benjaman Kindler EMT/HBOT Entered By: Benjaman Kindler on 07/19/2019 14:11:18

## 2019-07-20 ENCOUNTER — Encounter (HOSPITAL_BASED_OUTPATIENT_CLINIC_OR_DEPARTMENT_OTHER): Payer: Medicare HMO | Admitting: Internal Medicine

## 2019-07-20 DIAGNOSIS — C760 Malignant neoplasm of head, face and neck: Secondary | ICD-10-CM | POA: Diagnosis not present

## 2019-07-20 DIAGNOSIS — M272 Inflammatory conditions of jaws: Secondary | ICD-10-CM | POA: Diagnosis not present

## 2019-07-20 DIAGNOSIS — M8788 Other osteonecrosis, other site: Secondary | ICD-10-CM | POA: Diagnosis not present

## 2019-07-20 NOTE — Progress Notes (Signed)
[name], [name] ([id number]) Visit Report for 07/20/2019 Arrival Information Details Patient Name: Date of Service: [name], [name] 07/20/2019 1:00 PM Medical Record [id number] Patient Account Number: [id number] Date of Birth/Sex: Treating RN: Sep 02, 1945 (73 y.o. M) Primary Care Cason Luffman: SYSTEM, PCP Other Clinician: [name] Referring Selah Zelman: Treating Caitlain Tweed/Extender:Robson, [name] in Treatment: 5 Visit Information History Since Last Visit Added or deleted any medications: No Patient Arrived: Ambulatory Any new allergies or adverse reactions: No Arrival Time: 12:55 Had a fall or experienced change in No Accompanied By: self activities of daily living that may affect Transfer Assistance: None risk of falls: Patient Identification Verified: Yes Signs or symptoms of abuse/neglect since last No Secondary Verification Process Yes visito Completed: Hospitalized since last visit: No Patient Requires Transmission-Based No Implantable device outside of the clinic excluding No Precautions: cellular tissue based products placed in the center Patient Has Alerts: No since last visit: Pain Present Now: No Electronic Signature(s) Signed: 07/20/2019 3:43:57 PM By: [name] EMT/HBOT Entered By: [name] on 07/20/2019 13:28:39 -------------------------------------------------------------------------------- Encounter Discharge Information Details Patient Name: Date of Service: [name]. 07/20/2019 1:00 PM Medical Record [id number] Patient Account Number: [id number] Date of Birth/Sex: Treating RN: Apr 28, 1946 (73 y.o. M) Primary Care Sparkles Mcneely: SYSTEM, PCP Other Clinician: [name] Referring Yanett Conkright: Treating Kaprice Kage/Extender:Robson, [name] in Treatment: 5 Encounter Discharge Information Items Discharge Condition: Stable Ambulatory Status: Ambulatory Discharge Destination: Home Transportation: Private Auto Accompanied By:  self Schedule Follow-up Appointment: Yes Clinical Summary of Care: Patient Declined Electronic Signature(s) Signed: 07/20/2019 3:43:57 PM By: [name] EMT/HBOT Entered By: [name] on 07/20/2019 15:42:18 -------------------------------------------------------------------------------- Patient/Caregiver Education Details Patient Name: Date of Service: [name], [name] 12/3/2020andnbsp1:00 PM Medical Record [id number] Patient Account Number: [id number] Date of Birth/Gender: 1945/09/19 (73 y.o. M) Treating RN: Primary Care Physician: SYSTEM, PCP Other Clinician: [name] Referring Physician: Treating Physician/Extender:Robson, [name] in Treatment: 5 Education Assessment Education Provided To: Patient Education Topics Provided Hyperbaric Oxygenation: Methods: Explain/Verbal Responses: State content correctly Electronic Signature(s) Signed: 07/20/2019 3:43:57 PM By: [name] EMT/HBOT Entered By: [name] on 07/20/2019 15:42:08 -------------------------------------------------------------------------------- Vitals Details Patient Name: Date of Service: [name]. 07/20/2019 1:00 PM Medical Record [id number] Patient Account Number: [id number] Date of Birth/Sex: Treating RN: [birth year] (73 y.o. M) Primary Care Afton Lavalle: SYSTEM, PCP Other Clinician: [name] Referring Laurie Penado: Treating Aliesha Dolata/Extender:Robson, [name] in Treatment: 5 Vital Signs Time Taken: 13:00 Temperature (F): 98.2 Height (in): 70 Pulse (bpm): 55 Weight (lbs): 152 Respiratory Rate (breaths/min): 12 Body Mass Index (BMI): 21.8 Blood Pressure (mmHg): 139/63 Reference Range: 80 - 120 mg / dl Electronic Signature(s) Signed: 07/20/2019 3:43:57 PM By: [name] EMT/HBOT Entered By: [name] on 07/20/2019 13:32:17

## 2019-07-20 NOTE — Progress Notes (Addendum)
[name], [name] ([id number]) Visit Report for 07/20/2019 HBO Details Patient Name: Date of Service: [name], [name] 07/20/2019 1:00 PM Medical Record [id number] Patient Account Number: [id number] Date of Birth/Sex: Treating RN: 04/17/1946 (73 y.o. 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 HBO Treatment Details Treatment Number: 16 Patient Type: Outpatient Chamber Type: Monoplace Chamber Serial #: [id number] Treatment Protocol: 2.5 ATA with 90 minutes oxygen, with two 5 minute air breaks Treatment Details Compression Rate Down: 2.0 psi / minute De-Compression Rate Up: 2.0 psi / minute Air breaks and CompressTx Pressure breathing periods DecompressDecompress Begins Reached (leave unused spaces Begins Ends blank) Chamber Pressure (ATA)1 2.5 2.5 2.5 2.5 2.5 --2.5 1 Clock Time (24 hr) 13:02 13:14 13:4413:4914:1914:24--14:54 15:06 Treatment Length: 124 (minutes) Treatment Segments: 4 Vital Signs Capillary Blood Glucose Reference Range: 80 - 120 mg / dl HBO Diabetic Blood Glucose Intervention Range: <131 mg/dl or >875 mg/dl Time Vitals Blood Respiratory Capillary Blood Glucose Pulse Action Type: Pulse: Temperature: Taken: Pressure: Rate: Glucose (mg/dl): Meter #: Oximetry (%) Taken: Pre 13:00 139/63 55 12 98.2 Post 15:08 133/58 52 14 98.6 Treatment Response Treatment Toleration: Well Treatment Completion Treatment Completed without Adverse Event Status: Keilly Fatula Notes No concerns with treatment given Physician HBO Attestation: I certify that I supervised this HBO treatment in accordance with Medicare guidelines. A trained Yes Yes emergency  response team is readily available per hospital policies and procedures. Continue HBOT as ordered. Yes Electronic Signature(s) Signed: 07/21/2019 7:57:15 AM By: Baltazar Najjar MD Previous Signature: 07/20/2019 3:43:57 PM Version By: Benjaman Kindler EMT/HBOT Entered By: Baltazar Najjar on 07/20/2019 18:14:57 -------------------------------------------------------------------------------- HBO Safety Checklist Details Patient Name: Date of Service: [name] 07/20/2019 1:00 PM Medical Record [id number] Patient Account Number: [id number] Date of Birth/Sex: Treating RN: 1946/02/01 (73 y.o. M) Primary Care Danyele Smejkal: SYSTEM, PCP Other Clinician: Benjaman Kindler Referring Jermiah Howton: Treating Romonda Parker/Extender:Robson, Lamar Sprinkles in Treatment: 5 HBO Safety Checklist Items Safety Checklist Consent Form Signed Patient voided / foley secured and emptied When did you last eato n/a Last dose of injectable or oral agent n/a NA Ostomy pouch emptied and vented if applicable NA All implantable devices assessed, documented and approved NA Intravenous access site secured and place Valuables secured Linens and cotton and cotton/polyester blend (less than 51% polyester) Personal oil-based products / skin lotions / body lotions removed NA Wigs or hairpieces removed NA Smoking or tobacco materials removed Books / newspapers / magazines / loose paper removed Cologne, aftershave, perfume and deodorant removed Jewelry removed (may wrap wedding band) NA Make-up removed Hair care products removed NA Battery operated devices (external) removed NA Heating patches and chemical warmers removed NA Titanium eyewear removed NA Nail polish cured greater than 10 hours NA Casting material cured greater than 10 hours NA Hearing aids removed NA Loose dentures or partials removed NA Prosthetics have been removed Patient demonstrates correct use of air break device (if applicable) Patient concerns have  been addressed Patient grounding bracelet on and cord attached to chamber Specifics for Inpatients (complete in addition to above) Medication sheet sent with patient Intravenous medications needed or due during therapy sent with patient Drainage tubes (e.g. nasogastric tube or chest tube secured and vented) Endotracheal or Tracheotomy tube secured Cuff deflated of air and inflated with saline Airway suctioned  Electronic Signature(s) Signed: 07/20/2019 1:29:30 PM By: Benjaman Kindler EMT/HBOT Entered By: Benjaman Kindler on 07/20/2019 13:29:30

## 2019-07-20 NOTE — Progress Notes (Signed)
[name], [name] ([id number]) Visit Report for 07/19/2019 Problem List Details Patient Name: Date of Service: [name], [name] 07/19/2019 1:00 PM Medical Record [id number] Patient Account Number: [id number] Date of Birth/Sex: Treating RN: 1945-11-28 (73 y.o. 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

## 2019-07-21 ENCOUNTER — Encounter (HOSPITAL_BASED_OUTPATIENT_CLINIC_OR_DEPARTMENT_OTHER): Payer: Medicare HMO | Admitting: Internal Medicine

## 2019-07-21 ENCOUNTER — Other Ambulatory Visit: Payer: Self-pay

## 2019-07-21 DIAGNOSIS — M8788 Other osteonecrosis, other site: Secondary | ICD-10-CM | POA: Diagnosis not present

## 2019-07-21 DIAGNOSIS — C760 Malignant neoplasm of head, face and neck: Secondary | ICD-10-CM | POA: Diagnosis not present

## 2019-07-21 DIAGNOSIS — M272 Inflammatory conditions of jaws: Secondary | ICD-10-CM | POA: Diagnosis not present

## 2019-07-21 NOTE — Progress Notes (Signed)
ASANI, CARDE ([id number]) Visit Report for 07/21/2019 Arrival Information Details Patient Name: Date of Service: BARTLEY, AASE 07/21/2019 1:00 PM Medical Record [id number] Patient Account Number: [id number] Date of Birth/Sex: Treating RN: September 03, 1945 (73 y.o. M) Primary Care Darsha Zumstein: SYSTEM, PCP Other Clinician: [name] Referring Destinie Thornsberry: Treating Ellizabeth Dacruz/Extender:Robson, [name] in Treatment: 5 Visit Information History Since Last Visit Added or deleted any medications: No Patient Arrived: Ambulatory Any new allergies or adverse reactions: No Arrival Time: 12:55 Had a fall or experienced change in No Accompanied By: self activities of daily living that may affect Transfer Assistance: None risk of falls: Patient Identification Verified: Yes Signs or symptoms of abuse/neglect since last No Secondary Verification Process Yes visito Completed: Hospitalized since last visit: No Patient Requires Transmission-Based No Implantable device outside of the clinic excluding No Precautions: cellular tissue based products placed in the center Patient Has Alerts: No since last visit: Pain Present Now: No Electronic Signature(s) Signed: 07/21/2019 3:46:47 PM By: [name] EMT/HBOT Entered By: [name] on 07/21/2019 13:44:54 -------------------------------------------------------------------------------- Encounter Discharge Information Details Patient Name: Date of Service: [name] 07/21/2019 1:00 PM Medical Record [id number] Patient Account Number: [id number] Date of Birth/Sex: Treating RN: April 05, 1946 (73 y.o. M) Primary Care Marvin Grabill: SYSTEM, PCP Other Clinician: [name] Referring Tritia Endo: Treating Kebrina Friend/Extender:Robson, [name] in Treatment: 5 Encounter Discharge Information Items Discharge Condition: Stable Ambulatory Status: Ambulatory Discharge Destination: Home Transportation: Private Auto Accompanied By:  self Schedule Follow-up Appointment: Yes Clinical Summary of Care: Patient Declined Electronic Signature(s) Signed: 07/21/2019 3:46:47 PM By: [name] EMT/HBOT Entered By: [name] on 07/21/2019 15:43:06 -------------------------------------------------------------------------------- Patient/Caregiver Education Details Patient Name: Date of Service: Barlowe, Zong W. 12/4/2020andnbsp1:00 PM Medical Record [id number] Patient Account Number: [id number] Date of Birth/Gender: Aug 04, 1946 (73 y.o. M) Treating RN: Primary Care Physician: SYSTEM, PCP Other Clinician: [name] Referring Physician: Treating Physician/Extender:Robson, [name] in Treatment: 5 Education Assessment Education Provided To: Patient Education Topics Provided Hyperbaric Oxygenation: Methods: Explain/Verbal Responses: State content correctly Electronic Signature(s) Signed: 07/21/2019 3:46:47 PM By: [name] EMT/HBOT Entered By: [name] on 07/21/2019 15:42:55 -------------------------------------------------------------------------------- Vitals Details Patient Name: Date of Service: [name] 07/21/2019 1:00 PM Medical Record [id number] Patient Account Number: [id number] Date of Birth/Sex: Treating RN: Oct 18, 1945 (73 y.o. M) Primary Care Santino Kinsella: SYSTEM, PCP Other Clinician: [name] Referring Naileah Karg: Treating Aaisha Sliter/Extender:Robson, [name] in Treatment: 5 Vital Signs Time Taken: 13:00 Temperature (F): 98 Height (in): 70 Pulse (bpm): 69 Weight (lbs): 152 Respiratory Rate (breaths/min): 16 Body Mass Index (BMI): 21.8 Blood Pressure (mmHg): 119/83 Reference Range: 80 - 120 mg / dl Electronic Signature(s) Signed: 07/21/2019 3:46:47 PM By: [name] EMT/HBOT Entered By: [name] on 07/21/2019 13:45:07

## 2019-07-21 NOTE — Progress Notes (Addendum)
DARWIN, MANZANARES ([id number]) Visit Report for 07/21/2019 HBO Details Patient Name: Date of Service: DESMOND, MCEWAN 07/21/2019 1:00 PM Medical Record [id number] Patient Account Number: [id number] Date of Birth/Sex: Treating RN: [birth year] (73 y.o. 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 HBO Treatment Details Treatment Number: 17 Patient Type: Outpatient Chamber Type: Monoplace Chamber Serial #: [id number] Treatment Protocol: 2.5 ATA with 90 minutes oxygen, with two 5 minute air breaks Treatment Details Compression Rate Down: 2.0 psi / minute De-Compression Rate Up: 2.0 psi / minute Air breaks and CompressTx Pressure breathing periods DecompressDecompress Begins Reached (leave unused spaces Begins Ends blank) Chamber Pressure (ATA)1 2.5 2.5 2.5 2.5 2.5 --2.5 1 Clock Time (24 hr) 13:10 13:22 13:5213:5714:2714:32--15:02 15:14 Treatment Length: 124 (minutes) Treatment Segments: 4 Vital Signs Capillary Blood Glucose Reference Range: 80 - 120 mg / dl HBO Diabetic Blood Glucose Intervention Range: <131 mg/dl or >213 mg/dl Time Vitals Blood Respiratory Capillary Blood Glucose Pulse Action Type: Pulse: Temperature: Taken: Pressure: Rate: Glucose (mg/dl): Meter #: Oximetry (%) Taken: Pre 13:00 119/83 69 16 98 Post 15:17 132/63 50 15 97.6 Treatment Response Treatment Toleration: Well Treatment Completion Treatment Completed without Adverse Event Status: Mazie Fencl Notes No concerns with treatment given Physician HBO Attestation: I certify that I supervised this HBO treatment in accordance with Medicare guidelines. A trained Yes Yes emergency  response team is readily available per hospital policies and procedures. Continue HBOT as ordered. Yes Electronic Signature(s) Signed: 07/21/2019 6:02:04 PM By: Baltazar Najjar MD Previous Signature: 07/21/2019 3:46:47 PM Version By: Benjaman Kindler EMT/HBOT Entered By: Baltazar Najjar on 07/21/2019 18:00:17 -------------------------------------------------------------------------------- HBO Safety Checklist Details Patient Name: Date of Service: [name] 07/21/2019 1:00 PM Medical Record [id number] Patient Account Number: [id number] Date of Birth/Sex: Treating RN: 01-22-1946 (73 y.o. M) Primary Care Seydou Hearns: SYSTEM, PCP Other Clinician: Benjaman Kindler Referring Olive Motyka: Treating Mariann Palo/Extender:Robson, Lamar Sprinkles in Treatment: 5 HBO Safety Checklist Items Safety Checklist Consent Form Signed Patient voided / foley secured and emptied When did you last eato n/a Last dose of injectable or oral agent n/a NA Ostomy pouch emptied and vented if applicable NA All implantable devices assessed, documented and approved NA Intravenous access site secured and place Valuables secured Linens and cotton and cotton/polyester blend (less than 51% polyester) Personal oil-based products / skin lotions / body lotions removed NA Wigs or hairpieces removed NA Smoking or tobacco materials removed Books / newspapers / magazines / loose paper removed Cologne, aftershave, perfume and deodorant removed Jewelry removed (may wrap wedding band) NA Make-up removed Hair care products removed NA Battery operated devices (external) removed NA Heating patches and chemical warmers removed NA Titanium eyewear removed NA Nail polish cured greater than 10 hours NA Casting material cured greater than 10 hours NA Hearing aids removed NA Loose dentures or partials removed NA Prosthetics have been removed Patient demonstrates correct use of air break device (if applicable) Patient concerns have  been addressed Patient grounding bracelet on and cord attached to chamber Specifics for Inpatients (complete in addition to above) Medication sheet sent with patient Intravenous medications needed or due during therapy sent with patient Drainage tubes (e.g. nasogastric tube or chest tube secured and vented) Endotracheal or Tracheotomy tube secured Cuff deflated of air and inflated with saline Airway suctioned  Electronic Signature(s) Signed: 07/21/2019 1:46:30 PM By: Benjaman Kindler EMT/HBOT Entered By: Benjaman Kindler on 07/21/2019 13:46:30

## 2019-07-21 NOTE — Progress Notes (Signed)
JACQUAN, VIARS ([id number]) Visit Report for 07/20/2019 SuperBill Details Patient Name: Date of Service: HAZE, FERRUFINO 07/20/2019 Medical Record [id number] Patient Account Number: [id number] Date of Birth/Sex: Treating RN: Sep 29, 1945 (73 y.o. 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

## 2019-07-21 NOTE — Progress Notes (Signed)
TYRAY, ELLEY ([id number]) Visit Report for 07/21/2019 SuperBill Details Patient Name: Date of Service: [name], [name] 07/21/2019 Medical Record [id number] Patient Account Number: [id number] Date of Birth/Sex: Treating RN: [birth year] (73 y.o. 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

## 2019-07-24 ENCOUNTER — Other Ambulatory Visit: Payer: Self-pay

## 2019-07-24 ENCOUNTER — Encounter (HOSPITAL_BASED_OUTPATIENT_CLINIC_OR_DEPARTMENT_OTHER): Payer: Medicare HMO | Admitting: Internal Medicine

## 2019-07-24 DIAGNOSIS — C760 Malignant neoplasm of head, face and neck: Secondary | ICD-10-CM | POA: Diagnosis not present

## 2019-07-24 DIAGNOSIS — M8788 Other osteonecrosis, other site: Secondary | ICD-10-CM | POA: Diagnosis not present

## 2019-07-24 DIAGNOSIS — M272 Inflammatory conditions of jaws: Secondary | ICD-10-CM | POA: Diagnosis not present

## 2019-07-24 NOTE — Progress Notes (Addendum)
[name] ([id number]) Visit Report for 07/24/2019 HBO Details Patient Name: Date of Service: [name], [name] 07/24/2019 1:00 PM Medical Record [id number] Patient Account Number: [id number] Date of Birth/Sex: Treating RN: 30-Sep-1945 (73 y.o. 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 HBO Treatment Details Treatment Number: 18 Patient Type: Outpatient Chamber Type: Monoplace Chamber Serial #: [id number] Treatment Protocol: 2.5 ATA with 90 minutes oxygen, with two 5 minute air breaks Treatment Details Compression Rate Down: 2.0 psi / minute De-Compression Rate Up: 2.0 psi / minute Air breaks and CompressTx Pressure breathing periods DecompressDecompress Begins Reached (leave unused spaces Begins Ends blank) Chamber Pressure (ATA)1 2.5 2.5 2.5 2.5 2.5 --2.5 1 Clock Time (24 hr) 13:12 13:24 13:5413:5914:2914:34--15:04 15:16 Treatment Length: 124 (minutes) Treatment Segments: 4 Vital Signs Capillary Blood Glucose Reference Range: 80 - 120 mg / dl HBO Diabetic Blood Glucose Intervention Range: <131 mg/dl or >536 mg/dl Time Vitals Blood Respiratory Capillary Blood Glucose Pulse Action Type: Pulse: Temperature: Taken: Pressure: Rate: Glucose (mg/dl): Meter #: Oximetry (%) Taken: Pre 13:05 136/67 59 14 98 Post 15:18 128/60 51 14 97.5 Treatment Response Treatment Toleration: Well Treatment Completion Treatment Completed without Adverse Event Status: Giada Schoppe Notes No concerns with treatment given Physician HBO Attestation: I certify that I supervised this HBO treatment in accordance with Medicare guidelines. A trained Yes Yes emergency  response team is readily available per hospital policies and procedures. Continue HBOT as ordered. Yes Electronic Signature(s) Signed: 07/25/2019 10:01:17 AM By: Baltazar Najjar MD Entered By: Baltazar Najjar on 07/24/2019 17:30:55 -------------------------------------------------------------------------------- HBO Safety Checklist Details Patient Name: Date of Service: [name]. 07/24/2019 1:00 PM Medical Record [id number] Patient Account Number: [id number] Date of Birth/Sex: Treating RN: Feb 13, 1946 (73 y.o. M) Primary Care Micholas Drumwright: SYSTEM, PCP Other Clinician: Benjaman Kindler Referring Emmalou Hunger: Treating Lujuana Kapler/Extender:Robson, Lamar Sprinkles in Treatment: 6 HBO Safety Checklist Items Safety Checklist Consent Form Signed Patient voided / foley secured and emptied When did you last eato n/a Last dose of injectable or oral agent n/a NA Ostomy pouch emptied and vented if applicable NA All implantable devices assessed, documented and approved NA Intravenous access site secured and place Valuables secured Linens and cotton and cotton/polyester blend (less than 51% polyester) Personal oil-based products / skin lotions / body lotions removed NA Wigs or hairpieces removed NA Smoking or tobacco materials removed Books / newspapers / magazines / loose paper removed Cologne, aftershave, perfume and deodorant removed Jewelry removed (may wrap wedding band) NA Make-up removed Hair care products removed NA Battery operated devices (external) removed NA Heating patches and chemical warmers removed NA Titanium eyewear removed NA Nail polish cured greater than 10 hours NA Casting material cured greater than 10 hours NA Hearing aids removed NA Loose dentures or partials removed NA Prosthetics have been removed Patient demonstrates correct use of air break device (if applicable) Patient concerns have been addressed Patient grounding bracelet on and cord attached to  chamber Specifics for Inpatients (complete in addition to above) Medication sheet sent with patient Intravenous medications needed or due during therapy sent with patient Drainage tubes (e.g. nasogastric tube or chest tube secured and vented) Endotracheal or Tracheotomy tube secured Cuff deflated of air and inflated with saline Airway suctioned Electronic Signature(s) Signed: 07/24/2019 1:18:54 PM By: Benjaman Kindler EMT/HBOT  Entered ByBenjaman Kindler on 07/24/2019 13:18:54

## 2019-07-25 ENCOUNTER — Other Ambulatory Visit: Payer: Self-pay

## 2019-07-25 ENCOUNTER — Encounter (HOSPITAL_BASED_OUTPATIENT_CLINIC_OR_DEPARTMENT_OTHER): Payer: Medicare HMO | Admitting: Internal Medicine

## 2019-07-25 ENCOUNTER — Other Ambulatory Visit: Payer: Self-pay | Admitting: *Deleted

## 2019-07-25 DIAGNOSIS — M8788 Other osteonecrosis, other site: Secondary | ICD-10-CM | POA: Diagnosis not present

## 2019-07-25 DIAGNOSIS — M272 Inflammatory conditions of jaws: Secondary | ICD-10-CM | POA: Diagnosis not present

## 2019-07-25 DIAGNOSIS — L598 Other specified disorders of the skin and subcutaneous tissue related to radiation: Secondary | ICD-10-CM | POA: Diagnosis not present

## 2019-07-25 DIAGNOSIS — C760 Malignant neoplasm of head, face and neck: Secondary | ICD-10-CM | POA: Diagnosis not present

## 2019-07-25 MED ORDER — SIMVASTATIN 20 MG PO TABS: 20.0000 mg | ORAL_TABLET | Freq: Every day | ORAL | 0 refills | Status: DC

## 2019-07-25 NOTE — Progress Notes (Signed)
[name], [name] ([id number]) Visit Report for 07/25/2019 Arrival Information Details Patient Name: Date of Service: [name], [name] 07/25/2019 1:00 PM Medical Record [id number] Patient Account Number: [id number] Date of Birth/Sex: Treating RN: 1946-06-02 (73 y.o. M) Primary Care Trinity Hyland: SYSTEM, PCP Other Clinician: Mikeal [name] Referring Kaysin Brock: Treating Dayjah Selman/Extender:Robson, [name] in Treatment: 6 Visit Information History Since Last Visit Added or deleted any medications: No Patient Arrived: Ambulatory Any new allergies or adverse reactions: No Arrival Time: 12:55 Had a fall or experienced change in No Accompanied By: self activities of daily living that may affect Transfer Assistance: None risk of falls: Patient Identification Verified: Yes Signs or symptoms of abuse/neglect since last No Secondary Verification Process Yes visito Completed: Hospitalized since last visit: No Patient Requires Transmission-Based No Implantable device outside of the clinic excluding No Precautions: cellular tissue based products placed in the center Patient Has Alerts: No since last visit: Pain Present Now: No Electronic Signature(s) Signed: 07/25/2019 4:34:42 PM By: [name] EMT/HBOT Entered By: [name] on 07/25/2019 13:35:39 -------------------------------------------------------------------------------- Encounter Discharge Information Details Patient Name: Date of Service: [name] Dames. 07/25/2019 1:00 PM Medical Record [id number] Patient Account Number: [id number] Date of Birth/Sex: Treating RN: 04-19-1946 (73 y.o. M) Primary Care Tripp Goins: SYSTEM, PCP Other Clinician: Mikeal [name] Referring Ravenna Legore: Treating Caysen Whang/Extender:Robson, [name] in Treatment: 6 Encounter Discharge Information Items Discharge Condition: Stable Ambulatory Status: Ambulatory Discharge Destination: Home Transportation: Private Auto Accompanied By:  self Schedule Follow-up Appointment: Yes Clinical Summary of Care: Patient Declined Electronic Signature(s) Signed: 07/25/2019 4:34:42 PM By: [name] EMT/HBOT Entered By: [name] on 07/25/2019 15:44:19 -------------------------------------------------------------------------------- Patient/Caregiver Education Details Patient Name: Date of Service: [name], [name] 12/8/2020andnbsp1:00 PM Medical Record [id number] Patient Account Number: [id number] Date of Birth/Gender: Jan 01, 1946 (73 y.o. M) Treating RN: Primary Care Physician: SYSTEM, PCP Other Clinician: Mikeal [name] Referring Physician: Treating Physician/Extender:Robson, [name] in Treatment: 6 Education Assessment Education Provided To: Patient Education Topics Provided Hyperbaric Oxygenation: Methods: Explain/Verbal Responses: State content correctly Electronic Signature(s) Signed: 07/25/2019 4:34:42 PM By: [name] EMT/HBOT Entered By: [name] on 07/25/2019 15:44:07 -------------------------------------------------------------------------------- Vitals Details Patient Name: Date of Service: [name] Dames. 07/25/2019 1:00 PM Medical Record [id number] Patient Account Number: [id number] Date of Birth/Sex: Treating RN: 02-12-1946 (73 y.o. M) Primary Care Onix Jumper: SYSTEM, PCP Other Clinician: Mikeal [name] Referring Zachari Alberta: Treating Eliakim Tendler/Extender:Robson, [name] in Treatment: 6 Vital Signs Time Taken: 13:00 Temperature (F): 97.8 Height (in): 70 Pulse (bpm): 65 Weight (lbs): 152 Respiratory Rate (breaths/min): 15 Body Mass Index (BMI): 21.8 Blood Pressure (mmHg): 129/69 Reference Range: 80 - 120 mg / dl Electronic Signature(s) Signed: 07/25/2019 4:34:42 PM By: [name] EMT/HBOT Entered By: [name] on 07/25/2019 13:35:54

## 2019-07-25 NOTE — Telephone Encounter (Signed)
Rx has been sent to the pharmacy electronically. ° °

## 2019-07-25 NOTE — Progress Notes (Signed)
[name], [name] ([id number]) Visit Report for 07/24/2019 Arrival Information Details Patient Name: Date of Service: [name], [name] 07/24/2019 1:00 PM Medical Record [id number] Patient Account Number: [id number] Date of Birth/Sex: Treating RN: April 15, 1946 (73 y.o. M) Primary Care Mamie Hundertmark: SYSTEM, PCP Other Clinician: [name] Referring Jasilyn Holderman: Treating Jaleia Hanke/Extender:Robson, [name] in Treatment: 6 Visit Information History Since Last Visit Added or deleted any medications: No Patient Arrived: Ambulatory Any new allergies or adverse reactions: No Arrival Time: 13:00 Had a fall or experienced change in No Accompanied By: self activities of daily living that may affect Transfer Assistance: None risk of falls: Patient Identification Verified: Yes Signs or symptoms of abuse/neglect since last No Secondary Verification Process Yes visito Completed: Hospitalized since last visit: No Patient Requires Transmission-Based No Implantable device outside of the clinic excluding No Precautions: cellular tissue based products placed in the center Patient Has Alerts: No since last visit: Pain Present Now: No Electronic Signature(s) Signed: 07/25/2019 4:34:42 PM By: [name] EMT/HBOT Entered By: [name] on 07/24/2019 13:17:59 -------------------------------------------------------------------------------- Encounter Discharge Information Details Patient Name: Date of Service: [name] Dames. 07/24/2019 1:00 PM Medical Record [id number] Patient Account Number: [id number] Date of Birth/Sex: Treating RN: December 11, 1945 (73 y.o. M) Primary Care Gareld Obrecht: SYSTEM, PCP Other Clinician: [name] Referring Joann Kulpa: Treating Odus Clasby/Extender:Robson, [name] in Treatment: 6 Encounter Discharge Information Items Discharge Condition: Stable Ambulatory Status: Ambulatory Discharge Destination: Home Transportation: Private Auto Accompanied By:  self Schedule Follow-up Appointment: Yes Clinical Summary of Care: Patient Declined Electronic Signature(s) Signed: 07/25/2019 4:34:42 PM By: [name] EMT/HBOT Entered By: [name] on 07/24/2019 15:39:39 -------------------------------------------------------------------------------- Patient/Caregiver Education Details Patient Name: Date of Service: [name], [name] 12/7/2020andnbsp1:00 PM Medical Record [id number] Patient Account Number: [id number] Date of Birth/Gender: [birth year] (73 y.o. M) Treating RN: Primary Care Physician: SYSTEM, PCP Other Clinician: [name] Referring Physician: Treating Physician/Extender:Robson, [name] in Treatment: 6 Education Assessment Education Provided To: Patient Education Topics Provided Hyperbaric Oxygenation: Methods: Explain/Verbal Responses: State content correctly Electronic Signature(s) Signed: 07/25/2019 4:34:42 PM By: [name] EMT/HBOT Entered By: [name] on 07/24/2019 15:39:28 -------------------------------------------------------------------------------- Vitals Details Patient Name: Date of Service: [name] Dames. 07/24/2019 1:00 PM Medical Record [id number] Patient Account Number: [id number] Date of Birth/Sex: Treating RN: 1946-06-21 (73 y.o. M) Primary Care Shadman Tozzi: SYSTEM, PCP Other Clinician: [name] Referring Ahmarion Saraceno: Treating Bettyjean Stefanski/Extender:Robson, [name] in Treatment: 6 Vital Signs Time Taken: 13:05 Temperature (F): 98 Height (in): 70 Pulse (bpm): 59 Weight (lbs): 152 Respiratory Rate (breaths/min): 14 Body Mass Index (BMI): 21.8 Blood Pressure (mmHg): 136/67 Reference Range: 80 - 120 mg / dl Electronic Signature(s) Signed: 07/25/2019 4:34:42 PM By: [name] EMT/HBOT Entered By: [name] on 07/24/2019 13:18:16

## 2019-07-25 NOTE — Progress Notes (Signed)
BLEU, REICHEL ([id number]) Visit Report for 07/25/2019 SuperBill Details Patient Name: Date of Service: [name], [name] 07/25/2019 Medical Record [id number] Patient Account Number: [id number] Date of Birth/Sex: Treating RN: Jul 27, 1946 (73 y.o. 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

## 2019-07-25 NOTE — Progress Notes (Addendum)
INDIO, SAMP ([id number]) Visit Report for 07/25/2019 HBO Details Patient Name: Date of Service: [name], [name] 07/25/2019 1:00 PM Medical Record [id number] Patient Account Number: [id number] Date of Birth/Sex: Treating RN: 07-03-1946 (73 y.o. 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 HBO Treatment Details Treatment Number: 19 Patient Type: Outpatient Chamber Type: Monoplace Chamber Serial #: [id number] Treatment Protocol: 2.5 ATA with 90 minutes oxygen, with two 5 minute air breaks Treatment Details Compression Rate Down: 2.0 psi / minute De-Compression Rate Up: 2.0 psi / minute Air breaks and CompressTx Pressure breathing periods DecompressDecompress Begins Reached (leave unused spaces Begins Ends blank) Chamber Pressure (ATA)1 2.5 2.5 2.5 2.5 2.5 --2.5 1 Clock Time (24 hr) 13:12 13:24 13:5413:5914:2914:34--15:04 15:16 Treatment Length: 124 (minutes) Treatment Segments: 4 Vital Signs Capillary Blood Glucose Reference Range: 80 - 120 mg / dl HBO Diabetic Blood Glucose Intervention Range: <131 mg/dl or >956 mg/dl Time Vitals Blood Respiratory Capillary Blood Glucose Pulse Action Type: Pulse: Temperature: Taken: Pressure: Rate: Glucose (mg/dl): Meter #: Oximetry (%) Taken: Pre 13:00 129/69 65 15 97.8 Post 15:20 141/62 50 14 97.6 Treatment Response Treatment Toleration: Well Treatment Completion Treatment Completed without Adverse Event Status: Param Capri Notes No concerns with treatment given Physician HBO Attestation: I certify that I supervised this HBO treatment in accordance with Medicare guidelines. A trained Yes Yes emergency  response team is readily available per hospital policies and procedures. Continue HBOT as ordered. Yes Electronic Signature(s) Signed: 07/25/2019 5:53:37 PM By: Baltazar Najjar MD Previous Signature: 07/25/2019 4:34:42 PM Version By: Benjaman Kindler EMT/HBOT Entered By: Baltazar Najjar on 07/25/2019 17:51:28 -------------------------------------------------------------------------------- HBO Safety Checklist Details Patient Name: Date of Service: [name]. 07/25/2019 1:00 PM Medical Record [id number] Patient Account Number: [id number] Date of Birth/Sex: Treating RN: Dec 27, 1945 (73 y.o. M) Primary Care Zhaniya Swallows: SYSTEM, PCP Other Clinician: Benjaman Kindler Referring Itzayanna Kaster: Treating Choua Chalker/Extender:Robson, Lamar Sprinkles in Treatment: 6 HBO Safety Checklist Items Safety Checklist Consent Form Signed Patient voided / foley secured and emptied When did you last eato n/a Last dose of injectable or oral agent n/a NA Ostomy pouch emptied and vented if applicable NA All implantable devices assessed, documented and approved NA Intravenous access site secured and place Valuables secured Linens and cotton and cotton/polyester blend (less than 51% polyester) Personal oil-based products / skin lotions / body lotions removed NA Wigs or hairpieces removed NA Smoking or tobacco materials removed Books / newspapers / magazines / loose paper removed Cologne, aftershave, perfume and deodorant removed Jewelry removed (may wrap wedding band) NA Make-up removed Hair care products removed NA Battery operated devices (external) removed NA Heating patches and chemical warmers removed NA Titanium eyewear removed NA Nail polish cured greater than 10 hours NA Casting material cured greater than 10 hours NA Hearing aids removed NA Loose dentures or partials removed NA Prosthetics have been removed Patient demonstrates correct use of air break device (if applicable) Patient concerns have  been addressed Patient grounding bracelet on and cord attached to chamber Specifics for Inpatients (complete in addition to above) Medication sheet sent with patient Intravenous medications needed or due during therapy sent with patient Drainage tubes (e.g. nasogastric tube or chest tube secured and vented) Endotracheal or Tracheotomy tube secured Cuff deflated of air and inflated with saline Airway suctioned  Electronic Signature(s) Signed: 07/25/2019 1:36:30 PM By: Benjaman Kindler EMT/HBOT Entered By: Benjaman Kindler on 07/25/2019 13:36:28

## 2019-07-25 NOTE — Progress Notes (Signed)
[name], [name] ([id number]) Visit Report for 06/12/2019 Allergy List Details Patient Name: Date of Service: [name], [name] 06/12/2019 9:00 AM Medical Record [id number] Patient Account Number: [id number] Date of Birth/Sex: Treating RN: 09-Oct-1945 (73 y.o. Jerilynn Mages) Carlene Coria Primary Care Kareli Hossain: SYSTEM, PCP Other Clinician: Referring Christa Fasig: Treating Ygnacio Fecteau/Extender:Robson, [name] in Treatment: 0 Allergies Active Allergies aspartame bupropion Allergy Notes Electronic Signature(s) Signed: 07/25/2019 3:01:15 PM By: Carlene Coria RN Entered By: Carlene Coria on 06/12/2019 09:19:25 -------------------------------------------------------------------------------- Arrival Information Details Patient Name: Date of Service: [name] 06/12/2019 9:00 AM Medical Record [id number] Patient Account Number: [id number] Date of Birth/Sex: Treating RN: [birth year] (73 y.o. Jerilynn Mages) Carlene Coria Primary Care Itali Mckendry: SYSTEM, PCP Other Clinician: Referring Keayra Graham: Treating Burch Marchuk/Extender:Robson, [name] in Treatment: 0 Visit Information Patient Arrived: Ambulatory Arrival Time: 08:55 Accompanied By: self Transfer Assistance: None Patient Identification Verified: Yes Secondary Verification Process Completed: Yes Patient Requires Transmission-Based No Precautions: Patient Has Alerts: No Electronic Signature(s) Signed: 07/25/2019 3:01:15 PM By: Carlene Coria RN Entered By: Carlene Coria on 06/12/2019 09:16:40 -------------------------------------------------------------------------------- Clinic Level of Care Assessment Details Patient Name: Date of Service: [name], [name] 06/12/2019 9:00 AM Medical Record [id number] Patient Account Number: [id number] Date of Birth/Sex: Treating RN: 1946-05-01 (73 y.o. [name] Primary Care Ejay Lashley: SYSTEM, PCP Other Clinician: Referring Aayansh Codispoti: Treating Jibril Mcminn/Extender:Robson, [name] in Treatment:  0 Clinic Level of Care Assessment Items TOOL 2 Quantity Score X - Use when only an EandM is performed on the INITIAL visit 1 0 ASSESSMENTS - Nursing Assessment / Reassessment X - General Physical Exam (combine w/ comprehensive assessment (listed just below) 1 20 when performed on new pt. evals) X - Comprehensive Assessment (HX, ROS, Risk Assessments, Wounds Hx, etc.) 1 25 ASSESSMENTS - Wound and Skin Assessment / Reassessment []  - Simple Wound Assessment / Reassessment - one wound 0 []  - Complex Wound Assessment / Reassessment - multiple wounds 0 []  - Dermatologic / Skin Assessment (not related to wound area) 0 ASSESSMENTS - Ostomy and/or Continence Assessment and Care []  - Incontinence Assessment and Management 0 []  - Ostomy Care Assessment and Management (repouching, etc.) 0 PROCESS - Coordination of Care X - Simple Patient / Family Education for ongoing care 1 15 []  - Complex (extensive) Patient / Family Education for ongoing care 0 X - Staff obtains Programmer, systems, Records, Test Results / Process Orders 1 10 []  - Staff telephones HHA, Nursing Homes / Clarify orders / etc 0 []  - Routine Transfer to another Facility (non-emergent condition) 0 []  - Routine Hospital Admission (non-emergent condition) 0 X - New Admissions / Biomedical engineer / Ordering NPWT, Apligraf, etc. 1 15 []  - Emergency Hospital Admission (emergent condition) 0 X - Simple Discharge Coordination 1 10 []  - Complex (extensive) Discharge Coordination 0 PROCESS - Special Needs []  - Pediatric / Minor Patient Management 0 []  - Isolation Patient Management 0 []  - Hearing / Language / Visual special needs 0 []  - Assessment of Community assistance (transportation, D/C planning, etc.) 0 []  - Additional assistance / Altered mentation 0 []  - Support Surface(s) Assessment (bed, cushion, seat, etc.) 0 INTERVENTIONS - Wound Cleansing / Measurement []  - Wound Imaging (photographs - any number of wounds) 0 []  - Wound Tracing  (instead of photographs) 0 []  - Simple Wound Measurement - one wound 0 []  - Complex Wound Measurement - multiple wounds 0 []  - Simple Wound Cleansing - one wound 0 []  - Complex Wound Cleansing - multiple wounds 0 INTERVENTIONS - Wound Dressings []  - Small Wound Dressing one  or multiple wounds 0 []  - Medium Wound Dressing one or multiple wounds 0 []  - Large Wound Dressing one or multiple wounds 0 []  - Application of Medications - injection 0 INTERVENTIONS - Miscellaneous []  - External ear exam 0 []  - Specimen Collection (cultures, biopsies, blood, body fluids, etc.) 0 []  - Specimen(s) / Culture(s) sent or taken to Lab for analysis 0 []  - Patient Transfer (multiple staff / Harrel Lemon Lift / Similar devices) 0 []  - Simple Staple / Suture removal (25 or less) 0 []  - Complex Staple / Suture removal (26 or more) 0 []  - Hypo / Hyperglycemic Management (close monitor of Blood Glucose) 0 []  - Ankle / Brachial Index (ABI) - do not check if billed separately 0 Has the patient been seen at the hospital within the last three years: Yes Total Score: 95 Level Of Care: New/Established - Level 3 Electronic Signature(s) Signed: 06/12/2019 6:04:10 PM By: Levan Hurst RN, BSN Entered By: Levan Hurst on 06/12/2019 11:53:08 -------------------------------------------------------------------------------- Encounter Discharge Information Details Patient Name: Date of Service: [name]. 06/12/2019 9:00 AM Medical Record [id number] Patient Account Number: [id number] Date of Birth/Sex: Treating RN: [birth year] (73 y.o. Hessie Diener Primary Care Joelly Bolanos: SYSTEM, PCP Other Clinician: Referring Tage Feggins: Treating Marlies Ligman/Extender:Robson, [name] in Treatment: 0 Encounter Discharge Information Items Discharge Condition: Stable Ambulatory Status: Ambulatory Discharge Destination: Home Transportation: Private Auto Accompanied By: self Schedule Follow-up Appointment: No Clinical Summary  of Care: Notes Patient to HBO room for orientation with HBO tech. Electronic Signature(s) Signed: 06/12/2019 5:31:38 PM By: Deon Pilling Entered By: Deon Pilling on 06/12/2019 10:29:58 -------------------------------------------------------------------------------- Multi Wound Chart Details Patient Name: Date of Service: [name]. 06/12/2019 9:00 AM Medical Record [id number] Patient Account Number: [id number] Date of Birth/Sex: Treating RN: Dec 31, 1945 (73 y.o. [name] Primary Care Rosamae Rocque: SYSTEM, PCP Other Clinician: Referring Karson Chicas: Treating Linley Moskal/Extender:Robson, [name] in Treatment: 0 Vital Signs Height(in): 70 Pulse(bpm): 74 Weight(lbs): 152 Blood Pressure(mmHg): 131/63 Body Mass Index(BMI): 22 Body Mass Index(BMI): 22 Temperature(F): 98.4 Respiratory 18 Rate(breaths/min): Wound Assessments Treatment Notes Electronic Signature(s) Signed: 06/12/2019 5:48:14 PM By: [name] MD Signed: 06/12/2019 6:04:10 PM By: Levan Hurst RN, BSN Entered By: [name] on 06/12/2019 11:06:42 -------------------------------------------------------------------------------- Multi-Disciplinary Care Plan Details Patient Name: Date of Service: [name]. 06/12/2019 9:00 AM Medical Record [id number] Patient Account Number: [id number] Date of Birth/Sex: Treating RN: 07/11/1946 (72 y.o. 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 procedures Discomfort related to temperature and humidity changes inside hyperbaric chamber Potential for barotraumas to ears, sinuses, teeth, and lungs or cerebral gas embolism related  to changes in atmospheric pressure inside hyperbaric oxygen chamber Potential for oxygen toxicity seizures related to delivery of 100% oxygen at an increased atmospheric pressure Potential for pulmonary oxygen toxicity related to delivery of 100% oxygen at an increased atmospheric pressure Goals: Barotrauma will be prevented during HBO2 Date Initiated: 06/12/2019 Target Resolution Date: 08/17/2019 Goal Status: Active Patient and/or family will be able to state/discuss factors appropriate to the management of their disease process during treatment Date Initiated: 06/12/2019 Target Resolution Date: 08/17/2019 Goal Status: Active Patient will tolerate the hyperbaric oxygen therapy treatment Date Initiated: 06/12/2019 Target Resolution Date: 08/17/2019 Goal Status: Active Patient will tolerate the internal climate of the chamber Date Initiated: 06/12/2019 Target Resolution  [name], [name] ([id number]) Visit Report for 06/12/2019 Allergy List Details Patient Name: Date of Service: [name], [name] 06/12/2019 9:00 AM Medical Record [id number] Patient Account Number: [id number] Date of Birth/Sex: Treating RN: 09-Oct-1945 (73 y.o. Jerilynn Mages) Carlene Coria Primary Care Kareli Hossain: SYSTEM, PCP Other Clinician: Referring Christa Fasig: Treating Ygnacio Fecteau/Extender:Robson, [name] in Treatment: 0 Allergies Active Allergies aspartame bupropion Allergy Notes Electronic Signature(s) Signed: 07/25/2019 3:01:15 PM By: Carlene Coria RN Entered By: Carlene Coria on 06/12/2019 09:19:25 -------------------------------------------------------------------------------- Arrival Information Details Patient Name: Date of Service: [name] 06/12/2019 9:00 AM Medical Record [id number] Patient Account Number: [id number] Date of Birth/Sex: Treating RN: [birth year] (73 y.o. Jerilynn Mages) Carlene Coria Primary Care Itali Mckendry: SYSTEM, PCP Other Clinician: Referring Keayra Graham: Treating Burch Marchuk/Extender:Robson, [name] in Treatment: 0 Visit Information Patient Arrived: Ambulatory Arrival Time: 08:55 Accompanied By: self Transfer Assistance: None Patient Identification Verified: Yes Secondary Verification Process Completed: Yes Patient Requires Transmission-Based No Precautions: Patient Has Alerts: No Electronic Signature(s) Signed: 07/25/2019 3:01:15 PM By: Carlene Coria RN Entered By: Carlene Coria on 06/12/2019 09:16:40 -------------------------------------------------------------------------------- Clinic Level of Care Assessment Details Patient Name: Date of Service: [name], [name] 06/12/2019 9:00 AM Medical Record [id number] Patient Account Number: [id number] Date of Birth/Sex: Treating RN: 1946-05-01 (73 y.o. [name] Primary Care Ejay Lashley: SYSTEM, PCP Other Clinician: Referring Aayansh Codispoti: Treating Jibril Mcminn/Extender:Robson, [name] in Treatment:  0 Clinic Level of Care Assessment Items TOOL 2 Quantity Score X - Use when only an EandM is performed on the INITIAL visit 1 0 ASSESSMENTS - Nursing Assessment / Reassessment X - General Physical Exam (combine w/ comprehensive assessment (listed just below) 1 20 when performed on new pt. evals) X - Comprehensive Assessment (HX, ROS, Risk Assessments, Wounds Hx, etc.) 1 25 ASSESSMENTS - Wound and Skin Assessment / Reassessment []  - Simple Wound Assessment / Reassessment - one wound 0 []  - Complex Wound Assessment / Reassessment - multiple wounds 0 []  - Dermatologic / Skin Assessment (not related to wound area) 0 ASSESSMENTS - Ostomy and/or Continence Assessment and Care []  - Incontinence Assessment and Management 0 []  - Ostomy Care Assessment and Management (repouching, etc.) 0 PROCESS - Coordination of Care X - Simple Patient / Family Education for ongoing care 1 15 []  - Complex (extensive) Patient / Family Education for ongoing care 0 X - Staff obtains Programmer, systems, Records, Test Results / Process Orders 1 10 []  - Staff telephones HHA, Nursing Homes / Clarify orders / etc 0 []  - Routine Transfer to another Facility (non-emergent condition) 0 []  - Routine Hospital Admission (non-emergent condition) 0 X - New Admissions / Biomedical engineer / Ordering NPWT, Apligraf, etc. 1 15 []  - Emergency Hospital Admission (emergent condition) 0 X - Simple Discharge Coordination 1 10 []  - Complex (extensive) Discharge Coordination 0 PROCESS - Special Needs []  - Pediatric / Minor Patient Management 0 []  - Isolation Patient Management 0 []  - Hearing / Language / Visual special needs 0 []  - Assessment of Community assistance (transportation, D/C planning, etc.) 0 []  - Additional assistance / Altered mentation 0 []  - Support Surface(s) Assessment (bed, cushion, seat, etc.) 0 INTERVENTIONS - Wound Cleansing / Measurement []  - Wound Imaging (photographs - any number of wounds) 0 []  - Wound Tracing  (instead of photographs) 0 []  - Simple Wound Measurement - one wound 0 []  - Complex Wound Measurement - multiple wounds 0 []  - Simple Wound Cleansing - one wound 0 []  - Complex Wound Cleansing - multiple wounds 0 INTERVENTIONS - Wound Dressings []  - Small Wound Dressing one

## 2019-07-25 NOTE — Progress Notes (Signed)
[name], [name] ([id number]) Visit Report for 07/24/2019 SuperBill Details Patient Name: Date of Service: [name], [name] 07/24/2019 Medical Record [id number] Patient Account Number: [id number] Date of Birth/Sex: Treating RN: [birth year] (73 y.o. 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

## 2019-07-25 NOTE — Progress Notes (Signed)
has myringotomy tubes. His oropharynx looked normal. He has a few remaining teeth on the left maxilla and across anteriorly on the mandible. In the hard palate anteriorly into the right there is a defect connecting with a large open area just below his right nostril.Marland Kitchen Respiratory work of breathing is normal. Bilateral breath sounds are clear and equal in all lobes with no wheezes, rales or rhonchi.. Cardiovascular Heart rhythm and rate regular, without murmur or gallop.. Integumentary (Hair, Skin) No primary cutaneous issues are seen. Psychiatric appears at normal baseline. Electronic Signature(s) Signed: 06/12/2019 5:48:14 PM By: [name] MD Entered By: [name] on 06/12/2019 11:15:03 -------------------------------------------------------------------------------- Physician Orders Details Patient Name: Date of Service: [name]. 06/12/2019 9:00 AM Medical Record [id number] Patient Account Number: [id number] Date of Birth/Sex: Treating RN: 10/02/1945 (73 y.o. [name] Primary Care Provider: SYSTEM, PCP Other Clinician: Referring Provider: Treating Provider/Extender:Byanka Landrus, [name] in Treatment: 0 Verbal / Phone Orders: No Diagnosis Coding Follow-up Appointments Return Appointment in: - We will call to schedule date and time for Hyperbarics after we get insurance approval Radiology X-ray, Chest PA and lateral - Evaluation for Hyperbaric Oxygen Therapy [facility] EKG - Evaluation for Hyperbaric Oxygen Therapy Electronic Signature(s) Signed: 06/12/2019 5:48:14 PM By: [name] MD Signed: 06/12/2019 6:04:10  PM By: Levan Hurst RN, BSN Entered By: Levan Hurst on 06/12/2019 10:20:11 -------------------------------------------------------------------------------- Prescription 06/12/2019 Patient Name: [name] Provider: [name] MD Date of Birth: [birth year] NPI#: [id number] Sex: M DEA#: [id number] Phone #: [phone] License #: [id number] Patient Address: [place] [id number] [place], [place] [address] [place], [place] [address] [phone] Allergies aspartame bupropion Provider's Orders X-ray, Chest PA and lateral - Evaluation for Hyperbaric Oxygen Therapy Signature(s): Date(s): Prescription 06/12/2019 Patient Name: [name] Provider: [name] MD Date of Birth: May 21, 1946 NPI#: [id number] Sex: Jerilynn Mages DEA#: [id number] Phone #: [phone] License #: [id number] Patient Address: [place] [id number] [place], [place] [address] [place], [place] [address] [phone] Allergies aspartame bupropion Provider's Orders EKG - Evaluation for Hyperbaric Oxygen Therapy Signature(s): Date(s): Electronic Signature(s) Signed: 06/12/2019 5:48:14 PM By: [name] MD Signed: 06/12/2019 6:04:10 PM By: Levan Hurst RN, BSN Entered By: Levan Hurst on 06/12/2019 10:20:12 --------------------------------------------------------------------------------  Problem List Details Patient Name: Date of Service: [name]. 06/12/2019 9:00 AM Medical Record [id number] Patient Account Number: [id number] Date of Birth/Sex: Treating RN: [birth year] (73 y.o. 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 06/12/2019 9:00 AM Medical Record [id number] Patient Account Number: [id number] Date of Birth/Sex: Treating RN: November 16, 1945 (73 y.o. 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 06/12/2019 9:00 AM Medical Record [id number] Patient Account Number: [id number] Date of Birth/Sex: Treating RN: [birth year] (73 y.o. Jerilynn Mages) [name] Primary Care Provider: SYSTEM, PCP Other Clinician: Referring Provider: Treating Provider/Extender:Leodan Bolyard, [name] in Treatment: 0 Information Obtained From Patient Constitutional Symptoms (General Health) Complaints and  Symptoms: Negative for: Fatigue; Fever; Chills; Marked Weight Change Eyes Complaints and Symptoms: Negative for: Dry Eyes; Vision Changes; Glasses / Contacts Medical History: Negative for: Cataracts; Glaucoma; Optic Neuritis Ear/Nose/Mouth/Throat Complaints and Symptoms: Positive for: Chronic sinus problems or rhinitis Medical History: Negative for: Chronic sinus problems/congestion; Middle ear problems Past Medical History Notes: tubes placed sept 2020 Respiratory Complaints and Symptoms: Negative for: Chronic or frequent coughs; Shortness of Breath Medical History: Negative for: Aspiration; Asthma; Chronic Obstructive Pulmonary Disease (COPD); Pneumothorax; Sleep Apnea; Tuberculosis Cardiovascular Complaints and Symptoms: Negative for: Chest pain Medical History: Positive for: Coronary Artery Disease; Hypertension Negative for: Angina; Arrhythmia; Congestive Heart Failure; Deep Vein Thrombosis; Hypotension; Myocardial Infarction; Peripheral Arterial Disease; Peripheral Venous Disease; Phlebitis; Vasculitis Gastrointestinal Complaints and Symptoms: Negative for: Frequent diarrhea; Nausea; Vomiting Medical History: Negative for: Cirrhosis ; Colitis; Crohns; Hepatitis A; Hepatitis B Endocrine Complaints and Symptoms: Negative for: Heat/cold intolerance Medical History: Negative for: Type I Diabetes; Type II Diabetes Genitourinary Complaints and Symptoms: Negative for: Frequent urination Medical History: Negative for: End Stage Renal Disease Integumentary (Skin) Complaints and Symptoms: Negative for: Wounds Medical History: Negative for: History of Burn Musculoskeletal Complaints and Symptoms: Negative for: Muscle Pain; Muscle Weakness Medical History: Negative for: Gout; Rheumatoid Arthritis; Osteoarthritis; Osteomyelitis Neurologic Complaints and Symptoms: Negative for: Numbness/parasthesias Medical History: Negative for: Dementia; Neuropathy; Quadriplegia;  Paraplegia; Seizure Disorder Psychiatric Complaints and Symptoms: Negative for: Claustrophobia; Suicidal Medical History: Negative for: Anorexia/bulimia; Confinement Anxiety Hematologic/Lymphatic Medical History: Negative for: Anemia; Hemophilia; Human Immunodeficiency Virus; Lymphedema; Sickle Cell Disease Immunological Medical History: Negative for: Lupus Erythematosus; Raynauds; Scleroderma Oncologic Medical History: Positive for: Received Radiation - nose Negative for: Received Chemotherapy Past Medical History Notes: last dose 01/06/18 Immunizations Pneumococcal Vaccine: Received Pneumococcal Vaccination: No Implantable Devices None Family and Social History Cancer: Yes - Father; Diabetes: No; Heart Disease: Yes - Mother,Father,Siblings; Hereditary Spherocytosis: No; Hypertension: No; Kidney Disease: No; Lung Disease: Yes - Father; Seizures: No; Stroke: No; Thyroid Problems: No; Tuberculosis: No; Former smoker; Marital Status - Divorced; Alcohol Use: Never; Drug Use: No History; Caffeine Use: Never; Financial Concerns: No; Food, Clothing or Shelter Needs: No; Support System Lacking: No; Transportation Concerns: No Electronic Signature(s) Signed: 06/12/2019 5:48:14 PM By: [name] MD Signed: 07/25/2019 3:01:15 PM By: [name] Coria RN Entered By: [name] on 06/12/2019 09:24:26 -------------------------------------------------------------------------------- [place] Details Patient Name: Date of Service: [name] 06/12/2019 Medical Record [id number] Patient Account Number: [id number] Date of Birth/Sex: Treating RN: 1945/12/18 (73 y.o. 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 His oropharynx looked normal. He has a few remaining teeth on the left maxilla and across anteriorly on the mandible. In the hard palate anteriorly into the right there is a defect connecting with a large open area just below his right nostril.Marland Kitchen Respiratory work of breathing is normal. Bilateral breath sounds are clear and equal in all lobes with no wheezes, rales or rhonchi.. Cardiovascular Heart rhythm and rate regular, without murmur or gallop.. Integumentary (Hair, Skin) No primary cutaneous issues are seen. Psychiatric appears at normal baseline. Electronic Signature(s) Signed: 06/12/2019 5:48:14 PM By: [name] MD Entered By: [name] on 06/12/2019 11:15:03 -------------------------------------------------------------------------------- Physician Orders Details Patient Name: Date of Service: [name]. 06/12/2019 9:00 AM Medical Record [id number] Patient Account Number: [id number] Date of Birth/Sex: Treating RN: 10/02/1945 (73 y.o. [name] Primary Care Provider: SYSTEM, PCP Other Clinician: Referring Provider: Treating Provider/Extender:Byanka Landrus, [name] in Treatment: 0 Verbal / Phone Orders: No Diagnosis Coding Follow-up Appointments Return Appointment in: - We will call to schedule date and time for Hyperbarics after we get insurance approval Radiology X-ray, Chest PA and lateral - Evaluation for Hyperbaric Oxygen Therapy [facility] EKG - Evaluation for Hyperbaric Oxygen Therapy Electronic Signature(s) Signed: 06/12/2019 5:48:14 PM By: [name] MD Signed: 06/12/2019 6:04:10  PM By: Levan Hurst RN, BSN Entered By: Levan Hurst on 06/12/2019 10:20:11 -------------------------------------------------------------------------------- Prescription 06/12/2019 Patient Name: [name] Provider: [name] MD Date of Birth: [birth year] NPI#: [id number] Sex: M DEA#: [id number] Phone #: [phone] License #: [id number] Patient Address: [place] [id number] [place], [place] [address] [place], [place] [address] [phone] Allergies aspartame bupropion Provider's Orders X-ray, Chest PA and lateral - Evaluation for Hyperbaric Oxygen Therapy Signature(s): Date(s): Prescription 06/12/2019 Patient Name: [name] Provider: [name] MD Date of Birth: May 21, 1946 NPI#: [id number] Sex: Jerilynn Mages DEA#: [id number] Phone #: [phone] License #: [id number] Patient Address: [place] [id number] [place], [place] [address] [place], [place] [address] [phone] Allergies aspartame bupropion Provider's Orders EKG - Evaluation for Hyperbaric Oxygen Therapy Signature(s): Date(s): Electronic Signature(s) Signed: 06/12/2019 5:48:14 PM By: [name] MD Signed: 06/12/2019 6:04:10 PM By: Levan Hurst RN, BSN Entered By: Levan Hurst on 06/12/2019 10:20:12 --------------------------------------------------------------------------------  Problem List Details Patient Name: Date of Service: [name]. 06/12/2019 9:00 AM Medical Record [id number] Patient Account Number: [id number] Date of Birth/Sex: Treating RN: [birth year] (73 y.o. 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 06/12/2019 9:00 AM Medical Record [id number] Patient Account Number: [id number] Date of Birth/Sex: Treating RN: November 16, 1945 (73 y.o. 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 His oropharynx looked normal. He has a few remaining teeth on the left maxilla and across anteriorly on the mandible. In the hard palate anteriorly into the right there is a defect connecting with a large open area just below his right nostril.Marland Kitchen Respiratory work of breathing is normal. Bilateral breath sounds are clear and equal in all lobes with no wheezes, rales or rhonchi.. Cardiovascular Heart rhythm and rate regular, without murmur or gallop.. Integumentary (Hair, Skin) No primary cutaneous issues are seen. Psychiatric appears at normal baseline. Electronic Signature(s) Signed: 06/12/2019 5:48:14 PM By: [name] MD Entered By: [name] on 06/12/2019 11:15:03 -------------------------------------------------------------------------------- Physician Orders Details Patient Name: Date of Service: [name]. 06/12/2019 9:00 AM Medical Record [id number] Patient Account Number: [id number] Date of Birth/Sex: Treating RN: 10/02/1945 (73 y.o. [name] Primary Care Provider: SYSTEM, PCP Other Clinician: Referring Provider: Treating Provider/Extender:Byanka Landrus, [name] in Treatment: 0 Verbal / Phone Orders: No Diagnosis Coding Follow-up Appointments Return Appointment in: - We will call to schedule date and time for Hyperbarics after we get insurance approval Radiology X-ray, Chest PA and lateral - Evaluation for Hyperbaric Oxygen Therapy [facility] EKG - Evaluation for Hyperbaric Oxygen Therapy Electronic Signature(s) Signed: 06/12/2019 5:48:14 PM By: [name] MD Signed: 06/12/2019 6:04:10  PM By: Levan Hurst RN, BSN Entered By: Levan Hurst on 06/12/2019 10:20:11 -------------------------------------------------------------------------------- Prescription 06/12/2019 Patient Name: [name] Provider: [name] MD Date of Birth: [birth year] NPI#: [id number] Sex: M DEA#: [id number] Phone #: [phone] License #: [id number] Patient Address: [place] [id number] [place], [place] [address] [place], [place] [address] [phone] Allergies aspartame bupropion Provider's Orders X-ray, Chest PA and lateral - Evaluation for Hyperbaric Oxygen Therapy Signature(s): Date(s): Prescription 06/12/2019 Patient Name: [name] Provider: [name] MD Date of Birth: May 21, 1946 NPI#: [id number] Sex: Jerilynn Mages DEA#: [id number] Phone #: [phone] License #: [id number] Patient Address: [place] [id number] [place], [place] [address] [place], [place] [address] [phone] Allergies aspartame bupropion Provider's Orders EKG - Evaluation for Hyperbaric Oxygen Therapy Signature(s): Date(s): Electronic Signature(s) Signed: 06/12/2019 5:48:14 PM By: [name] MD Signed: 06/12/2019 6:04:10 PM By: Levan Hurst RN, BSN Entered By: Levan Hurst on 06/12/2019 10:20:12 --------------------------------------------------------------------------------  Problem List Details Patient Name: Date of Service: [name]. 06/12/2019 9:00 AM Medical Record [id number] Patient Account Number: [id number] Date of Birth/Sex: Treating RN: [birth year] (73 y.o. 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 06/12/2019 9:00 AM Medical Record [id number] Patient Account Number: [id number] Date of Birth/Sex: Treating RN: November 16, 1945 (73 y.o. 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 06/12/2019 9:00 AM Medical Record [id number] Patient Account Number: [id number] Date of Birth/Sex: Treating RN: [birth year] (73 y.o. Jerilynn Mages) [name] Primary Care Provider: SYSTEM, PCP Other Clinician: Referring Provider: Treating Provider/Extender:Leodan Bolyard, [name] in Treatment: 0 Information Obtained From Patient Constitutional Symptoms (General Health) Complaints and  Symptoms: Negative for: Fatigue; Fever; Chills; Marked Weight Change Eyes Complaints and Symptoms: Negative for: Dry Eyes; Vision Changes; Glasses / Contacts Medical History: Negative for: Cataracts; Glaucoma; Optic Neuritis Ear/Nose/Mouth/Throat Complaints and Symptoms: Positive for: Chronic sinus problems or rhinitis Medical History: Negative for: Chronic sinus problems/congestion; Middle ear problems Past Medical History Notes: tubes placed sept 2020 Respiratory Complaints and Symptoms: Negative for: Chronic or frequent coughs; Shortness of Breath Medical History: Negative for: Aspiration; Asthma; Chronic Obstructive Pulmonary Disease (COPD); Pneumothorax; Sleep Apnea; Tuberculosis Cardiovascular Complaints and Symptoms: Negative for: Chest pain Medical History: Positive for: Coronary Artery Disease; Hypertension Negative for: Angina; Arrhythmia; Congestive Heart Failure; Deep Vein Thrombosis; Hypotension; Myocardial Infarction; Peripheral Arterial Disease; Peripheral Venous Disease; Phlebitis; Vasculitis Gastrointestinal Complaints and Symptoms: Negative for: Frequent diarrhea; Nausea; Vomiting Medical History: Negative for: Cirrhosis ; Colitis; Crohns; Hepatitis A; Hepatitis B Endocrine Complaints and Symptoms: Negative for: Heat/cold intolerance Medical History: Negative for: Type I Diabetes; Type II Diabetes Genitourinary Complaints and Symptoms: Negative for: Frequent urination Medical History: Negative for: End Stage Renal Disease Integumentary (Skin) Complaints and Symptoms: Negative for: Wounds Medical History: Negative for: History of Burn Musculoskeletal Complaints and Symptoms: Negative for: Muscle Pain; Muscle Weakness Medical History: Negative for: Gout; Rheumatoid Arthritis; Osteoarthritis; Osteomyelitis Neurologic Complaints and Symptoms: Negative for: Numbness/parasthesias Medical History: Negative for: Dementia; Neuropathy; Quadriplegia;  Paraplegia; Seizure Disorder Psychiatric Complaints and Symptoms: Negative for: Claustrophobia; Suicidal Medical History: Negative for: Anorexia/bulimia; Confinement Anxiety Hematologic/Lymphatic Medical History: Negative for: Anemia; Hemophilia; Human Immunodeficiency Virus; Lymphedema; Sickle Cell Disease Immunological Medical History: Negative for: Lupus Erythematosus; Raynauds; Scleroderma Oncologic Medical History: Positive for: Received Radiation - nose Negative for: Received Chemotherapy Past Medical History Notes: last dose 01/06/18 Immunizations Pneumococcal Vaccine: Received Pneumococcal Vaccination: No Implantable Devices None Family and Social History Cancer: Yes - Father; Diabetes: No; Heart Disease: Yes - Mother,Father,Siblings; Hereditary Spherocytosis: No; Hypertension: No; Kidney Disease: No; Lung Disease: Yes - Father; Seizures: No; Stroke: No; Thyroid Problems: No; Tuberculosis: No; Former smoker; Marital Status - Divorced; Alcohol Use: Never; Drug Use: No History; Caffeine Use: Never; Financial Concerns: No; Food, Clothing or Shelter Needs: No; Support System Lacking: No; Transportation Concerns: No Electronic Signature(s) Signed: 06/12/2019 5:48:14 PM By: [name] MD Signed: 07/25/2019 3:01:15 PM By: [name] Coria RN Entered By: [name] on 06/12/2019 09:24:26 -------------------------------------------------------------------------------- [place] Details Patient Name: Date of Service: [name] 06/12/2019 Medical Record [id number] Patient Account Number: [id number] Date of Birth/Sex: Treating RN: 1945/12/18 (73 y.o. 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

## 2019-07-25 NOTE — Progress Notes (Signed)
[name], [name] ([id number]) Visit Report for 06/12/2019 Abuse/Suicide Risk Screen Details Patient Name: Date of Service: [name], [name] 06/12/2019 9:00 AM Medical Record [id number] Patient Account Number: [id number] Date of Birth/Sex: Treating RN: 1945-11-03 (73 y.o. [name]) Yevonne Pax Primary Care Kenyanna Grzesiak: SYSTEM, PCP Other Clinician: Referring Morio Widen: Treating Kellis Topete/Extender:Robson, Lamar Sprinkles in Treatment: 0 Abuse/Suicide Risk Screen Items Answer ABUSE RISK SCREEN: Has anyone close to you tried to hurt or harm you recentlyo No Do you feel uncomfortable with anyone in your familyo No Has anyone forced you do things that you didnt want to doo No Electronic Signature(s) Signed: 07/25/2019 3:01:15 PM By: Yevonne Pax RN Entered By: Yevonne Pax on 06/12/2019 09:24:40 -------------------------------------------------------------------------------- Activities of Daily Living Details Patient Name: Date of Service: [name], [name] 06/12/2019 9:00 AM Medical Record [id number] Patient Account Number: [id number] Date of Birth/Sex: Treating RN: [birth year] (73 y.o. [name]) Yevonne Pax Primary Care Kiyani Jernigan: SYSTEM, PCP Other Clinician: Referring [place] Mckeough: Treating Jaymes Hang/Extender:Robson, Lamar Sprinkles in Treatment: 0 Activities of Daily Living Items Answer Activities of Daily Living (Please select one for each item) Drive Automobile Completely Able Take Medications Completely Able Use Telephone Completely Able Care for Appearance Completely Able Use Toilet Completely Able Bath / Shower Completely Able Dress Self Completely Able Feed Self Completely Able Walk Completely Able Get In / Out Bed Completely Able Housework Completely Able Prepare Meals Completely Able Handle Money Completely Able Shop for Self Completely Able Electronic Signature(s) Signed: 07/25/2019 3:01:15 PM By: Yevonne Pax RN Entered By: Yevonne Pax on 06/12/2019  09:25:12 -------------------------------------------------------------------------------- Education Screening Details Patient Name: Date of Service: [name] 06/12/2019 9:00 AM Medical Record [id number] Patient Account Number: [id number] Date of Birth/Sex: Treating RN: [birth year] (73 y.o. [name]) Yevonne Pax Primary Care Chuckie Mccathern: SYSTEM, PCP Other Clinician: Referring An Lannan: Treating Alga Southall/Extender:Robson, Lamar Sprinkles in Treatment: 0 Learning Preferences/Education Level/Primary Language Learning Preference: Explanation Highest Education Level: High School Preferred Language: English Cognitive Barrier Language Barrier: No Translator Needed: No Memory Deficit: No Emotional Barrier: No Cultural/Religious Beliefs Affecting Medical Care: No Physical Barrier Impaired Vision: No Impaired Hearing: No Decreased Hand dexterity: No Knowledge/Comprehension Knowledge Level: High Comprehension Level: High Ability to understand written High instructions: Ability to understand verbal High instructions: Motivation Anxiety Level: Calm Cooperation: Cooperative Education Importance: Acknowledges Need Interest in Health Problems: Asks Questions Perception: Coherent Willingness to Engage in Self- High Management Activities: Readiness to Engage in Self- High Management Activities: Electronic Signature(s) Signed: 07/25/2019 3:01:15 PM By: Yevonne Pax RN Entered By: Yevonne Pax on 06/12/2019 09:25:40 -------------------------------------------------------------------------------- Fall Risk Assessment Details Patient Name: Date of Service: [name]. 06/12/2019 9:00 AM Medical Record [id number] Patient Account Number: [id number] Date of Birth/Sex: Treating RN: [birth year] (73 y.o. [name]) Yevonne Pax Primary Care Prentis Langdon: SYSTEM, PCP Other Clinician: Referring Sparrow Sanzo: Treating Tula Schryver/Extender:Robson, Lamar Sprinkles in Treatment: 0 Fall Risk Assessment  Items Have you had 2 or more falls in the last 12 monthso 0 No Have you had any fall that resulted in injury in the last 12 monthso 0 No FALLS RISK SCREEN History of falling - immediate or within 3 months 0 No Secondary diagnosis (Do you have 2 or more medical diagnoseso) 0 No Ambulatory aid None/bed rest/wheelchair/nurse 0 No Crutches/cane/walker 0 No Furniture 0 No Intravenous therapy Access/Saline/Heparin Lock 0 No Weak (short steps with or without shuffle, stooped but able to lift head 0 No while walking, may seek support from furniture) Impaired (short steps with shuffle, may have difficulty arising from chair, 0 No head down, impaired balance) Mental Status Oriented  to own ability 0 No Overestimates or forgets limitations 0 No Risk Level: Low Risk Score: 0 Electronic Signature(s) Signed: 07/25/2019 3:01:15 PM By: Yevonne Pax RN Entered By: Yevonne Pax on 06/12/2019 09:25:46 -------------------------------------------------------------------------------- Nutrition Risk Screening Details Patient Name: Date of Service: [name], [name] 06/12/2019 9:00 AM Medical Record [id number] Patient Account Number: [id number] Date of Birth/Sex: Treating RN: 1946/06/15 (73 y.o. [name]) Yevonne Pax Primary Care Faduma Cho: SYSTEM, PCP Other Clinician: Referring Hayzen Lorenson: Treating Tammy Ericsson/Extender:Robson, Lamar Sprinkles in Treatment: 0 Height (in): 70 Weight (lbs): 152 Body Mass Index (BMI): 21.8 Nutrition Risk Screening Items Score Screening NUTRITION RISK SCREEN: I have an illness or condition that made me change the kind and/or 2 Yes amount of food I eat I eat fewer than two meals per day 3 Yes I eat few fruits and vegetables, or milk products 2 Yes I have three or more drinks of beer, liquor or wine almost every day 0 No I have tooth or mouth problems that make it hard for me to eat 2 Yes I don't always have enough money to buy the food I need 0 No I eat alone most of the time 0  No I take three or more different prescribed or over-the-counter drugs a day 1 Yes 2 Yes Without wanting to, I have lost or gained 10 pounds in the last six months I am not always physically able to shop, cook and/or feed myself 0 No Nutrition Protocols Good Risk Protocol Moderate Risk Protocol Provide education on High Risk Proctocol 0 nutrition Risk Level: High Risk Score: 12 Electronic Signature(s) Signed: 07/25/2019 3:01:15 PM By: Yevonne Pax RN Entered By: Yevonne Pax on 06/12/2019 09:26:17

## 2019-07-27 ENCOUNTER — Encounter (HOSPITAL_BASED_OUTPATIENT_CLINIC_OR_DEPARTMENT_OTHER): Payer: Medicare HMO | Admitting: Internal Medicine

## 2019-07-27 ENCOUNTER — Other Ambulatory Visit: Payer: Self-pay

## 2019-07-27 DIAGNOSIS — M272 Inflammatory conditions of jaws: Secondary | ICD-10-CM | POA: Diagnosis not present

## 2019-07-27 DIAGNOSIS — C760 Malignant neoplasm of head, face and neck: Secondary | ICD-10-CM | POA: Diagnosis not present

## 2019-07-27 DIAGNOSIS — L598 Other specified disorders of the skin and subcutaneous tissue related to radiation: Secondary | ICD-10-CM | POA: Diagnosis not present

## 2019-07-27 DIAGNOSIS — M8788 Other osteonecrosis, other site: Secondary | ICD-10-CM | POA: Diagnosis not present

## 2019-07-27 NOTE — Progress Notes (Signed)
[name], [name] ([id number]) Visit Report for 07/27/2019 Arrival Information Details Patient Name: Date of Service: [name], [name] 07/27/2019 1:00 PM Medical Record [id number] Patient Account Number: [id number] Date of Birth/Sex: Treating RN: 1945-09-14 (73 y.o. M) Primary Care Kathi Dohn: SYSTEM, PCP Other Clinician: [name] Referring Petra Sargeant: Treating Iyad Deroo/Extender:Robson, [name] in Treatment: 6 Visit Information History Since Last Visit Added or deleted any medications: No Patient Arrived: Ambulatory Any new allergies or adverse reactions: No Arrival Time: 12:55 Had a fall or experienced change in No Accompanied By: self activities of daily living that may affect Transfer Assistance: None risk of falls: Patient Identification Verified: Yes Signs or symptoms of abuse/neglect since last No Secondary Verification Process Yes visito Completed: Hospitalized since last visit: No Patient Requires Transmission-Based No Implantable device outside of the clinic excluding No Precautions: cellular tissue based products placed in the center Patient Has Alerts: No since last visit: Pain Present Now: No Electronic Signature(s) Signed: 07/27/2019 3:39:48 PM By: [name] EMT/HBOT Entered By: [name] on 07/27/2019 13:15:14 -------------------------------------------------------------------------------- Encounter Discharge Information Details Patient Name: Date of Service: [name]. 07/27/2019 1:00 PM Medical Record [id number] Patient Account Number: [id number] Date of Birth/Sex: Treating RN: 04-29-1946 (73 y.o. M) Primary Care Cherron Blitzer: SYSTEM, PCP Other Clinician: [name] Referring Isham Smitherman: Treating Emonni Depasquale/Extender:Robson, [name] in Treatment: 6 Encounter Discharge Information Items Discharge Condition: Stable Ambulatory Status: Ambulatory Discharge Destination: Home Transportation: Private Auto Accompanied By:  self Schedule Follow-up Appointment: Yes Clinical Summary of Care: Patient Declined Electronic Signature(s) Signed: 07/27/2019 3:39:48 PM By: [name] EMT/HBOT Entered By: [name] on 07/27/2019 15:39:17 -------------------------------------------------------------------------------- Patient/Caregiver Education Details Patient Name: [name] 12/10/2020andnbsp1:00 Date of Service: PM Medical Record [id number] Number: Patient Account Number: [id number] Treating RN: Jan 03, 1946 (73 y.o. Date of Birth/Gender: M) Other Clinician: [name] Primary Care Physician: SYSTEM, PCP Treating [name] Referring Physician: Physician/Extender: [name] in Treatment: 6 Education Assessment Education Provided To: Patient Education Topics Provided Hyperbaric Oxygenation: Methods: Explain/Verbal Responses: State content correctly Electronic Signature(s) Signed: 07/27/2019 3:39:48 PM By: [name] EMT/HBOT Entered By: [name] on 07/27/2019 15:39:05 -------------------------------------------------------------------------------- Vitals Details Patient Name: Date of Service: [name]. 07/27/2019 1:00 PM Medical Record [id number] Patient Account Number: [id number] Date of Birth/Sex: Treating RN: September 24, 1945 (73 y.o. M) Primary Care Shyteria [name]: SYSTEM, PCP Other Clinician: [name] Referring Tyliah Schlereth: Treating Jaysion Ramseyer/Extender:Robson, [name] in Treatment: 6 Vital Signs Time Taken: 13:00 Temperature (F): 97.8 Height (in): 70 Pulse (bpm): 66 Weight (lbs): 152 Respiratory Rate (breaths/min): 15 Body Mass Index (BMI): 21.8 Blood Pressure (mmHg): 128/72 Reference Range: 80 - 120 mg / dl Electronic Signature(s) Signed: 07/27/2019 3:39:48 PM By: [name] EMT/HBOT Entered By: [name] on 07/27/2019 13:15:35

## 2019-07-27 NOTE — Progress Notes (Signed)
RAKEEN, GINGER ([id number]) Visit Report for 07/27/2019 SuperBill Details Patient Name: Date of Service: [name], [name] 07/27/2019 Medical Record [id number] Patient Account Number: [id number] Date of Birth/Sex: Treating RN: April 03, 1946 (73 y.o. 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

## 2019-07-27 NOTE — Progress Notes (Addendum)
[name], [name] ([id number]) Visit Report for 07/27/2019 HBO Details Patient Name: Date of Service: [name], [name] 07/27/2019 1:00 PM Medical Record [id number] Patient Account Number: [id number] Date of Birth/Sex: Treating RN: Oct 26, 1945 (73 y.o. 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 HBO Treatment Details Treatment Number: 20 Patient Type: Outpatient Chamber Type: Monoplace Chamber Serial #: [id number] Treatment Protocol: 2.5 ATA with 90 minutes oxygen, with two 5 minute air breaks Treatment Details Compression Rate Down: 2.0 psi / minute De-Compression Rate Up: 2.0 psi / minute Air breaks and CompressTx Pressure breathing periods DecompressDecompress Begins Reached (leave unused spaces Begins Ends blank) Chamber Pressure (ATA)1 2.5 2.5 2.5 2.5 2.5 --2.5 1 Clock Time (24 hr) 13:05 13:17 13:4713:5214:2214:27--14:57 15:09 Treatment Length: 124 (minutes) Treatment Segments: 4 Vital Signs Capillary Blood Glucose Reference Range: 80 - 120 mg / dl HBO Diabetic Blood Glucose Intervention Range: <131 mg/dl or >629 mg/dl Time Vitals Blood Respiratory Capillary Blood Glucose Pulse Action Type: Pulse: Temperature: Taken: Pressure: Rate: Glucose (mg/dl): Meter #: Oximetry (%) Taken: Pre 13:00 128/72 66 15 97.8 Post 15:15 126/64 50 15 98.2 Treatment Response Treatment Toleration: Well Treatment Completion Treatment Completed without Adverse Event Status: Ayala Ribble Notes No concerns with treatment given Physician HBO Attestation: I certify that I supervised this HBO treatment in accordance with Medicare guidelines. A trained Yes Yes emergency  response team is readily available per hospital policies and procedures. Continue HBOT as ordered. Yes Electronic Signature(s) Signed: 07/27/2019 4:44:35 PM By: Baltazar Najjar MD Previous Signature: 07/27/2019 3:39:48 PM Version By: Benjaman Kindler EMT/HBOT Entered By: Baltazar Najjar on 07/27/2019 16:16:16 -------------------------------------------------------------------------------- HBO Safety Checklist Details Patient Name: Date of Service: [name]. 07/27/2019 1:00 PM Medical Record [id number] Patient Account Number: [id number] Date of Birth/Sex: Treating RN: 1946-06-19 (73 y.o. M) Primary Care Alaira Level: SYSTEM, PCP Other Clinician: Benjaman Kindler Referring Rosario Duey: Treating Jolan Upchurch/Extender:Robson, Lamar Sprinkles in Treatment: 6 HBO Safety Checklist Items Safety Checklist Consent Form Signed Patient voided / foley secured and emptied When did you last eato n/a Last dose of injectable or oral agent n/a NA Ostomy pouch emptied and vented if applicable NA All implantable devices assessed, documented and approved NA Intravenous access site secured and place Valuables secured Linens and cotton and cotton/polyester blend (less than 51% polyester) Personal oil-based products / skin lotions / body lotions removed NA Wigs or hairpieces removed Smoking or tobacco materials removed Books / newspapers / magazines / loose paper removed Cologne, aftershave, perfume and deodorant removed Jewelry removed (may wrap wedding band) NA Make-up removed Hair care products removed NA Battery operated devices (external) removed NA Heating patches and chemical warmers removed NA Titanium eyewear removed NA Nail polish cured greater than 10 hours NA Casting material cured greater than 10 hours NA Hearing aids removed NA Loose dentures or partials removed NA Prosthetics have been removed Patient demonstrates correct use of air break device (if applicable) Patient concerns have  been addressed Patient grounding bracelet on and cord attached to chamber Specifics for Inpatients (complete in addition to above) Medication sheet sent with patient Intravenous medications needed or due during therapy sent with patient Drainage tubes (e.g. nasogastric tube or chest tube secured and vented) Endotracheal or Tracheotomy tube secured Cuff deflated of air and inflated with saline Airway suctioned Electronic  Signature(s) Signed: 07/27/2019 1:16:20 PM By: Benjaman Kindler EMT/HBOT Entered By: Benjaman Kindler on 07/27/2019 13:16:20

## 2019-07-28 ENCOUNTER — Encounter (HOSPITAL_BASED_OUTPATIENT_CLINIC_OR_DEPARTMENT_OTHER): Payer: Medicare HMO | Admitting: Internal Medicine

## 2019-07-28 ENCOUNTER — Other Ambulatory Visit: Payer: Self-pay

## 2019-07-28 DIAGNOSIS — M272 Inflammatory conditions of jaws: Secondary | ICD-10-CM | POA: Diagnosis not present

## 2019-07-28 DIAGNOSIS — C760 Malignant neoplasm of head, face and neck: Secondary | ICD-10-CM | POA: Diagnosis not present

## 2019-07-28 DIAGNOSIS — M8788 Other osteonecrosis, other site: Secondary | ICD-10-CM | POA: Diagnosis not present

## 2019-07-28 DIAGNOSIS — M8668 Other chronic osteomyelitis, other site: Secondary | ICD-10-CM | POA: Diagnosis not present

## 2019-07-28 NOTE — Progress Notes (Addendum)
[name], [name] ([id number]) Visit Report for 07/28/2019 HBO Details Patient Name: Date of Service: [name], [name] 07/28/2019 1:00 PM Medical Record [id number] Patient Account Number: [id number] Date of Birth/Sex: Treating RN: 06-May-1946 (73 y.o. 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 HBO Treatment Details Treatment Number: 21 Patient Type: Outpatient Chamber Type: Monoplace Chamber Serial #: [id number] Treatment Protocol: 2.5 ATA with 90 minutes oxygen, with two 5 minute air breaks Treatment Details Compression Rate Down: 2.0 psi / minute De-Compression Rate Up: 2.0 psi / minute Air breaks and CompressTx Pressure breathing periods DecompressDecompress Begins Reached (leave unused spaces Begins Ends blank) Chamber Pressure (ATA)1 2.5 2.5 2.5 2.5 2.5 --2.5 1 Clock Time (24 hr) 13:06 13:18 13:4813:5314:2314:28--14:58 15:10 Treatment Length: 124 (minutes) Treatment Segments: 4 Vital Signs Capillary Blood Glucose Reference Range: 80 - 120 mg / dl HBO Diabetic Blood Glucose Intervention Range: <131 mg/dl or >564 mg/dl Time Vitals Blood Respiratory Capillary Blood Glucose Pulse Action Type: Pulse: Temperature: Taken: Pressure: Rate: Glucose (mg/dl): Meter #: Oximetry (%) Taken: Pre 12:55 125/58 54 13 97.9 Post 15:13 122/59 51 15 98.3 Treatment Response Treatment Toleration: Well Treatment Completion Treatment Completed without Adverse Event Status: Franki Alcaide Notes No concerns with treatment given Physician HBO Attestation: I certify that I supervised this HBO treatment in accordance with Medicare guidelines. A trained Yes Yes emergency  response team is readily available per hospital policies and procedures. Continue HBOT as ordered. Yes Electronic Signature(s) Signed: 07/28/2019 6:34:01 PM By: Baltazar Najjar MD Previous Signature: 07/28/2019 3:59:32 PM Version By: Benjaman Kindler EMT/HBOT Entered By: Baltazar Najjar on 07/28/2019 18:32:54 -------------------------------------------------------------------------------- HBO Safety Checklist Details Patient Name: Date of Service: [name]. 07/28/2019 1:00 PM Medical Record [id number] Patient Account Number: [id number] Date of Birth/Sex: Treating RN: Dec 26, 1945 (73 y.o. M) Primary Care Ita Fritzsche: SYSTEM, PCP Other Clinician: Benjaman Kindler Referring Rael Tilly: Treating Taden Witter/Extender:Robson, Lamar Sprinkles in Treatment: 6 HBO Safety Checklist Items Safety Checklist Consent Form Signed Patient voided / foley secured and emptied When did you last eato n/a Last dose of injectable or oral agent n/a NA Ostomy pouch emptied and vented if applicable NA All implantable devices assessed, documented and approved NA Intravenous access site secured and place Valuables secured Linens and cotton and cotton/polyester blend (less than 51% polyester) Personal oil-based products / skin lotions / body lotions removed NA Wigs or hairpieces removed NA Smoking or tobacco materials removed Books / newspapers / magazines / loose paper removed Cologne, aftershave, perfume and deodorant removed Jewelry removed (may wrap wedding band) NA Make-up removed Hair care products removed NA Battery operated devices (external) removed NA Heating patches and chemical warmers removed NA Titanium eyewear removed NA Nail polish cured greater than 10 hours NA Casting material cured greater than 10 hours NA Hearing aids removed NA Loose dentures or partials removed NA Prosthetics have been removed Patient demonstrates correct use of air break device (if applicable) Patient concerns have  been addressed Patient grounding bracelet on and cord attached to chamber Specifics for Inpatients (complete in addition to above) Medication sheet sent with patient Intravenous medications needed or due during therapy sent with patient Drainage tubes (e.g. nasogastric tube or chest tube secured and vented) Endotracheal or Tracheotomy tube secured Cuff deflated of air and inflated with saline Airway suctioned  Electronic Signature(s) Signed: 07/28/2019 1:20:00 PM By: Benjaman Kindler EMT/HBOT Entered By: Benjaman Kindler on 07/28/2019 13:19:59

## 2019-07-28 NOTE — Progress Notes (Signed)
[name], [name] ([id number]) Visit Report for 07/28/2019 Arrival Information Details Patient Name: Date of Service: [name], [name] 07/28/2019 1:00 PM Medical Record [id number] Patient Account Number: [id number] Date of Birth/Sex: Treating RN: [birth year] (73 y.o. M) Primary Care Xitlalli Newhard: SYSTEM, PCP Other Clinician: [name] Referring Lendon George: Treating England Greb/Extender:Robson, [name] in Treatment: 6 Visit Information History Since Last Visit Added or deleted any medications: No Patient Arrived: Ambulatory Any new allergies or adverse reactions: No Arrival Time: 12:50 Had a fall or experienced change in No Accompanied By: self activities of daily living that may affect Transfer Assistance: None risk of falls: Patient Identification Verified: Yes Signs or symptoms of abuse/neglect since last No Secondary Verification Process Yes visito Completed: Hospitalized since last visit: No Patient Requires Transmission-Based No Implantable device outside of the clinic excluding No Precautions: cellular tissue based products placed in the center Patient Has Alerts: No since last visit: Pain Present Now: No Electronic Signature(s) Signed: 07/28/2019 3:59:32 PM By: [name] EMT/HBOT Entered By: [name] on 07/28/2019 13:18:59 -------------------------------------------------------------------------------- Encounter Discharge Information Details Patient Name: Date of Service: [name]. 07/28/2019 1:00 PM Medical Record [id number] Patient Account Number: [id number] Date of Birth/Sex: Treating RN: 22-Jun-1946 (73 y.o. M) Primary Care Emberly Tomasso: SYSTEM, PCP Other Clinician: [name] Referring Kaled Allende: Treating Adalis Gatti/Extender:Robson, [name] in Treatment: 6 Encounter Discharge Information Items Discharge Condition: Stable Ambulatory Status: Ambulatory Discharge Destination: Home Transportation: Private Auto Accompanied By:  self Schedule Follow-up Appointment: Yes Clinical Summary of Care: Patient Declined Electronic Signature(s) Signed: 07/28/2019 3:59:32 PM By: [name] EMT/HBOT Entered By: [name] on 07/28/2019 15:58:58 -------------------------------------------------------------------------------- Patient/Caregiver Education Details Patient Name: [name] 12/11/2020andnbsp1:00 Date of Service: PM Medical Record [id number] Number: Patient Account Number: [id number] Treating RN: 11-05-1945 (73 y.o. Date of Birth/Gender: M) Other Clinician: [name] Primary Care Physician: SYSTEM, PCP Treating [name] Referring Physician: Physician/Extender: [name] in Treatment: 6 Education Assessment Education Provided To: Patient Education Topics Provided Hyperbaric Oxygenation: Methods: Explain/Verbal Responses: State content correctly Electronic Signature(s) Signed: 07/28/2019 3:59:32 PM By: [name] EMT/HBOT Entered By: [name] on 07/28/2019 15:58:45 -------------------------------------------------------------------------------- Vitals Details Patient Name: Date of Service: [name]. 07/28/2019 1:00 PM Medical Record [id number] Patient Account Number: [id number] Date of Birth/Sex: Treating RN: 1945-09-04 (73 y.o. M) Primary Care Joseth Weigel: SYSTEM, PCP Other Clinician: [name] Referring Floreine Kingdon: Treating Franko Hilliker/Extender:Robson, [name] in Treatment: 6 Vital Signs Time Taken: 12:55 Temperature (F): 97.9 Height (in): 70 Pulse (bpm): 54 Weight (lbs): 152 Respiratory Rate (breaths/min): 13 Body Mass Index (BMI): 21.8 Blood Pressure (mmHg): 125/58 Reference Range: 80 - 120 mg / dl Electronic Signature(s) Signed: 07/28/2019 3:59:32 PM By: [name] EMT/HBOT Entered By: [name] on 07/28/2019 13:19:14

## 2019-07-28 NOTE — Progress Notes (Signed)
ANDREAZ, KALMBACH ([id number]) Visit Report for 07/28/2019 SuperBill Details Patient Name: Date of Service: KLEBER, DIERSEN 07/28/2019 Medical Record [id number] Patient Account Number: [id number] Date of Birth/Sex: Treating RN: 1946-02-19 (73 y.o. 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

## 2019-07-31 ENCOUNTER — Other Ambulatory Visit: Payer: Self-pay

## 2019-07-31 ENCOUNTER — Encounter (HOSPITAL_BASED_OUTPATIENT_CLINIC_OR_DEPARTMENT_OTHER): Payer: Medicare HMO | Admitting: Internal Medicine

## 2019-07-31 DIAGNOSIS — L598 Other specified disorders of the skin and subcutaneous tissue related to radiation: Secondary | ICD-10-CM | POA: Diagnosis not present

## 2019-07-31 DIAGNOSIS — M8788 Other osteonecrosis, other site: Secondary | ICD-10-CM | POA: Diagnosis not present

## 2019-07-31 DIAGNOSIS — M272 Inflammatory conditions of jaws: Secondary | ICD-10-CM | POA: Diagnosis not present

## 2019-07-31 DIAGNOSIS — C760 Malignant neoplasm of head, face and neck: Secondary | ICD-10-CM | POA: Diagnosis not present

## 2019-07-31 NOTE — Progress Notes (Signed)
[name], [name] ([id number]) Visit Report for 07/31/2019 Arrival Information Details Patient Name: Date of Service: [name], [name] 07/31/2019 1:00 PM Medical Record [id number] Patient Account Number: [id number] Date of Birth/Sex: Treating RN: 10/20/1945 (73 y.o. M) Primary Care [name]: SYSTEM, PCP Other Clinician: [name] Referring [name]: Treating [name]/Extender:[name], [name] in Treatment: 7 Visit Information History Since Last Visit Added or deleted any medications: No Patient Arrived: Ambulatory Any new allergies or adverse reactions: No Arrival Time: 13:05 Had a fall or experienced change in No Accompanied By: self activities of daily living that may affect Transfer Assistance: None risk of falls: Patient Identification Verified: Yes Signs or symptoms of abuse/neglect since last No Secondary Verification Process Yes visito Completed: Hospitalized since last visit: No Patient Requires Transmission-Based No Implantable device outside of the clinic excluding No Precautions: cellular tissue based products placed in the center Patient Has Alerts: No since last visit: Pain Present Now: No Electronic Signature(s) Signed: 07/31/2019 3:44:56 PM By: [name] EMT/HBOT Entered By: [name] on 07/31/2019 13:27:07 -------------------------------------------------------------------------------- Encounter Discharge Information Details Patient Name: Date of Service: [name]. 07/31/2019 1:00 PM Medical Record [id number] Patient Account Number: [id number] Date of Birth/Sex: Treating RN: [birth year] (73 y.o. M) Primary Care Fawaz Borquez: SYSTEM, PCP Other Clinician: [name] Referring Eniyah Eastmond: Treating Zariya Minner/Extender:[name], [name] in Treatment: 7 Encounter Discharge Information Items Discharge Condition: Stable Ambulatory Status: Ambulatory Discharge Destination: Home Transportation: Private Auto Accompanied By:  self Schedule Follow-up Appointment: Yes Clinical Summary of Care: Patient Declined Electronic Signature(s) Signed: 07/31/2019 3:44:56 PM By: [name] EMT/HBOT Entered By: [name] on 07/31/2019 15:44:29 -------------------------------------------------------------------------------- Patient/Caregiver Education Details Patient Name: [name] 12/14/2020andnbsp1:00 Date of Service: PM Medical Record [id number] Number: Patient Account Number: [id number] Treating RN: 1945-11-21 (73 y.o. Date of Birth/Gender: M) Other Clinician: [name] Primary Care Physician: SYSTEM, PCP Treating [name] Referring Physician: Physician/Extender: [name] in Treatment: 7 Education Assessment Education Provided To: Patient Education Topics Provided Hyperbaric Oxygenation: Methods: Explain/Verbal Responses: State content correctly Electronic Signature(s) Signed: 07/31/2019 3:44:56 PM By: [name] EMT/HBOT Entered By: [name] on 07/31/2019 15:44:14 -------------------------------------------------------------------------------- Vitals Details Patient Name: Date of Service: [name]. 07/31/2019 1:00 PM Medical Record [id number] Patient Account Number: [id number] Date of Birth/Sex: Treating RN: 13-Jan-1946 (73 y.o. M) Primary Care Amorah Sebring: SYSTEM, PCP Other Clinician: Referring Blessin Kanno: Treating Mildred Bollard/Extender:[name], [name] in Treatment: 7 Vital Signs Time Taken: 13:10 Temperature (F): 97.8 Height (in): 70 Pulse (bpm): 54 Weight (lbs): 152 Respiratory Rate (breaths/min): 16 Body Mass Index (BMI): 21.8 Blood Pressure (mmHg): 119/72 Reference Range: 80 - 120 mg / dl Electronic Signature(s) Signed: 07/31/2019 3:44:56 PM By: [name] EMT/HBOT Entered By: [name] on 07/31/2019 13:27:30

## 2019-07-31 NOTE — Progress Notes (Signed)
CONELL, NARDOLILLO ([id number]) Visit Report for 07/31/2019 SuperBill Details Patient Name: Date of Service: [name], [name] 07/31/2019 Medical Record [id number] Patient Account Number: [id number] Date of Birth/Sex: Treating RN: [birth year] (73 y.o. 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

## 2019-07-31 NOTE — Progress Notes (Addendum)
[name] ([id number]) Visit Report for 07/31/2019 HBO Details Patient Name: Date of Service: [name] 07/31/2019 1:00 PM Medical Record [id number] Patient Account Number: [id number] Date of Birth/Sex: Treating RN: Mar 10, 1946 (73 y.o. 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 HBO Treatment Details Treatment Number: 22 Patient Type: Outpatient Chamber Type: Monoplace Chamber Serial #: [id number] Treatment Protocol: 2.5 ATA with 90 minutes oxygen, with two 5 minute air breaks Treatment Details Compression Rate Down: 2.0 psi / minute De-Compression Rate Up: 2.0 psi / minute Air breaks and CompressTx Pressure breathing periods DecompressDecompress Begins Reached (leave unused spaces Begins Ends blank) Chamber Pressure (ATA)1 2.5 2.5 2.5 2.5 2.5 --2.5 1 Clock Time (24 hr) 13:15 13:27 13:5714:0214:3214:37--15:07 15:19 Treatment Length: 124 (minutes) Treatment Segments: 4 Vital Signs Capillary Blood Glucose Reference Range: 80 - 120 mg / dl HBO Diabetic Blood Glucose Intervention Range: <131 mg/dl or >213 mg/dl Time Vitals Blood Respiratory Capillary Blood Glucose Pulse Action Type: Pulse: Temperature: Taken: Pressure: Rate: Glucose (mg/dl): Meter #: Oximetry (%) Taken: Pre 13:10 119/72 54 16 97.8 Post 15:21 136/63 50 14 98.2 Treatment Response Treatment Toleration: Well Treatment Completion Treatment Completed without Adverse Event Status: Illona [name] Notes No concerns with treatment given Physician HBO Attestation: I certify that I supervised this HBO treatment in accordance with Medicare guidelines. A trained Yes Yes emergency  response team is readily available per hospital policies and procedures. Continue HBOT as ordered. Yes Electronic Signature(s) Signed: 07/31/2019 5:56:50 PM By: [name] Najjar MD Previous Signature: 07/31/2019 3:44:56 PM Version By: [name] EMT/HBOT Entered By: [name] on 07/31/2019 17:55:47 -------------------------------------------------------------------------------- HBO Safety Checklist Details Patient Name: Date of Service: [name] [facility]. 07/31/2019 1:00 PM Medical Record [id number] Patient Account Number: [id number] Date of Birth/Sex: Treating RN: July 15, 1946 (73 y.o. M) Primary Care Mayzee Reichenbach: [name], PCP Other Clinician: Benjaman [name] Referring Coady Train: Treating [name]/Extender:[name] in Treatment: 7 HBO Safety Checklist Items Safety Checklist Consent Form Signed Patient voided / foley secured and emptied When did you last eato n/a Last dose of injectable or oral agent n/a NA Ostomy pouch emptied and vented if applicable NA All implantable devices assessed, documented and approved NA Intravenous access site secured and place Valuables secured Linens and cotton and cotton/polyester blend (less than 51% polyester) Personal oil-based products / skin lotions / body lotions removed NA Wigs or hairpieces removed NA Smoking or tobacco materials removed Books / newspapers / magazines / loose paper removed Cologne, aftershave, perfume and deodorant removed Jewelry removed (may wrap wedding band) NA Make-up removed Hair care products removed NA Battery operated devices (external) removed NA Heating patches and chemical warmers removed NA Titanium eyewear removed NA Nail polish cured greater than 10 hours NA Casting material cured greater than 10 hours NA Hearing aids removed NA Loose dentures or partials removed NA Prosthetics have been removed Patient demonstrates correct use of air break device (if applicable) Patient concerns have  been addressed Patient grounding bracelet on and cord attached to chamber Specifics for Inpatients (complete in addition to above) Medication sheet sent with patient Intravenous medications needed or due during therapy sent with patient Drainage tubes (e.g. nasogastric tube or chest tube secured and vented) Endotracheal or Tracheotomy tube secured Cuff deflated of air and inflated with saline Airway suctioned  Electronic Signature(s) Signed: 07/31/2019 1:28:47 PM By: [name] EMT/HBOT Entered By: [name] on 07/31/2019 13:28:46

## 2019-08-01 ENCOUNTER — Encounter (HOSPITAL_BASED_OUTPATIENT_CLINIC_OR_DEPARTMENT_OTHER): Payer: Medicare HMO | Admitting: Internal Medicine

## 2019-08-01 DIAGNOSIS — C760 Malignant neoplasm of head, face and neck: Secondary | ICD-10-CM | POA: Diagnosis not present

## 2019-08-01 DIAGNOSIS — M8788 Other osteonecrosis, other site: Secondary | ICD-10-CM | POA: Diagnosis not present

## 2019-08-01 DIAGNOSIS — L598 Other specified disorders of the skin and subcutaneous tissue related to radiation: Secondary | ICD-10-CM | POA: Diagnosis not present

## 2019-08-01 DIAGNOSIS — M272 Inflammatory conditions of jaws: Secondary | ICD-10-CM | POA: Diagnosis not present

## 2019-08-01 NOTE — Progress Notes (Signed)
[name], [name] ([id number]) Visit Report for 08/01/2019 Arrival Information Details Patient Name: Date of Service: [name], [facility] 08/01/2019 1:00 PM Medical Record [id number] Patient Account Number: [id number] Date of Birth/Sex: Treating RN: 07-24-1946 (73 y.o. M) Primary Care [name]: SYSTEM, PCP Other Clinician: [name] Referring [name]: Treating [name]/Extender:Robson, [name] in Treatment: 7 Visit Information History Since Last Visit Added or deleted any medications: No Patient Arrived: Ambulatory Any new allergies or adverse reactions: No Arrival Time: 12:50 Had a fall or experienced change in No Accompanied By: self activities of daily living that may affect Transfer Assistance: None risk of falls: Patient Identification Verified: Yes Signs or symptoms of abuse/neglect since last No Secondary Verification Process Yes visito Completed: Hospitalized since last visit: No Patient Requires Transmission-Based No Implantable device outside of the clinic excluding No Precautions: cellular tissue based products placed in the center Patient Has Alerts: No since last visit: Pain Present Now: No Electronic Signature(s) Signed: 08/01/2019 3:57:04 PM By: [name] EMT/HBOT Entered By: [name] on 08/01/2019 13:53:07 -------------------------------------------------------------------------------- Encounter Discharge Information Details Patient Name: Date of Service: [name]. 08/01/2019 1:00 PM Medical Record [id number] Patient Account Number: [id number] Date of Birth/Sex: Treating RN: Mar 01, 1946 (73 y.o. M) Primary Care [name]: SYSTEM, PCP Other Clinician: [name] Referring [name]: Treating [name]/Extender:Robson, [name] in Treatment: 7 Encounter Discharge Information Items Discharge Condition: Stable Ambulatory Status: Ambulatory Discharge Destination: Home Transportation: Private Auto Accompanied By:  self Schedule Follow-up Appointment: Yes Clinical Summary of Care: Patient Declined Electronic Signature(s) Signed: 08/01/2019 3:57:04 PM By: [name] EMT/HBOT Entered By: [name] on 08/01/2019 15:56:37 -------------------------------------------------------------------------------- Patient/Caregiver Education Details Patient Name: [name] 12/15/2020andnbsp1:00 Date of Service: PM Medical Record [id number] Number: Patient Account Number: [id number] Treating RN: 05/21/1946 (73 y.o. Date of Birth/Gender: M) Other Clinician: [name] Primary Care Physician: SYSTEM, PCP Treating [name] Referring Physician: Physician/Extender: [name] in Treatment: 7 Education Assessment Education Provided To: Patient Education Topics Provided Hyperbaric Oxygenation: Methods: Explain/Verbal Responses: State content correctly Electronic Signature(s) Signed: 08/01/2019 3:57:04 PM By: [name] EMT/HBOT Entered By: [name] on 08/01/2019 15:56:25 -------------------------------------------------------------------------------- Vitals Details Patient Name: Date of Service: [name]. 08/01/2019 1:00 PM Medical Record [id number] Patient Account Number: [id number] Date of Birth/Sex: Treating RN: 1946/03/21 (73 y.o. M) Primary Care [name]: SYSTEM, PCP Other Clinician: [name] Referring [name]: Treating [name]/Extender:Robson, [name] in Treatment: 7 Vital Signs Time Taken: 12:55 Temperature (F): 97.3 Height (in): 70 Pulse (bpm): 55 Weight (lbs): 152 Respiratory Rate (breaths/min): 16 Body Mass Index (BMI): 21.8 Blood Pressure (mmHg): 144/52 Reference Range: 80 - 120 mg / dl Electronic Signature(s) Signed: 08/01/2019 3:57:04 PM By: [name] EMT/HBOT Entered By: [name] on 08/01/2019 13:53:23

## 2019-08-01 NOTE — Progress Notes (Signed)
DARRYL, ARNHOLD ([id number]) Visit Report for 08/01/2019 SuperBill Details Patient Name: Date of Service: KEATH, HEGARTY 08/01/2019 Medical Record [id number] Patient Account Number: [id number] Date of Birth/Sex: Treating RN: 27-Jan-1946 (73 y.o. 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

## 2019-08-01 NOTE — Progress Notes (Addendum)
[name], [name] ([id number]) Visit Report for 08/01/2019 HBO Details Patient Name: Date of Service: [name], [name] 08/01/2019 1:00 PM Medical Record [id number] Patient Account Number: [id number] Date of Birth/Sex: Treating RN: [birth year] (73 y.o. 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 HBO Treatment Details Treatment Number: 23 Patient Type: Outpatient Chamber Type: Monoplace Chamber Serial #: [id number] Treatment Protocol: 2.5 ATA with 90 minutes oxygen, with two 5 minute air breaks Treatment Details Compression Rate Down: 2.0 psi / minute De-Compression Rate Up: 2.0 psi / minute Air breaks and CompressTx Pressure breathing periods DecompressDecompress Begins Reached (leave unused spaces Begins Ends blank) Chamber Pressure (ATA)1 2.5 2.5 2.5 2.5 2.5 --2.5 1 Clock Time (24 hr) 13:05 13:17 13:4713:5214:2214:27--14:57 15:09 Treatment Length: 124 (minutes) Treatment Segments: 4 Vital Signs Capillary Blood Glucose Reference Range: 80 - 120 mg / dl HBO Diabetic Blood Glucose Intervention Range: <131 mg/dl or >846 mg/dl Time Vitals Blood Respiratory Capillary Blood Glucose Pulse Action Type: Pulse: Temperature: Taken: Pressure: Rate: Glucose (mg/dl): Meter #: Oximetry (%) Taken: Pre 12:55 144/52 55 16 97.3 Post 15:11 141/60 52 14 98 Treatment Response Treatment Toleration: Well Treatment Completion Treatment Completed without Adverse Event Status: [name] Notes No concerns with treatment given Physician HBO Attestation: I certify that I supervised this HBO treatment in accordance with Medicare guidelines. A trained Yes Yes emergency  response team is readily available per hospital policies and procedures. Continue HBOT as ordered. Yes Electronic Signature(s) Signed: 08/01/2019 5:53:46 PM By: Baltazar Najjar MD Previous Signature: 08/01/2019 3:57:04 PM Version By: Benjaman Kindler EMT/HBOT Entered By: Baltazar Najjar on 08/01/2019 17:36:28 -------------------------------------------------------------------------------- HBO Safety Checklist Details Patient Name: Date of Service: [name]. 08/01/2019 1:00 PM Medical Record [id number] Patient Account Number: [id number] Date of Birth/Sex: Treating RN: [birth year] (74 y.o. M) Primary Care Saamiya Jeppsen: SYSTEM, PCP Other Clinician: Benjaman Kindler Referring Rodrickus Min: Treating Jos Cygan/Extender:Robson, Lamar Sprinkles in Treatment: 7 HBO Safety Checklist Items Safety Checklist Consent Form Signed Patient voided / foley secured and emptied When did you last eato n/a Last dose of injectable or oral agent n/a NA Ostomy pouch emptied and vented if applicable NA All implantable devices assessed, documented and approved NA Intravenous access site secured and place Valuables secured Linens and cotton and cotton/polyester blend (less than 51% polyester) Personal oil-based products / skin lotions / body lotions removed NA Wigs or hairpieces removed NA Smoking or tobacco materials removed Books / newspapers / magazines / loose paper removed Cologne, aftershave, perfume and deodorant removed Jewelry removed (may wrap wedding band) NA Make-up removed Hair care products removed NA Battery operated devices (external) removed NA Heating patches and chemical warmers removed NA Titanium eyewear removed NA Nail polish cured greater than 10 hours NA Casting material cured greater than 10 hours NA Hearing aids removed NA Loose dentures or partials removed NA Prosthetics have been removed Patient demonstrates correct use of air break device (if applicable) Patient concerns have  been addressed Patient grounding bracelet on and cord attached to chamber Specifics for Inpatients (complete in addition to above) Medication sheet sent with patient Intravenous medications needed or due during therapy sent with patient Drainage tubes (e.g. nasogastric tube or chest tube secured and vented) Endotracheal or Tracheotomy tube secured Cuff deflated of air and inflated with saline Airway suctioned  Electronic Signature(s) Signed: 08/01/2019 1:54:05 PM By: Benjaman Kindler EMT/HBOT Entered By: Benjaman Kindler on 08/01/2019 13:54:05

## 2019-08-02 ENCOUNTER — Encounter (HOSPITAL_BASED_OUTPATIENT_CLINIC_OR_DEPARTMENT_OTHER): Payer: Medicare HMO | Admitting: Physician Assistant

## 2019-08-02 ENCOUNTER — Other Ambulatory Visit: Payer: Self-pay

## 2019-08-02 DIAGNOSIS — M272 Inflammatory conditions of jaws: Secondary | ICD-10-CM | POA: Diagnosis not present

## 2019-08-02 DIAGNOSIS — M8788 Other osteonecrosis, other site: Secondary | ICD-10-CM | POA: Diagnosis not present

## 2019-08-02 DIAGNOSIS — C760 Malignant neoplasm of head, face and neck: Secondary | ICD-10-CM | POA: Diagnosis not present

## 2019-08-02 DIAGNOSIS — L598 Other specified disorders of the skin and subcutaneous tissue related to radiation: Secondary | ICD-10-CM | POA: Diagnosis not present

## 2019-08-02 NOTE — Progress Notes (Signed)
MADDOXX, PORTNOY ([id number]) Visit Report for 08/02/2019 Problem List Details Patient Name: Date of Service: [name], [name] 08/02/2019 1:00 PM Medical Record [id number] Patient Account Number: [id number] Date of Birth/Sex: Treating RN: [birth year] (73 y.o. 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

## 2019-08-02 NOTE — Progress Notes (Signed)
ANTARES, LOY ([id number]) Visit Report for 08/02/2019 Arrival Information Details Patient Name: Date of Service: [name], [name] 08/02/2019 1:00 PM Medical Record [id number] Patient Account Number: [id number] Date of Birth/Sex: Treating RN: [birth year] (73 y.o. M) Primary Care Jackelyn Illingworth: SYSTEM, PCP Other Clinician: [name] Referring Ritchie Klee: Treating Rhilyn Battle/Extender:[name], [name] in Treatment: 7 Visit Information History Since Last Visit Added or deleted any medications: No Patient Arrived: Ambulatory Any new allergies or adverse reactions: No Arrival Time: 13:00 Had a fall or experienced change in No Accompanied By: self activities of daily living that may affect Transfer Assistance: None risk of falls: Patient Identification Verified: Yes Signs or symptoms of abuse/neglect since last No Secondary Verification Process Yes visito Completed: Hospitalized since last visit: No Patient Requires Transmission-Based No Implantable device outside of the clinic excluding No Precautions: cellular tissue based products placed in the center Patient Has Alerts: No since last visit: Pain Present Now: No Electronic Signature(s) Signed: 08/02/2019 3:47:19 PM By: [name] EMT/HBOT Entered By: [name] on 08/02/2019 14:00:12 -------------------------------------------------------------------------------- Encounter Discharge Information Details Patient Name: Date of Service: [name]. 08/02/2019 1:00 PM Medical Record [id number] Patient Account Number: [id number] Date of Birth/Sex: Treating RN: [birth year] (73 y.o. M) Primary Care Caedan Sumler: SYSTEM, PCP Other Clinician: [name] Referring Sherhonda Gaspar: Treating Jc Veron/Extender:[name], [name] in Treatment: 7 Encounter Discharge Information Items Discharge Condition: Stable Ambulatory Status: Ambulatory Discharge Destination: Home Transportation: Private Auto Accompanied By:  self Schedule Follow-up Appointment: Yes Clinical Summary of Care: Patient Declined Electronic Signature(s) Signed: 08/02/2019 3:47:19 PM By: [name] EMT/HBOT Entered By: [name] on 08/02/2019 15:46:38 -------------------------------------------------------------------------------- Patient/Caregiver Education Details Patient Name: [name] 12/16/2020andnbsp1:00 Date of Service: PM Medical Record [id number] Number: Patient Account Number: [id number] Treating RN: Nov 19, 1945 (73 y.o. Date of Birth/Gender: M) Other Clinician: [name] Primary Care Physician: SYSTEM, PCP Treating [name] Referring Physician: Physician/Extender: [name] in Treatment: 7 Education Assessment Education Provided To: Patient Education Topics Provided Hyperbaric Oxygenation: Methods: Explain/Verbal Responses: State content correctly Electronic Signature(s) Signed: 08/02/2019 3:47:19 PM By: [name] EMT/HBOT Entered By: [name] on 08/02/2019 15:46:25 -------------------------------------------------------------------------------- Vitals Details Patient Name: Date of Service: [name]. 08/02/2019 1:00 PM Medical Record [id number] Patient Account Number: [id number] Date of Birth/Sex: Treating RN: September 29, 1945 (73 y.o. M) Primary Care [name] Messimer: SYSTEM, PCP Other Clinician: [name] Referring Jaheim Canino: Treating Terren Jandreau/Extender:[name], [name] in Treatment: 7 Vital Signs Time Taken: 13:05 Temperature (F): 97.6 Height (in): 70 Pulse (bpm): 52 Weight (lbs): 152 Respiratory Rate (breaths/min): 13 Body Mass Index (BMI): 21.8 Blood Pressure (mmHg): 135/67 Reference Range: 80 - 120 mg / dl Electronic Signature(s) Signed: 08/02/2019 3:47:19 PM By: [name] EMT/HBOT Entered By: [name] on 08/02/2019 14:00:29

## 2019-08-02 NOTE — Progress Notes (Addendum)
[name], [name] ([id number]) Visit Report for 08/02/2019 HBO Details Patient Name: Date of Service: [name], [name] 08/02/2019 1:00 PM Medical Record [id number] Patient Account Number: [id number] Date of Birth/Sex: Treating RN: 03-29-1946 (74 y.o. 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 HBO Treatment Details Treatment Number: 24 Patient Type: Outpatient Chamber Type: Monoplace Chamber Serial #: [id number] Treatment Protocol: 2.5 ATA with 90 minutes oxygen, with two 5 minute air breaks Treatment Details Compression Rate Down: 2.0 psi / minute De-Compression Rate Up: 2.0 psi / minute Air breaks and CompressTx Pressure breathing periods DecompressDecompress Begins Reached (leave unused spaces Begins Ends blank) Chamber Pressure (ATA)1 2.5 2.5 2.5 2.5 2.5 --2.5 1 Clock Time (24 hr) 13:12 13:24 13:5413:5914:2914:34--15:04 15:16 Treatment Length: 124 (minutes) Treatment Segments: 4 Vital Signs Capillary Blood Glucose Reference Range: 80 - 120 mg / dl HBO Diabetic Blood Glucose Intervention Range: <131 mg/dl or >725 mg/dl Time Vitals Blood Respiratory Capillary Blood Glucose Pulse Action Type: Pulse: Temperature: Taken: Pressure: Rate: Glucose (mg/dl): Meter #: Oximetry (%) Taken: Pre 13:05 135/67 52 13 97.6 Post 15:20 151/63 53 15 97.5 Treatment Response Treatment Toleration: Well Treatment Completion Treatment Completed without Adverse Event Status: Physician HBO Attestation: I certify that I supervised this HBO treatment in accordance with Medicare guidelines. A trained Yes emergency response team is readily available per hospital  policies and procedures. Continue HBOT as ordered. Yes Electronic Signature(s) Signed: 08/02/2019 5:36:53 PM By: Lenda Kelp PA-C Previous Signature: 08/02/2019 3:47:19 PM Version By: Benjaman Kindler EMT/HBOT Entered By: Lenda Kelp on 08/02/2019 17:36:53 -------------------------------------------------------------------------------- HBO Safety Checklist Details Patient Name: Date of Service: [name]. 08/02/2019 1:00 PM Medical Record [id number] Patient Account Number: [id number] Date of Birth/Sex: Treating RN: November 03, 1945 (73 y.o. M) Primary Care Jocsan Mcginley: SYSTEM, PCP Other Clinician: Benjaman Kindler Referring Anushree Dorsi: Treating Maddyx Vallie/Extender:[name], Jake Samples in Treatment: 7 HBO Safety Checklist Items Safety Checklist Consent Form Signed Patient voided / foley secured and emptied When did you last eato n/a Last dose of injectable or oral agent n/a NA Ostomy pouch emptied and vented if applicable NA All implantable devices assessed, documented and approved NA Intravenous access site secured and place Valuables secured Linens and cotton and cotton/polyester blend (less than 51% polyester) Personal oil-based products / skin lotions / body lotions removed NA Wigs or hairpieces removed NA Smoking or tobacco materials removed Books / newspapers / magazines / loose paper removed Cologne, aftershave, perfume and deodorant removed Jewelry removed (may wrap wedding band) NA Make-up removed Hair care products removed NA Battery operated devices (external) removed NA Heating patches and chemical warmers removed NA Titanium eyewear removed NA Nail polish cured greater than 10 hours NA Casting material cured greater than 10 hours NA Hearing aids removed NA Loose dentures or partials removed NA Prosthetics have been removed Patient demonstrates correct use of air break device (if applicable) Patient concerns have been addressed Patient grounding bracelet on  and cord attached to chamber Specifics for Inpatients (complete in addition to above) Medication sheet sent with patient Intravenous medications needed or due during therapy sent with patient Drainage tubes (e.g. nasogastric tube or chest tube secured and vented) Endotracheal or Tracheotomy tube secured Cuff deflated of air and inflated with saline Airway suctioned Electronic Signature(s) Signed: 08/02/2019  2:01:11 PM By: Benjaman Kindler EMT/HBOT Entered By: Benjaman Kindler on 08/02/2019 14:01:10

## 2019-08-03 ENCOUNTER — Encounter (HOSPITAL_BASED_OUTPATIENT_CLINIC_OR_DEPARTMENT_OTHER): Payer: Medicare HMO | Admitting: Internal Medicine

## 2019-08-03 DIAGNOSIS — M272 Inflammatory conditions of jaws: Secondary | ICD-10-CM | POA: Diagnosis not present

## 2019-08-03 DIAGNOSIS — M8788 Other osteonecrosis, other site: Secondary | ICD-10-CM | POA: Diagnosis not present

## 2019-08-03 DIAGNOSIS — L598 Other specified disorders of the skin and subcutaneous tissue related to radiation: Secondary | ICD-10-CM | POA: Diagnosis not present

## 2019-08-03 DIAGNOSIS — C760 Malignant neoplasm of head, face and neck: Secondary | ICD-10-CM | POA: Diagnosis not present

## 2019-08-03 NOTE — Progress Notes (Signed)
CLEAVEN, REUTHER ([id number]) Visit Report for 08/03/2019 Arrival Information Details Patient Name: Date of Service: [name], [name] 08/03/2019 1:00 PM Medical Record [id number] Patient Account Number: [id number] Date of Birth/Sex: Treating RN: 01/06/1946 (73 y.o. M) Primary Care Camila Maita: SYSTEM, PCP Other Clinician: [name] Referring Houa Ackert: Treating Kristjan Derner/Extender:Robson, [name] in Treatment: 7 Visit Information History Since Last Visit Added or deleted any medications: No Patient Arrived: Ambulatory Any new allergies or adverse reactions: No Arrival Time: 12:50 Had a fall or experienced change in No Accompanied By: self activities of daily living that may affect Transfer Assistance: None risk of falls: Patient Identification Verified: Yes Signs or symptoms of abuse/neglect since last No Secondary Verification Process Yes visito Completed: Hospitalized since last visit: No Patient Requires Transmission-Based No Implantable device outside of the clinic excluding No Precautions: cellular tissue based products placed in the center Patient Has Alerts: No since last visit: Pain Present Now: No Electronic Signature(s) Signed: 08/03/2019 3:39:50 PM By: [name] EMT/HBOT Entered By: [name] on 08/03/2019 13:32:19 -------------------------------------------------------------------------------- Encounter Discharge Information Details Patient Name: Date of Service: [name]. 08/03/2019 1:00 PM Medical Record [id number] Patient Account Number: [id number] Date of Birth/Sex: Treating RN: Oct 05, 1945 (73 y.o. M) Primary Care Hanalei Glace: SYSTEM, PCP Other Clinician: [name] Referring Nisaiah Bechtol: Treating Delmy Holdren/Extender:Robson, [name] in Treatment: 7 Encounter Discharge Information Items Discharge Condition: Stable Ambulatory Status: Ambulatory Discharge Destination: Home Transportation: Private Auto Accompanied By:  self Schedule Follow-up Appointment: Yes Clinical Summary of Care: Patient Declined Electronic Signature(s) Signed: 08/03/2019 3:39:50 PM By: [name] EMT/HBOT Entered By: [name] on 08/03/2019 15:39:21 -------------------------------------------------------------------------------- Patient/Caregiver Education Details Patient Name: [name] 12/17/2020andnbsp1:00 Date of Service: PM Medical Record [id number] Number: Patient Account Number: [id number] Treating RN: [birth year] (73 y.o. Date of Birth/Gender: M) Other Clinician: [name] Primary Care Physician: SYSTEM, PCP Treating [name] Referring Physician: Physician/Extender: [name] in Treatment: 7 Education Assessment Education Provided To: Patient Education Topics Provided Hyperbaric Oxygenation: Methods: Explain/Verbal Responses: State content correctly Electronic Signature(s) Signed: 08/03/2019 3:39:50 PM By: [name] EMT/HBOT Entered By: [name] on 08/03/2019 15:39:10 -------------------------------------------------------------------------------- Vitals Details Patient Name: Date of Service: [name]. 08/03/2019 1:00 PM Medical Record [id number] Patient Account Number: [id number] Date of Birth/Sex: Treating RN: March 09, 1946 (73 y.o. M) Primary Care Allesha Aronoff: SYSTEM, PCP Other Clinician: [name] Referring Trig Mcbryar: Treating Caydn Justen/Extender:Robson, [name] in Treatment: 7 Vital Signs Time Taken: 12:55 Temperature (F): 97.1 Height (in): 70 Pulse (bpm): 51 Weight (lbs): 152 Respiratory Rate (breaths/min): 17 Body Mass Index (BMI): 21.8 Blood Pressure (mmHg): 129/76 Reference Range: 80 - 120 mg / dl Electronic Signature(s) Signed: 08/03/2019 3:39:50 PM By: [name] EMT/HBOT Entered By: [name] on 08/03/2019 13:32:32

## 2019-08-03 NOTE — Progress Notes (Addendum)
[name] ([id number]) Visit Report for 08/03/2019 HBO Details Patient Name: Date of Service: [name], [name] 08/03/2019 1:00 PM Medical Record [id number] Patient Account Number: [id number] Date of Birth/Sex: Treating RN: June 02, 1946 (73 y.o. 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 HBO Treatment Details Treatment Number: 25 Patient Type: Outpatient Chamber Type: Monoplace Chamber Serial #: [id number] Treatment Protocol: 2.5 ATA with 90 minutes oxygen, with two 5 minute air breaks Treatment Details Compression Rate Down: 2.0 psi / minute De-Compression Rate Up: 2.0 psi / minute Air breaks and CompressTx Pressure breathing periods DecompressDecompress Begins Reached (leave unused spaces Begins Ends blank) Chamber Pressure (ATA)1 2.5 2.5 2.5 2.5 2.5 --2.5 1 Clock Time (24 hr) 13:06 13:18 13:4813:5314:2314:28--14:58 15:10 Treatment Length: 124 (minutes) Treatment Segments: 4 Vital Signs Capillary Blood Glucose Reference Range: 80 - 120 mg / dl HBO Diabetic Blood Glucose Intervention Range: <131 mg/dl or >528 mg/dl Time Vitals Blood Respiratory Capillary Blood Glucose Pulse Action Type: Pulse: Temperature: Taken: Pressure: Rate: Glucose (mg/dl): Meter #: Oximetry (%) Taken: Pre 12:55 129/76 51 17 97.1 Post 15:15 143/62 56 14 97.8 Treatment Response Treatment Toleration: Well Treatment Completion Treatment Completed without Adverse Event Status: Tonnia Bardin Notes No concerns with treatment given Physician HBO Attestation: I certify that I supervised this HBO treatment in accordance with Medicare guidelines. A trained Yes Yes emergency  response team is readily available per hospital policies and procedures. Continue HBOT as ordered. Yes Electronic Signature(s) Signed: 08/03/2019 5:01:00 PM By: Baltazar Najjar MD Previous Signature: 08/03/2019 3:39:50 PM Version By: Benjaman Kindler EMT/HBOT Entered By: Baltazar Najjar on 08/03/2019 16:59:40 -------------------------------------------------------------------------------- HBO Safety Checklist Details Patient Name: Date of Service: [name]. 08/03/2019 1:00 PM Medical Record [id number] Patient Account Number: [id number] Date of Birth/Sex: Treating RN: [birth year] (73 y.o. M) Primary Care Ariely Riddell: SYSTEM, PCP Other Clinician: Benjaman Kindler Referring Hubbert Landrigan: Treating Stancil Deisher/Extender:Robson, Lamar Sprinkles in Treatment: 7 HBO Safety Checklist Items Safety Checklist Consent Form Signed Patient voided / foley secured and emptied When did you last eato n/a Last dose of injectable or oral agent n/a NA Ostomy pouch emptied and vented if applicable NA All implantable devices assessed, documented and approved NA Intravenous access site secured and place Valuables secured Linens and cotton and cotton/polyester blend (less than 51% polyester) Personal oil-based products / skin lotions / body lotions removed NA Wigs or hairpieces removed NA Smoking or tobacco materials removed Books / newspapers / magazines / loose paper removed Cologne, aftershave, perfume and deodorant removed Jewelry removed (may wrap wedding band) NA Make-up removed Hair care products removed NA Battery operated devices (external) removed NA Heating patches and chemical warmers removed NA Titanium eyewear removed NA Nail polish cured greater than 10 hours NA Casting material cured greater than 10 hours NA Hearing aids removed NA Loose dentures or partials removed NA Prosthetics have been removed Patient demonstrates correct use of air break device (if applicable) Patient concerns have  been addressed Patient grounding bracelet on and cord attached to chamber Specifics for Inpatients (complete in addition to above) Medication sheet sent with patient Intravenous medications needed or due during therapy sent with patient Drainage tubes (e.g. nasogastric tube or chest tube secured and vented) Endotracheal or Tracheotomy tube secured Cuff deflated of air and inflated with saline Airway suctioned  Electronic Signature(s) Signed: 08/03/2019 1:33:10 PM By: Benjaman Kindler EMT/HBOT Entered By: Benjaman Kindler on 08/03/2019 13:33:10

## 2019-08-03 NOTE — Progress Notes (Signed)
WILKIN, ELL ([id number]) Visit Report for 08/03/2019 SuperBill Details Patient Name: Date of Service: [name], [name] 08/03/2019 Medical Record [id number] Patient Account Number: [id number] Date of Birth/Sex: Treating RN: [birth year] (73 y.o. 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

## 2019-08-04 ENCOUNTER — Encounter (HOSPITAL_BASED_OUTPATIENT_CLINIC_OR_DEPARTMENT_OTHER): Payer: Medicare HMO | Admitting: Internal Medicine

## 2019-08-04 ENCOUNTER — Other Ambulatory Visit: Payer: Self-pay

## 2019-08-04 DIAGNOSIS — C760 Malignant neoplasm of head, face and neck: Secondary | ICD-10-CM | POA: Diagnosis not present

## 2019-08-04 DIAGNOSIS — M8788 Other osteonecrosis, other site: Secondary | ICD-10-CM | POA: Diagnosis not present

## 2019-08-04 DIAGNOSIS — C44329 Squamous cell carcinoma of skin of other parts of face: Secondary | ICD-10-CM | POA: Diagnosis not present

## 2019-08-04 DIAGNOSIS — R627 Adult failure to thrive: Secondary | ICD-10-CM | POA: Diagnosis not present

## 2019-08-04 DIAGNOSIS — M272 Inflammatory conditions of jaws: Secondary | ICD-10-CM | POA: Diagnosis not present

## 2019-08-04 DIAGNOSIS — R1312 Dysphagia, oropharyngeal phase: Secondary | ICD-10-CM | POA: Diagnosis not present

## 2019-08-04 NOTE — Progress Notes (Signed)
ARLING, MISKIN ([id number]) Visit Report for 08/04/2019 SuperBill Details Patient Name: Date of Service: ANTONINO, SCHOETTLE 08/04/2019 Medical Record [id number] Patient Account Number: [id number] Date of Birth/Sex: Treating RN: [birth year] (73 y.o. 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

## 2019-08-04 NOTE — Progress Notes (Signed)
[name], [name] ([id number]) Visit Report for 08/04/2019 Arrival Information Details Patient Name: Date of Service: [name], [name] 08/04/2019 1:00 PM Medical Record [id number] Patient Account Number: [id number] Date of Birth/Sex: Treating RN: 04/22/1946 (73 y.o. M) Primary Care Hagan Vanauken: SYSTEM, PCP Other Clinician: Mikeal [name] Referring [name]: Treating Shandy Vi/Extender:Robson, [name] in Treatment: 7 Visit Information History Since Last Visit Added or deleted any medications: No Patient Arrived: Ambulatory Any new allergies or adverse reactions: No Arrival Time: 12:55 Had a fall or experienced change in No Accompanied By: self activities of daily living that may affect Transfer Assistance: None risk of falls: Patient Identification Verified: Yes Signs or symptoms of abuse/neglect since last No Secondary Verification Process Yes visito Completed: Hospitalized since last visit: No Patient Requires Transmission-Based No Implantable device outside of the clinic excluding No Precautions: cellular tissue based products placed in the center Patient Has Alerts: No since last visit: Pain Present Now: No Electronic Signature(s) Signed: 08/04/2019 5:07:56 PM By: [name] EMT/HBOT Entered By: [name] on 08/04/2019 13:39:58 -------------------------------------------------------------------------------- Encounter Discharge Information Details Patient Name: Date of Service: [name] Dames. 08/04/2019 1:00 PM Medical Record [id number] Patient Account Number: [id number] Date of Birth/Sex: Treating RN: Dec 23, 1945 (73 y.o. M) Primary Care Kyrie Bun: SYSTEM, PCP Other Clinician: Mikeal [name] Referring Chapel Silverthorn: Treating Maresa Morash/Extender:Robson, [name] in Treatment: 7 Encounter Discharge Information Items Discharge Condition: Stable Ambulatory Status: Ambulatory Discharge Destination: Home Transportation: Private Auto Accompanied By:  self Schedule Follow-up Appointment: Yes Clinical Summary of Care: Patient Declined Electronic Signature(s) Signed: 08/04/2019 5:07:56 PM By: [name] EMT/HBOT Entered By: [name] on 08/04/2019 17:07:30 -------------------------------------------------------------------------------- Patient/Caregiver Education Details Patient Name: [name] Dames 12/18/2020andnbsp1:00 Date of Service: PM Medical Record [id number] Number: Patient Account Number: [id number] Treating RN: 1946/02/14 (73 y.o. Date of Birth/Gender: M) Other Clinician: Mikeal [name] Primary Care Physician: SYSTEM, PCP Treating [name] Referring Physician: Physician/Extender: [name] in Treatment: 7 Education Assessment Education Provided To: Patient Education Topics Provided Hyperbaric Oxygenation: Methods: Explain/Verbal Responses: State content correctly Electronic Signature(s) Signed: 08/04/2019 5:07:56 PM By: [name] EMT/HBOT Entered By: [name] on 08/04/2019 17:07:17 -------------------------------------------------------------------------------- Vitals Details Patient Name: Date of Service: [name] Dames. 08/04/2019 1:00 PM Medical Record [id number] Patient Account Number: [id number] Date of Birth/Sex: Treating RN: 1946-06-02 (73 y.o. M) Primary Care Kayte Borchard: SYSTEM, PCP Other Clinician: Mikeal [name] Referring Abbeygail Igoe: Treating Kesa Birky/Extender:Robson, [name] in Treatment: 7 Vital Signs Time Taken: 13:00 Temperature (F): 98 Height (in): 70 Pulse (bpm): 54 Weight (lbs): 152 Respiratory Rate (breaths/min): 14 Body Mass Index (BMI): 21.8 Blood Pressure (mmHg): 129/58 Reference Range: 80 - 120 mg / dl Electronic Signature(s) Signed: 08/04/2019 5:07:56 PM By: [name] EMT/HBOT Entered By: [name] on 08/04/2019 13:40:13

## 2019-08-04 NOTE — Progress Notes (Addendum)
[name] ([id number]) Visit Report for 08/04/2019 HBO Details Patient Name: Date of Service: [name], [name] 08/04/2019 1:00 PM Medical Record [id number] Patient Account Number: [id number] Date of Birth/Sex: Treating RN: August 10, 1946 (73 y.o. 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 HBO Treatment Details Treatment Number: 26 Patient Type: Outpatient Chamber Type: Monoplace Chamber Serial #: [id number] Treatment Protocol: 2.5 ATA with 90 minutes oxygen, with two 5 minute air breaks Treatment Details Compression Rate Down: 2.0 psi / minute De-Compression Rate Up: 2.0 psi / minute Air breaks and CompressTx Pressure breathing periods DecompressDecompress Begins Reached (leave unused spaces Begins Ends blank) Chamber Pressure (ATA)1 2.5 2.5 2.5 2.5 2.5 --2.5 1 Clock Time (24 hr) 13:06 13:18 13:4813:5314:2314:28--14:58 15:10 Treatment Length: 124 (minutes) Treatment Segments: 4 Vital Signs Capillary Blood Glucose Reference Range: 80 - 120 mg / dl HBO Diabetic Blood Glucose Intervention Range: <131 mg/dl or >332 mg/dl Time Vitals Blood Respiratory Capillary Blood Glucose Pulse Action Type: Pulse: Temperature: Taken: Pressure: Rate: Glucose (mg/dl): Meter #: Oximetry (%) Taken: Pre 13:00 129/58 54 14 98 Post 15:12 148/54 50 16 98.1 Treatment Response Treatment Toleration: Well Treatment Completion Treatment Completed without Adverse Event Status: [name] Notes No concerns with treatment given Physician HBO Attestation: I certify that I supervised this HBO treatment in accordance with Medicare guidelines. A trained Yes Yes emergency  response team is readily available per hospital policies and procedures. Continue HBOT as ordered. Yes Electronic Signature(s) Signed: 08/04/2019 6:01:02 PM By: Baltazar Najjar MD Previous Signature: 08/04/2019 5:07:56 PM Version By: Benjaman Kindler EMT/HBOT Entered By: Baltazar Najjar on 08/04/2019 17:16:42 -------------------------------------------------------------------------------- HBO Safety Checklist Details Patient Name: Date of Service: [name]. 08/04/2019 1:00 PM Medical Record [id number] Patient Account Number: [id number] Date of Birth/Sex: Treating RN: 1945/11/27 (73 y.o. M) Primary Care Eleen Litz: SYSTEM, PCP Other Clinician: Benjaman Kindler Referring Ysabela Keisler: Treating Assia Meanor/Extender:Robson, Lamar Sprinkles in Treatment: 7 HBO Safety Checklist Items Safety Checklist Consent Form Signed Patient voided / foley secured and emptied When did you last eato n/a Last dose of injectable or oral agent n/a NA Ostomy pouch emptied and vented if applicable NA All implantable devices assessed, documented and approved NA Intravenous access site secured and place Valuables secured Linens and cotton and cotton/polyester blend (less than 51% polyester) Personal oil-based products / skin lotions / body lotions removed NA Wigs or hairpieces removed NA Smoking or tobacco materials removed Books / newspapers / magazines / loose paper removed Cologne, aftershave, perfume and deodorant removed Jewelry removed (may wrap wedding band) NA Make-up removed Hair care products removed NA Battery operated devices (external) removed NA Heating patches and chemical warmers removed NA Titanium eyewear removed NA Nail polish cured greater than 10 hours NA Casting material cured greater than 10 hours NA Hearing aids removed NA Loose dentures or partials removed NA Prosthetics have been removed Patient demonstrates correct use of air break device (if applicable) Patient concerns have  been addressed Patient grounding bracelet on and cord attached to chamber Specifics for Inpatients (complete in addition to above) Medication sheet sent with patient Intravenous medications needed or due during therapy sent with patient Drainage tubes (e.g. nasogastric tube or chest tube secured and vented) Endotracheal or Tracheotomy tube secured Cuff deflated of air and inflated with saline Airway suctioned  Electronic Signature(s) Signed: 08/04/2019 1:40:46 PM By: Benjaman Kindler EMT/HBOT Entered By: Benjaman Kindler on 08/04/2019 13:40:45

## 2019-08-07 ENCOUNTER — Other Ambulatory Visit: Payer: Self-pay

## 2019-08-07 ENCOUNTER — Encounter (HOSPITAL_BASED_OUTPATIENT_CLINIC_OR_DEPARTMENT_OTHER): Payer: Medicare HMO | Admitting: Internal Medicine

## 2019-08-07 DIAGNOSIS — C760 Malignant neoplasm of head, face and neck: Secondary | ICD-10-CM | POA: Diagnosis not present

## 2019-08-07 DIAGNOSIS — M272 Inflammatory conditions of jaws: Secondary | ICD-10-CM | POA: Diagnosis not present

## 2019-08-07 DIAGNOSIS — M8788 Other osteonecrosis, other site: Secondary | ICD-10-CM | POA: Diagnosis not present

## 2019-08-07 NOTE — Progress Notes (Signed)
ZE, COLALUCA ([id number]) Visit Report for 08/07/2019 Arrival Information Details Patient Name: Date of Service: [name], [name] 08/07/2019 1:00 PM Medical Record [id number] Patient Account Number: [id number] Date of Birth/Sex: Treating RN: 1946/02/04 (73 y.o. M) Primary Care Dashaun Onstott: SYSTEM, PCP Other Clinician: [name] Referring Zamyra Allensworth: Treating Marna Weniger/Extender:Robson, [name] in Treatment: 8 Visit Information History Since Last Visit Added or deleted any medications: No Patient Arrived: Ambulatory Any new allergies or adverse reactions: No Arrival Time: 13:35 Had a fall or experienced change in No Accompanied By: self activities of daily living that may affect Transfer Assistance: None risk of falls: Patient Identification Verified: Yes Signs or symptoms of abuse/neglect since last No Secondary Verification Process Yes visito Completed: Hospitalized since last visit: No Patient Requires Transmission-Based No Implantable device outside of the clinic excluding No Precautions: cellular tissue based products placed in the center Patient Has Alerts: No since last visit: Pain Present Now: No Electronic Signature(s) Signed: 08/07/2019 6:06:17 PM By: [name] EMT/HBOT Entered By: [name] on 08/07/2019 14:08:56 -------------------------------------------------------------------------------- Encounter Discharge Information Details Patient Name: Date of Service: [name]. 08/07/2019 1:00 PM Medical Record [id number] Patient Account Number: [id number] Date of Birth/Sex: Treating RN: 12-22-1945 (72 y.o. M) Primary Care Milind Raether: SYSTEM, PCP Other Clinician: [name] Referring Star Resler: Treating Yavier Snider/Extender:Robson, [name] in Treatment: 8 Encounter Discharge Information Items Discharge Condition: Stable Ambulatory Status: Ambulatory Discharge Destination: Home Transportation: Private Auto Accompanied By:  self Schedule Follow-up Appointment: Yes Clinical Summary of Care: Patient Declined Electronic Signature(s) Signed: 08/07/2019 6:06:17 PM By: [name] EMT/HBOT Entered By: [name] on 08/07/2019 16:39:56 -------------------------------------------------------------------------------- Patient/Caregiver Education Details Patient Name: [name] 12/21/2020andnbsp1:00 Date of Service: PM Medical Record [id number] Number: Patient Account Number: [id number] Treating RN: [birth year] (73 y.o. Date of Birth/Gender: M) Other Clinician: [name] Primary Care Physician: SYSTEM, PCP Treating [name] Referring Physician: Physician/Extender: [name] in Treatment: 8 Education Assessment Education Provided To: Patient Education Topics Provided Hyperbaric Oxygenation: Methods: Explain/Verbal Responses: State content correctly Electronic Signature(s) Signed: 08/07/2019 6:06:17 PM By: [name] EMT/HBOT Entered By: [name] on 08/07/2019 16:39:45 -------------------------------------------------------------------------------- Vitals Details Patient Name: Date of Service: [name]. 08/07/2019 1:00 PM Medical Record [id number] Patient Account Number: [id number] Date of Birth/Sex: Treating RN: [birth year] (73 y.o. M) Primary Care Kristianne Albin: SYSTEM, PCP Other Clinician: [name] Referring Ashlley Booher: Treating Nikya Busler/Extender:Robson, [name] in Treatment: 8 Vital Signs Time Taken: 13:40 Temperature (F): 98 Height (in): 70 Pulse (bpm): 56 Weight (lbs): 152 Respiratory Rate (breaths/min): 14 Body Mass Index (BMI): 21.8 Blood Pressure (mmHg): 132/65 Reference Range: 80 - 120 mg / dl Electronic Signature(s) Signed: 08/07/2019 6:06:17 PM By: [name] EMT/HBOT Entered By: [name] on 08/07/2019 14:09:12

## 2019-08-07 NOTE — Progress Notes (Addendum)
[name], [name] ([id number]) Visit Report for 08/07/2019 HBO Details Patient Name: Date of Service: [name], [name] 08/07/2019 1:00 PM Medical Record [name] Patient Account Number: [id number] Date of Birth/Sex: Treating RN: [birth year] (73 y.o. 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 HBO Treatment Details Treatment Number: 27 Patient Type: Outpatient Chamber Type: Monoplace Chamber Serial #: [id number] Treatment Protocol: 2.5 ATA with 90 minutes oxygen, with two 5 minute air breaks Treatment Details Compression Rate Down: 2.0 psi / minute De-Compression Rate Up: 2.0 psi / minute Air breaks and CompressTx Pressure breathing periods DecompressDecompress Begins Reached (leave unused spaces Begins Ends blank) Chamber Pressure (ATA)1 2.5 2.5 2.5 2.5 2.5 --2.5 1 Clock Time (24 hr) 13:43 13:55 14:2514:3015:0015:05--15:35 15:47 Treatment Length: 124 (minutes) Treatment Segments: 4 Vital Signs Capillary Blood Glucose Reference Range: 80 - 120 mg / dl HBO Diabetic Blood Glucose Intervention Range: <131 mg/dl or >716 mg/dl Time Vitals Blood Respiratory Capillary Blood Glucose Pulse Action Type: Pulse: Temperature: Taken: Pressure: Rate: Glucose (mg/dl): Meter #: Oximetry (%) Taken: Pre 13:40 132/65 56 14 98 Post 15:50 132/59 63 14 98 Treatment Response Treatment Toleration: Well Treatment Completion Treatment Completed without Adverse Event Status: Nevan Creighton Notes No concerns with treatment given Physician HBO Attestation: I certify that I supervised this HBO treatment in accordance with Medicare guidelines. A trained Yes Yes emergency  response team is readily available per hospital policies and procedures. Continue HBOT as ordered. Yes Electronic Signature(s) Signed: 08/08/2019 7:47:18 AM By: Baltazar Najjar MD Entered By: Baltazar Najjar on 08/07/2019 16:43:22 -------------------------------------------------------------------------------- HBO Safety Checklist Details Patient Name: Date of Service: [name]. 08/07/2019 1:00 PM Medical Record [id number] Patient Account Number: [id number] Date of Birth/Sex: Treating RN: Jul 12, 1946 (73 y.o. M) Primary Care Morse Brueggemann: SYSTEM, PCP Other Clinician: Benjaman Kindler Referring Nain Rudd: Treating Jabaree Mercado/Extender:Robson, Lamar Sprinkles in Treatment: 8 HBO Safety Checklist Items Safety Checklist Consent Form Signed Patient voided / foley secured and emptied When did you last eato n/a Last dose of injectable or oral agent n/a NA Ostomy pouch emptied and vented if applicable NA All implantable devices assessed, documented and approved NA Intravenous access site secured and place Valuables secured Linens and cotton and cotton/polyester blend (less than 51% polyester) Personal oil-based products / skin lotions / body lotions removed NA Wigs or hairpieces removed NA Smoking or tobacco materials removed Books / newspapers / magazines / loose paper removed Cologne, aftershave, perfume and deodorant removed Jewelry removed (may wrap wedding band) NA Make-up removed Hair care products removed NA Battery operated devices (external) removed NA Heating patches and chemical warmers removed NA Titanium eyewear removed NA Nail polish cured greater than 10 hours NA Casting material cured greater than 10 hours NA Hearing aids removed NA Loose dentures or partials removed NA Prosthetics have been removed Patient demonstrates correct use of air break device (if applicable) Patient concerns have been addressed Patient grounding bracelet on and cord attached to  chamber Specifics for Inpatients (complete in addition to above) Medication sheet sent with patient Intravenous medications needed or due during therapy sent with patient Drainage tubes (e.g. nasogastric tube or chest tube secured and vented) Endotracheal or Tracheotomy tube secured Cuff deflated of air and inflated with saline Airway suctioned Electronic Signature(s) Signed: 08/07/2019 2:09:53 PM By: Benjaman Kindler EMT/HBOT  Entered ByBenjaman Kindler on 08/07/2019 14:09:52

## 2019-08-08 ENCOUNTER — Other Ambulatory Visit: Payer: Self-pay

## 2019-08-08 ENCOUNTER — Encounter (HOSPITAL_BASED_OUTPATIENT_CLINIC_OR_DEPARTMENT_OTHER): Payer: Medicare HMO | Admitting: Internal Medicine

## 2019-08-08 DIAGNOSIS — M8788 Other osteonecrosis, other site: Secondary | ICD-10-CM | POA: Diagnosis not present

## 2019-08-08 DIAGNOSIS — M272 Inflammatory conditions of jaws: Secondary | ICD-10-CM | POA: Diagnosis not present

## 2019-08-08 DIAGNOSIS — C760 Malignant neoplasm of head, face and neck: Secondary | ICD-10-CM | POA: Diagnosis not present

## 2019-08-08 NOTE — Progress Notes (Signed)
PERSEUS, JOSWIAK ([id number]) Visit Report for 08/07/2019 SuperBill Details Patient Name: Date of Service: [name], [name] 08/07/2019 Medical Record [id number] Patient Account Number: [id number] Date of Birth/Sex: Treating RN: 1945/10/13 (73 y.o. 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

## 2019-08-08 NOTE — Progress Notes (Signed)
SEVERT, AMORE ([id number]) Visit Report for 08/08/2019 SuperBill Details Patient Name: Date of Service: [name], [name] 08/08/2019 Medical Record [id number] Patient Account Number: [id number] Date of Birth/Sex: Treating RN: [birth year] (73 y.o. 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

## 2019-08-08 NOTE — Progress Notes (Addendum)
OZA, RAMNATH ([id number]) Visit Report for 08/08/2019 HBO Details Patient Name: Date of Service: [name], [name] 08/08/2019 1:00 PM Medical Record [id number] Patient Account Number: [id number] Date of Birth/Sex: Treating RN: 03-10-1946 (73 y.o. 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 HBO Treatment Details Treatment Number: 28 Patient Type: Outpatient Chamber Type: Monoplace Chamber Serial #: [id number] Treatment Protocol: 2.5 ATA with 90 minutes oxygen, with two 5 minute air breaks Treatment Details Compression Rate Down: 2.0 psi / minute De-Compression Rate Up: 2.0 psi / minute Air breaks and CompressTx Pressure breathing periods DecompressDecompress Begins Reached (leave unused spaces Begins Ends blank) Chamber Pressure (ATA)1 2.5 2.5 2.5 2.5 2.5 --2.5 1 Clock Time (24 hr) 13:16 13:28 13:5814:0314:3314:38--15:08 15:20 Treatment Length: 124 (minutes) Treatment Segments: 4 Vital Signs Capillary Blood Glucose Reference Range: 80 - 120 mg / dl HBO Diabetic Blood Glucose Intervention Range: <131 mg/dl or >469 mg/dl Time Vitals Blood Respiratory Capillary Blood Glucose Pulse Action Type: Pulse: Temperature: Taken: Pressure: Rate: Glucose (mg/dl): Meter #: Oximetry (%) Taken: Pre 13:05 126/65 52 17 98.2 Post 15:22 131/58 60 14 98.5 Treatment Response Treatment Toleration: Well Treatment Completion Treatment Completed without Adverse Event Status: Brit Carbonell Notes No concerns with treatment given Physician HBO Attestation: I certify that I supervised this HBO treatment in accordance with Medicare guidelines. A trained Yes Yes emergency  response team is readily available per hospital policies and procedures. Continue HBOT as ordered. Yes Electronic Signature(s) Signed: 08/08/2019 6:32:44 PM By: Baltazar Najjar MD Previous Signature: 08/08/2019 3:57:28 PM Version By: Benjaman Kindler EMT/HBOT Entered By: Baltazar Najjar on 08/08/2019 18:31:31 -------------------------------------------------------------------------------- HBO Safety Checklist Details Patient Name: Date of Service: [name] 08/08/2019 1:00 PM Medical Record [id number] Patient Account Number: [id number] Date of Birth/Sex: Treating RN: 05/30/1946 (73 y.o. M) Primary Care Kaitlyn Franko: SYSTEM, PCP Other Clinician: Benjaman Kindler Referring Nariah Morgano: Treating Analisa Sledd/Extender:Robson, Lamar Sprinkles in Treatment: 8 HBO Safety Checklist Items Safety Checklist Consent Form Signed Patient voided / foley secured and emptied When did you last eato n/a Last dose of injectable or oral agent n/a NA Ostomy pouch emptied and vented if applicable NA All implantable devices assessed, documented and approved NA Intravenous access site secured and place Valuables secured Linens and cotton and cotton/polyester blend (less than 51% polyester) Personal oil-based products / skin lotions / body lotions removed NA Wigs or hairpieces removed NA Smoking or tobacco materials removed Books / newspapers / magazines / loose paper removed Cologne, aftershave, perfume and deodorant removed Jewelry removed (may wrap wedding band) NA Make-up removed Hair care products removed NA Battery operated devices (external) removed NA Heating patches and chemical warmers removed NA Titanium eyewear removed NA Nail polish cured greater than 10 hours NA Casting material cured greater than 10 hours NA Hearing aids removed NA Loose dentures or partials removed NA Prosthetics have been removed Patient demonstrates correct use of air break device (if applicable) Patient concerns have  been addressed Patient grounding bracelet on and cord attached to chamber Specifics for Inpatients (complete in addition to above) Medication sheet sent with patient Intravenous medications needed or due during therapy sent with patient Drainage tubes (e.g. nasogastric tube or chest tube secured and vented) Endotracheal or Tracheotomy tube secured Cuff deflated of air and inflated with saline Airway suctioned  Electronic Signature(s) Signed: 08/08/2019 1:27:24 PM By: Benjaman Kindler EMT/HBOT Entered By: Benjaman Kindler on 08/08/2019 13:27:23

## 2019-08-08 NOTE — Progress Notes (Signed)
YAQOOB, KAZI ([id number]) Visit Report for 08/08/2019 Arrival Information Details Patient Name: Date of Service: [name], [name] 08/08/2019 1:00 PM Medical Record [id number] Patient Account Number: [id number] Date of Birth/Sex: Treating RN: 06-08-1946 (73 y.o. M) Primary Care Cheryn Lundquist: SYSTEM, PCP Other Clinician: [name] Referring Charmine Bockrath: Treating Brighid Koch/Extender:Robson, [name] in Treatment: 8 Visit Information History Since Last Visit Added or deleted any medications: No Patient Arrived: Ambulatory Any new allergies or adverse reactions: No Arrival Time: 13:00 Had a fall or experienced change in No Accompanied By: self activities of daily living that may affect Transfer Assistance: None risk of falls: Patient Identification Verified: Yes Signs or symptoms of abuse/neglect since last No Secondary Verification Process Yes visito Completed: Hospitalized since last visit: No Patient Requires Transmission-Based No Implantable device outside of the clinic excluding No Precautions: cellular tissue based products placed in the center Patient Has Alerts: No since last visit: Pain Present Now: No Electronic Signature(s) Signed: 08/08/2019 3:57:28 PM By: [name] EMT/HBOT Entered By: [name] on 08/08/2019 13:26:27 -------------------------------------------------------------------------------- Encounter Discharge Information Details Patient Name: Date of Service: [name]. 08/08/2019 1:00 PM Medical Record [id number] Patient Account Number: [id number] Date of Birth/Sex: Treating RN: [birth year] (73 y.o. M) Primary Care Addylin Manke: SYSTEM, PCP Other Clinician: [name] Referring Neyah Ellerman: Treating Ashanna Heinsohn/Extender:Robson, [name] in Treatment: 8 Encounter Discharge Information Items Discharge Condition: Stable Ambulatory Status: Ambulatory Discharge Destination: Home Transportation: Private Auto Accompanied By:  self Schedule Follow-up Appointment: Yes Clinical Summary of Care: Patient Declined Electronic Signature(s) Signed: 08/08/2019 3:57:28 PM By: [name] EMT/HBOT Entered By: [name] on 08/08/2019 15:56:53 -------------------------------------------------------------------------------- Patient/Caregiver Education Details Patient Name: [name] 12/22/2020andnbsp1:00 Date of Service: PM Medical Record [id number] Number: Patient Account Number: [id number] Treating RN: Jul 16, 1946 (73 y.o. Date of Birth/Gender: M) Other Clinician: [name] Primary Care Physician: SYSTEM, PCP Treating [name] Referring Physician: Physician/Extender: [name] in Treatment: 8 Education Assessment Education Provided To: Patient Education Topics Provided Hyperbaric Oxygenation: Methods: Explain/Verbal Responses: State content correctly Electronic Signature(s) Signed: 08/08/2019 3:57:28 PM By: [name] EMT/HBOT Entered By: [name] on 08/08/2019 15:56:43 -------------------------------------------------------------------------------- Vitals Details Patient Name: Date of Service: [name]. 08/08/2019 1:00 PM Medical Record [id number] Patient Account Number: [id number] Date of Birth/Sex: Treating RN: 07-Jul-1946 (73 y.o. M) Primary Care Beola Vasallo: SYSTEM, PCP Other Clinician: [name] Referring Harvey Lingo: Treating Carrick Rijos/Extender:Robson, [name] in Treatment: 8 Vital Signs Time Taken: 13:05 Temperature (F): 98.2 Height (in): 70 Pulse (bpm): 52 Weight (lbs): 152 Respiratory Rate (breaths/min): 17 Body Mass Index (BMI): 21.8 Blood Pressure (mmHg): 126/65 Reference Range: 80 - 120 mg / dl Electronic Signature(s) Signed: 08/08/2019 3:57:28 PM By: [name] EMT/HBOT Entered By: [name] on 08/08/2019 13:26:42

## 2019-08-09 ENCOUNTER — Encounter (HOSPITAL_BASED_OUTPATIENT_CLINIC_OR_DEPARTMENT_OTHER): Payer: Medicare HMO | Admitting: Physician Assistant

## 2019-08-09 DIAGNOSIS — C760 Malignant neoplasm of head, face and neck: Secondary | ICD-10-CM | POA: Diagnosis not present

## 2019-08-09 DIAGNOSIS — M272 Inflammatory conditions of jaws: Secondary | ICD-10-CM | POA: Diagnosis not present

## 2019-08-09 DIAGNOSIS — M8788 Other osteonecrosis, other site: Secondary | ICD-10-CM | POA: Diagnosis not present

## 2019-08-09 NOTE — Progress Notes (Signed)
[name] ([id number]) Visit Report for 08/09/2019 Arrival Information Details Patient Name: Date of Service: [name] 08/09/2019 1:00 PM Medical Record [id number] Patient Account Number: [id number] Date of Birth/Sex: Treating RN: 29-Mar-1946 (73 y.o. M) Primary Care Granville Whitefield: SYSTEM, PCP Other Clinician: [name] Referring Leotta Weingarten: Treating Vina Byrd/Extender:[name], [name] in Treatment: 8 Visit Information History Since Last Visit Added or deleted any medications: No Patient Arrived: Ambulatory Any new allergies or adverse reactions: No Arrival Time: 12:55 Had a fall or experienced change in No Accompanied By: self activities of daily living that may affect Transfer Assistance: None risk of falls: Patient Identification Verified: Yes Signs or symptoms of abuse/neglect since last No Secondary Verification Process Yes visito Completed: Hospitalized since last visit: No Patient Requires Transmission-Based No Implantable device outside of the clinic excluding No Precautions: cellular tissue based products placed in the center Patient Has Alerts: No since last visit: Pain Present Now: No Electronic Signature(s) Signed: 08/09/2019 3:48:24 PM By: [name] EMT/HBOT Entered By: [name] on 08/09/2019 13:21:52 -------------------------------------------------------------------------------- Encounter Discharge Information Details Patient Name: Date of Service: [name]. 08/09/2019 1:00 PM Medical Record [id number] Patient Account Number: [id number] Date of Birth/Sex: Treating RN: 06-Oct-1945 (73 y.o. M) Primary Care Ras Kollman: SYSTEM, PCP Other Clinician: [name] Referring Lesley Atkin: Treating Latrail Pounders/Extender:[name], [name] in Treatment: 8 Encounter Discharge Information Items Discharge Condition: Stable Ambulatory Status: Ambulatory Discharge Destination: Home Transportation: Private Auto Accompanied By:  self Schedule Follow-up Appointment: Yes Clinical Summary of Care: Patient Declined Electronic Signature(s) Signed: 08/09/2019 3:48:24 PM By: [name] EMT/HBOT Entered By: [name] on 08/09/2019 15:48:02 -------------------------------------------------------------------------------- Patient/Caregiver Education Details Patient Name: [name] 12/23/2020andnbsp1:00 Date of Service: PM Medical Record [id number] Number: Patient Account Number: [id number] Treating RN: 1945-11-08 (73 y.o. Date of Birth/Gender: M) Other Clinician: [name] Primary Care Physician: SYSTEM, PCP Treating [name] Referring Physician: Physician/Extender: [name] in Treatment: 8 Education Assessment Education Provided To: Patient Education Topics Provided Hyperbaric Oxygenation: Methods: Explain/Verbal Responses: State content correctly Electronic Signature(s) Signed: 08/09/2019 3:48:24 PM By: [name] EMT/HBOT Entered By: [name] on 08/09/2019 15:47:50 -------------------------------------------------------------------------------- Vitals Details Patient Name: Date of Service: [name]. 08/09/2019 1:00 PM Medical Record [id number] Patient Account Number: [id number] Date of Birth/Sex: Treating RN: 1945-10-13 (74 y.o. M) Primary Care Mackenzie Groom: SYSTEM, PCP Other Clinician: [name] Referring Faye Sanfilippo: Treating Sena Clouatre/Extender:[name], [name] in Treatment: 8 Vital Signs Time Taken: 13:00 Temperature (F): 97.6 Height (in): 70 Pulse (bpm): 55 Weight (lbs): 152 Respiratory Rate (breaths/min): 13 Body Mass Index (BMI): 21.8 Blood Pressure (mmHg): 114/58 Reference Range: 80 - 120 mg / dl Electronic Signature(s) Signed: 08/09/2019 3:48:24 PM By: [name] EMT/HBOT Entered By: [name] on 08/09/2019 13:22:09

## 2019-08-09 NOTE — Progress Notes (Addendum)
ZAYD, COGGESHALL ([id number]) Visit Report for 08/09/2019 HBO Details Patient Name: Date of Service: INDIO, SAMP 08/09/2019 1:00 PM Medical Record [id number] Patient Account Number: [id number] Date of Birth/Sex: Treating RN: 08-Jul-1946 (73 y.o. 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 HBO Treatment Details Treatment Number: 29 Patient Type: Outpatient Chamber Type: Monoplace Chamber Serial #: [id number] Treatment Protocol: 2.5 ATA with 90 minutes oxygen, with two 5 minute air breaks Treatment Details Compression Rate Down: 2.0 psi / minute De-Compression Rate Up: 2.0 psi / minute Air breaks and CompressTx Pressure breathing periods DecompressDecompress Begins Reached (leave unused spaces Begins Ends blank) Chamber Pressure (ATA)1 2.5 2.5 2.5 2.5 2.5 --2.5 1 Clock Time (24 hr) 13:13 13:25 13:5514:0014:3014:35--15:05 15:17 Treatment Length: 124 (minutes) Treatment Segments: 4 Vital Signs Capillary Blood Glucose Reference Range: 80 - 120 mg / dl HBO Diabetic Blood Glucose Intervention Range: <131 mg/dl or >213 mg/dl Time Vitals Blood Respiratory Capillary Blood Glucose Pulse Action Type: Pulse: Temperature: Taken: Pressure: Rate: Glucose (mg/dl): Meter #: Oximetry (%) Taken: Pre 13:00 114/58 55 13 97.6 Post 15:20 127/75 53 15 97.9 Treatment Response Treatment Toleration: Well Treatment Completion Treatment Completed without Adverse Event Status: Physician HBO Attestation: I certify that I supervised this HBO treatment in accordance with Medicare guidelines. A trained Yes emergency response team is readily available per hospital  policies and procedures. Continue HBOT as ordered. Yes Electronic Signature(s) Signed: 08/09/2019 3:47:50 PM By: Lenda Kelp PA-C Entered By: Lenda Kelp on 08/09/2019 15:47:50 -------------------------------------------------------------------------------- HBO Safety Checklist Details Patient Name: Date of Service: THAD, DERICK 08/09/2019 1:00 PM Medical Record [id number] Patient Account Number: [id number] Date of Birth/Sex: Treating RN: Sep 04, 1945 (73 y.o. M) Primary Care Trinadee Verhagen: SYSTEM, PCP Other Clinician: Benjaman Kindler Referring Teri Legacy: Treating Alayah Knouff/Extender:[name], Jake Samples in Treatment: 8 HBO Safety Checklist Items Safety Checklist Consent Form Signed Patient voided / foley secured and emptied When did you last eato n/a Last dose of injectable or oral agent n/a NA Ostomy pouch emptied and vented if applicable NA All implantable devices assessed, documented and approved NA Intravenous access site secured and place Valuables secured Linens and cotton and cotton/polyester blend (less than 51% polyester) Personal oil-based products / skin lotions / body lotions removed NA Wigs or hairpieces removed NA Smoking or tobacco materials removed Books / newspapers / magazines / loose paper removed Cologne, aftershave, perfume and deodorant removed Jewelry removed (may wrap wedding band) NA Make-up removed Hair care products removed NA Battery operated devices (external) removed NA Heating patches and chemical warmers removed NA Titanium eyewear removed NA Nail polish cured greater than 10 hours NA Casting material cured greater than 10 hours NA Hearing aids removed NA Loose dentures or partials removed NA Prosthetics have been removed Patient demonstrates correct use of air break device (if applicable) Patient concerns have been addressed Patient grounding bracelet on and cord attached to chamber Specifics for Inpatients (complete in addition  to above) Medication sheet sent with patient Intravenous medications needed or due during therapy sent with patient Drainage tubes (e.g. nasogastric tube or chest tube secured and vented) Endotracheal or Tracheotomy tube secured Cuff deflated of air and inflated with saline Airway suctioned Electronic Signature(s) Signed: 08/09/2019 1:22:56 PM By: Benjaman Kindler EMT/HBOT Entered By: Benjaman Kindler  on 08/09/2019 13:22:55

## 2019-08-09 NOTE — Progress Notes (Signed)
ERIKSEN, SABATELLI ([id number]) Visit Report for 08/09/2019 Problem List Details Patient Name: Date of Service: [name], [name] 08/09/2019 1:00 PM Medical Record [id number] Patient Account Number: [id number] Date of Birth/Sex: Treating RN: October 10, 1945 (73 y.o. 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, face and neck Facility Procedures CPT4: Description Modifier Quantity Code [id number] G0277-(Facility Use Only) HBOT, full body chamber, 67min 4 CPT4: [id number]-Physician attendance and supervision of hyperbaric oxygen therapy, per 1 session ICD-10 Diagnosis Description 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

## 2019-08-10 ENCOUNTER — Encounter (HOSPITAL_BASED_OUTPATIENT_CLINIC_OR_DEPARTMENT_OTHER): Payer: Medicare HMO | Admitting: Internal Medicine

## 2019-08-10 ENCOUNTER — Other Ambulatory Visit: Payer: Self-pay

## 2019-08-10 DIAGNOSIS — M8788 Other osteonecrosis, other site: Secondary | ICD-10-CM | POA: Diagnosis not present

## 2019-08-10 DIAGNOSIS — M272 Inflammatory conditions of jaws: Secondary | ICD-10-CM | POA: Diagnosis not present

## 2019-08-10 DIAGNOSIS — C760 Malignant neoplasm of head, face and neck: Secondary | ICD-10-CM | POA: Diagnosis not present

## 2019-08-10 NOTE — Progress Notes (Signed)
JASIAH, VINAS ([id number]) Visit Report for 08/10/2019 Arrival Information Details Patient Name: Date of Service: PAYTEN, ELMI 08/10/2019 1:00 PM Medical Record [id number] Patient Account Number: [id number] Date of Birth/Sex: Treating RN: July 23, 1946 (73 y.o. M) Primary Care Alayah Knouff: SYSTEM, PCP Other Clinician: [name] Referring Urijah Raynor: Treating Braydyn Schultes/Extender:Robson, [name] in Treatment: 8 Visit Information History Since Last Visit Added or deleted any medications: No Patient Arrived: Ambulatory Any new allergies or adverse reactions: No Arrival Time: 09:10 Had a fall or experienced change in No Accompanied By: self activities of daily living that may affect Transfer Assistance: None risk of falls: Patient Identification Verified: Yes Signs or symptoms of abuse/neglect since last No Secondary Verification Process Yes visito Completed: Hospitalized since last visit: No Patient Requires Transmission-Based No Implantable device outside of the clinic excluding No Precautions: cellular tissue based products placed in the center Patient Has Alerts: No since last visit: Pain Present Now: No Electronic Signature(s) Signed: 08/10/2019 11:48:58 AM By: [name] EMT/HBOT Entered By: [name] on 08/10/2019 10:01:38 -------------------------------------------------------------------------------- Encounter Discharge Information Details Patient Name: Date of Service: [name] 08/10/2019 1:00 PM Medical Record [id number] Patient Account Number: [id number] Date of Birth/Sex: Treating RN: 12-02-1945 (73 y.o. M) Primary Care Albino Bufford: SYSTEM, PCP Other Clinician: [name] Referring Kaira Stringfield: Treating Ashlen Kiger/Extender:Robson, [name] in Treatment: 8 Encounter Discharge Information Items Discharge Condition: Stable Ambulatory Status: Ambulatory Discharge Destination: Home Transportation: Private Auto Accompanied By:  self Schedule Follow-up Appointment: Yes Clinical Summary of Care: Patient Declined Electronic Signature(s) Signed: 08/10/2019 11:48:58 AM By: [name] EMT/HBOT Entered By: [name] on 08/10/2019 11:45:51 -------------------------------------------------------------------------------- Patient/Caregiver Education Details Patient Name: [name] 12/24/2020andnbsp1:00 Date of Service: PM Medical Record [id number] Number: Patient Account Number: [id number] Treating RN: [birth year] (73 y.o. Date of Birth/Gender: M) Other Clinician: [name] Primary Care Physician: SYSTEM, PCP Treating [name] Referring Physician: Physician/Extender: [name] in Treatment: 8 Education Assessment Education Provided To: Patient Education Topics Provided Hyperbaric Oxygenation: Methods: Explain/Verbal Responses: State content correctly Electronic Signature(s) Signed: 08/10/2019 11:48:58 AM By: [name] EMT/HBOT Entered By: [name] on 08/10/2019 11:47:05 -------------------------------------------------------------------------------- Vitals Details Patient Name: Date of Service: [name] 08/10/2019 1:00 PM Medical Record [id number] Patient Account Number: [id number] Date of Birth/Sex: Treating RN: August 17, 1946 (73 y.o. M) Primary Care Jerelyn Trimarco: SYSTEM, PCP Other Clinician: [name] Referring Yakub Lodes: Treating Kateleen Encarnacion/Extender:Robson, [name] in Treatment: 8 Vital Signs Time Taken: 09:15 Temperature (F): 97.8 Height (in): 70 Pulse (bpm): 57 Weight (lbs): 152 Respiratory Rate (breaths/min): 14 Body Mass Index (BMI): 21.8 Blood Pressure (mmHg): 134/67 Reference Range: 80 - 120 mg / dl Electronic Signature(s) Signed: 08/10/2019 11:48:58 AM By: [name] EMT/HBOT Entered By: [name] on 08/10/2019 10:01:56

## 2019-08-10 NOTE — Progress Notes (Signed)
TYCEN, STAIB ([id number]) Visit Report for 08/10/2019 SuperBill Details Patient Name: Date of Service: [name], [name] 08/10/2019 Medical Record [id number] Patient Account Number: [id number] Date of Birth/Sex: Treating RN: Jan 22, 1946 (73 y.o. 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

## 2019-08-10 NOTE — Progress Notes (Addendum)
WATTS, WIRKKALA ([id number]) Visit Report for 08/10/2019 HBO Details Patient Name: Date of Service: [name], [name] 08/10/2019 1:00 PM Medical Record [id number] Patient Account Number: [id number] Date of Birth/Sex: Treating RN: June 27, 1946 (73 y.o. 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 HBO [name] Details [name] Number: 30 Patient Type: Outpatient Chamber Type: Monoplace Chamber Serial #: [id number] [name] Protocol: 2.5 ATA with 90 minutes oxygen, with two 5 minute air breaks [name] Details Compression Rate Down: 2.0 psi / minute De-Compression Rate Up: 2.0 psi / minute Air breaks and CompressTx Pressure breathing periods DecompressDecompress Begins Reached (leave unused spaces Begins Ends blank) Chamber Pressure (ATA)1 2.5 2.5 2.5 2.5 2.5 --2.5 1 Clock Time (24 hr) 09:17 09:29 09:5910:0410:3410:39--11:09 11:21 [name] Length: 124 (minutes) [name] Segments: 4 Vital Signs Capillary Blood Glucose Reference Range: 80 - 120 mg / dl HBO Diabetic Blood Glucose Intervention Range: <131 mg/dl or >440 mg/dl Time Vitals Blood Respiratory Capillary Blood Glucose Pulse Action Type: Pulse: Temperature: Taken: Pressure: Rate: Glucose (mg/dl): Meter #: Oximetry (%) Taken: Pre 09:15 134/67 57 14 97.8 Post 11:33 131/55 50 13 98 [name] Response [name] Toleration: Well [name] Completion [name] Completed without Adverse Event Status: [name] Notes No concerns with [name] given Physician HBO Attestation: I certify that I supervised this HBO [name] in accordance with Medicare guidelines. A trained Yes Yes emergency  response team is readily available per hospital policies and procedures. Continue HBOT as ordered. Yes Electronic Signature(s) Signed: 08/10/2019 12:25:31 PM By: [name] Najjar MD Previous Signature: 08/10/2019 11:48:58 AM Version By: [name] EMT/HBOT Entered By: [name] on 08/10/2019 12:22:48 -------------------------------------------------------------------------------- HBO Safety Checklist Details Patient Name: Date of Service: [name]. 08/10/2019 1:00 PM Medical Record [id number] Patient Account Number: [id number] Date of Birth/Sex: Treating RN: 09-Apr-1946 (73 y.o. M) Primary Care [name]: SYSTEM, PCP Other Clinician: Benjaman [name] Referring [name]: Treating [name]/Extender:[name]: 8 HBO Safety Checklist Items Safety Checklist Consent Form Signed Patient voided / foley secured and emptied When did you last eato n/a Last dose of injectable or oral agent n/a NA Ostomy pouch emptied and vented if applicable NA All implantable devices assessed, documented and approved NA Intravenous access site secured and place Valuables secured Linens and cotton and cotton/polyester blend (less than 51% polyester) Personal oil-based products / skin lotions / body lotions removed NA Wigs or hairpieces removed NA Smoking or tobacco materials removed Books / newspapers / magazines / loose paper removed Cologne, aftershave, perfume and deodorant removed Jewelry removed (may wrap wedding band) NA Make-up removed Hair care products removed NA Battery operated devices (external) removed NA Heating patches and chemical warmers removed NA Titanium eyewear removed NA Nail polish cured greater than 10 hours NA Casting material cured greater than 10 hours NA Hearing aids removed NA Loose dentures or partials removed NA Prosthetics have been removed Patient demonstrates correct use of air break device (if applicable) Patient concerns  have been addressed Patient grounding bracelet on and cord attached to chamber Specifics for Inpatients (complete in addition to above) Medication sheet sent with patient Intravenous medications needed or due during therapy sent with patient Drainage tubes (e.g. nasogastric tube or chest tube secured and vented) Endotracheal or Tracheotomy tube secured Cuff deflated of air and inflated with saline Airway suctioned  Electronic Signature(s) Signed: 08/10/2019 10:02:33 AM By: [name] EMT/HBOT Entered By: [name] on 08/10/2019 10:02:32

## 2019-08-14 ENCOUNTER — Encounter (HOSPITAL_BASED_OUTPATIENT_CLINIC_OR_DEPARTMENT_OTHER): Payer: Medicare HMO | Admitting: Internal Medicine

## 2019-08-14 ENCOUNTER — Other Ambulatory Visit: Payer: Self-pay

## 2019-08-14 DIAGNOSIS — M272 Inflammatory conditions of jaws: Secondary | ICD-10-CM | POA: Diagnosis not present

## 2019-08-14 DIAGNOSIS — M8788 Other osteonecrosis, other site: Secondary | ICD-10-CM | POA: Diagnosis not present

## 2019-08-14 DIAGNOSIS — C760 Malignant neoplasm of head, face and neck: Secondary | ICD-10-CM | POA: Diagnosis not present

## 2019-08-14 NOTE — Progress Notes (Addendum)
[name] ([id number]) Visit Report for 08/14/2019 HBO Details Patient Name: Date of Service: [name], [name] 08/14/2019 1:00 PM Medical Record [id number] Patient Account Number: [id number] Date of Birth/Sex: Treating RN: 1946/02/05 (73 y.o. 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 HBO Treatment Details Treatment Number: 31 Patient Type: Outpatient Chamber Type: Monoplace Chamber Serial #: [id number] Treatment Protocol: 2.5 ATA with 90 minutes oxygen, with two 5 minute air breaks Treatment Details Compression Rate Down: 2.0 psi / minute De-Compression Rate Up: 2.0 psi / minute Air breaks and CompressTx Pressure breathing periods DecompressDecompress Begins Reached (leave unused spaces Begins Ends blank) Chamber Pressure (ATA)1 2.5 2.5 2.5 2.5 2.5 --2.5 1 Clock Time (24 hr) 13:02 13:14 13:4413:4914:1914:24--14:54 15:06 Treatment Length: 124 (minutes) Treatment Segments: 4 Vital Signs Capillary Blood Glucose Reference Range: 80 - 120 mg / dl HBO Diabetic Blood Glucose Intervention Range: <131 mg/dl or >601 mg/dl Time Vitals Blood Respiratory Capillary Blood Glucose Pulse Action Type: Pulse: Temperature: Taken: Pressure: Rate: Glucose (mg/dl): Meter #: Oximetry (%) Taken: Pre 13:00 139/81 57 14 98 Post 15:08 133/63 52 14 98.2 Treatment Response Treatment Toleration: Well Treatment Completion Treatment Completed without Adverse Event Status: [name] Notes No concerns with treatment given Physician HBO Attestation: I certify that I supervised this HBO treatment in accordance with Medicare guidelines. A trained Yes Yes emergency  response team is readily available per hospital policies and procedures. Continue HBOT as ordered. Yes Electronic Signature(s) Signed: 08/14/2019 6:10:19 PM By: [name] Najjar MD Previous Signature: 08/14/2019 4:06:01 PM Version By: [name] EMT/HBOT Entered By: [name] on 08/14/2019 18:09:25 -------------------------------------------------------------------------------- HBO Safety Checklist Details Patient Name: Date of Service: [name]. 08/14/2019 1:00 PM Medical Record [id number] Patient Account Number: [id number] Date of Birth/Sex: Treating RN: [birth year] (73 y.o. M) Primary Care Pantelis Elgersma: SYSTEM, PCP Other Clinician: Benjaman [name] Referring Enrique Weiss: Treating Hari Casaus/Extender:[name] in Treatment: 9 HBO Safety Checklist Items Safety Checklist Consent Form Signed Patient voided / foley secured and emptied When did you last eato n/a Last dose of injectable or oral agent n/a NA Ostomy pouch emptied and vented if applicable NA All implantable devices assessed, documented and approved NA Intravenous access site secured and place Valuables secured Linens and cotton and cotton/polyester blend (less than 51% polyester) Personal oil-based products / skin lotions / body lotions removed NA Wigs or hairpieces removed NA Smoking or tobacco materials removed Books / newspapers / magazines / loose paper removed Cologne, aftershave, perfume and deodorant removed Jewelry removed (may wrap wedding band) NA Make-up removed Hair care products removed NA Battery operated devices (external) removed NA Heating patches and chemical warmers removed NA Titanium eyewear removed NA Nail polish cured greater than 10 hours NA Casting material cured greater than 10 hours NA Hearing aids removed NA Loose dentures or partials removed NA Prosthetics have been removed Patient demonstrates correct use of air break device (if applicable) Patient concerns have  been addressed Patient grounding bracelet on and cord attached to chamber Specifics for Inpatients (complete in addition to above) Medication sheet sent with patient Intravenous medications needed or due during therapy sent with patient Drainage tubes (e.g. nasogastric tube or chest tube secured and vented) Endotracheal or Tracheotomy tube secured Cuff deflated of air and inflated with saline Airway suctioned  Electronic Signature(s) Signed: 08/14/2019 1:33:51 PM By: [name] EMT/HBOT Entered By: [name] on 08/14/2019 13:33:50

## 2019-08-14 NOTE — Progress Notes (Signed)
[name], [name] ([id number]) Visit Report for 08/14/2019 SuperBill Details Patient Name: Date of Service: [name], [name] 08/14/2019 Medical Record [id number] Patient Account Number: [id number] Date of Birth/Sex: Treating RN: 22-Oct-1945 (73 y.o. 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

## 2019-08-14 NOTE — Progress Notes (Signed)
CLENT, LAFON ([id number]) Visit Report for 08/14/2019 Arrival Information Details Patient Name: Date of Service: [name], [name] 08/14/2019 1:00 PM Medical Record [id number] Patient Account Number: [id number] Date of Birth/Sex: Treating RN: [birth year] (73 y.o. M) Primary Care Pietra Zuluaga: SYSTEM, PCP Other Clinician: [name] Referring Raford Brissett: Treating Kaarin Pardy/Extender:Robson, [name] in Treatment: 9 Visit Information History Since Last Visit Added or deleted any medications: No Patient Arrived: Ambulatory Any new allergies or adverse reactions: No Arrival Time: 12:55 Had a fall or experienced change in No Accompanied By: self activities of daily living that may affect Transfer Assistance: None risk of falls: Patient Identification Verified: Yes Signs or symptoms of abuse/neglect since last No Secondary Verification Process Yes visito Completed: Hospitalized since last visit: No Patient Requires Transmission-Based No Implantable device outside of the clinic excluding No Precautions: cellular tissue based products placed in the center Patient Has Alerts: No since last visit: Pain Present Now: No Electronic Signature(s) Signed: 08/14/2019 4:06:01 PM By: [name] EMT/HBOT Entered By: [name] on 08/14/2019 13:33:02 -------------------------------------------------------------------------------- Encounter Discharge Information Details Patient Name: Date of Service: [name]. 08/14/2019 1:00 PM Medical Record [id number] Patient Account Number: [id number] Date of Birth/Sex: Treating RN: 1946/08/08 (73 y.o. M) Primary Care Freddye Cardamone: SYSTEM, PCP Other Clinician: [name] Referring Tasheba Henson: Treating Kenyanna Grzesiak/Extender:Robson, [name] in Treatment: 9 Encounter Discharge Information Items Discharge Condition: Stable Ambulatory Status: Ambulatory Discharge Destination: Home Transportation: Private Auto Accompanied By:  self Schedule Follow-up Appointment: Yes Clinical Summary of Care: Patient Declined Electronic Signature(s) Signed: 08/14/2019 4:06:01 PM By: [name] EMT/HBOT Entered By: [name] on 08/14/2019 15:36:35 -------------------------------------------------------------------------------- Patient/Caregiver Education Details Patient Name: [name] 12/28/2020andnbsp1:00 Date of Service: PM Medical Record [id number] Number: Patient Account Number: [id number] Treating RN: 1945/08/30 (73 y.o. Date of Birth/Gender: M) Other Clinician: [name] Primary Care Physician: SYSTEM, PCP Treating [name] Referring Physician: Physician/Extender: [name] in Treatment: 9 Education Assessment Education Provided To: Patient Education Topics Provided Hyperbaric Oxygenation: Methods: Explain/Verbal Responses: State content correctly Electronic Signature(s) Signed: 08/14/2019 4:06:01 PM By: [name] EMT/HBOT Entered By: [name] on 08/14/2019 15:36:23 -------------------------------------------------------------------------------- Vitals Details Patient Name: Date of Service: [name]. 08/14/2019 1:00 PM Medical Record [id number] Patient Account Number: [id number] Date of Birth/Sex: Treating RN: 12-Sep-1945 (73 y.o. M) Primary Care Zayan Delvecchio: SYSTEM, PCP Other Clinician: [name] Referring Myrtha Tonkovich: Treating Rashee Marschall/Extender:Robson, [name] in Treatment: 9 Vital Signs Time Taken: 13:00 Temperature (F): 98 Height (in): 70 Pulse (bpm): 57 Weight (lbs): 152 Respiratory Rate (breaths/min): 14 Body Mass Index (BMI): 21.8 Blood Pressure (mmHg): 139/81 Reference Range: 80 - 120 mg / dl Electronic Signature(s) Signed: 08/14/2019 4:06:01 PM By: [name] EMT/HBOT Entered By: [name] on 08/14/2019 13:33:17

## 2019-08-15 ENCOUNTER — Encounter (HOSPITAL_BASED_OUTPATIENT_CLINIC_OR_DEPARTMENT_OTHER): Payer: Medicare HMO | Admitting: Internal Medicine

## 2019-08-15 DIAGNOSIS — C760 Malignant neoplasm of head, face and neck: Secondary | ICD-10-CM | POA: Diagnosis not present

## 2019-08-15 DIAGNOSIS — M8788 Other osteonecrosis, other site: Secondary | ICD-10-CM | POA: Diagnosis not present

## 2019-08-15 DIAGNOSIS — M272 Inflammatory conditions of jaws: Secondary | ICD-10-CM | POA: Diagnosis not present

## 2019-08-15 NOTE — Progress Notes (Signed)
CAIMEN, BLECHINGER ([id number]) Visit Report for 08/15/2019 SuperBill Details Patient Name: Date of Service: [name], [name] 08/15/2019 Medical Record [id number] Patient Account Number: [id number] Date of Birth/Sex: Treating RN: 1946-07-08 (73 y.o. 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

## 2019-08-15 NOTE — Progress Notes (Signed)
[name], [name] ([id number]) Visit Report for 08/15/2019 Arrival Information Details Patient Name: Date of Service: [name], [name] 08/15/2019 1:00 PM Medical Record [id number] Patient Account Number: [id number] Date of Birth/Sex: Treating RN: 08/19/1945 (73 y.o. M) Primary Care Callin Ashe: SYSTEM, PCP Other Clinician: [name] Referring Aundreya Souffrant: Treating Won Kreuzer/Extender:Robson, [name] in Treatment: 9 Visit Information History Since Last Visit Added or deleted any medications: No Patient Arrived: Ambulatory Any new allergies or adverse reactions: No Arrival Time: 13:00 Had a fall or experienced change in No Accompanied By: self activities of daily living that may affect Transfer Assistance: None risk of falls: Patient Identification Verified: Yes Signs or symptoms of abuse/neglect since last No Secondary Verification Process Yes visito Completed: Hospitalized since last visit: No Patient Requires Transmission-Based No Implantable device outside of the clinic excluding No Precautions: cellular tissue based products placed in the center Patient Has Alerts: No since last visit: Pain Present Now: No Electronic Signature(s) Signed: 08/15/2019 3:43:24 PM By: [name] EMT/HBOT Entered By: [name] on 08/15/2019 13:29:53 -------------------------------------------------------------------------------- Encounter Discharge Information Details Patient Name: Date of Service: [name]. 08/15/2019 1:00 PM Medical Record [id number] Patient Account Number: [id number] Date of Birth/Sex: Treating RN: 02/24/1946 (73 y.o. M) Primary Care Peniel Biel: SYSTEM, PCP Other Clinician: [name] Referring Catrena Vari: Treating Duel Conrad/Extender:Robson, [name] in Treatment: 9 Encounter Discharge Information Items Discharge Condition: Stable Ambulatory Status: Ambulatory Discharge Destination: Home Transportation: Private Auto Accompanied By:  self Schedule Follow-up Appointment: Yes Clinical Summary of Care: Patient Declined Electronic Signature(s) Signed: 08/15/2019 3:43:24 PM By: [name] EMT/HBOT Entered By: [name] on 08/15/2019 15:41:36 -------------------------------------------------------------------------------- Patient/Caregiver Education Details Patient Name: [name] 12/29/2020andnbsp1:00 Date of Service: PM Medical Record [id number] Number: Patient Account Number: [id number] Treating RN: [birth year] (73 y.o. Date of Birth/Gender: M) Other Clinician: [name] Primary Care Physician: SYSTEM, PCP Treating [name] Referring Physician: Physician/Extender: [name] in Treatment: 9 Education Assessment Education Provided To: Patient Education Topics Provided Hyperbaric Oxygenation: Methods: Explain/Verbal Responses: State content correctly Electronic Signature(s) Signed: 08/15/2019 3:43:24 PM By: [name] EMT/HBOT Entered By: [name] on 08/15/2019 15:41:25 -------------------------------------------------------------------------------- Vitals Details Patient Name: Date of Service: [name]. 08/15/2019 1:00 PM Medical Record [id number] Patient Account Number: [id number] Date of Birth/Sex: Treating RN: 04/22/1946 (73 y.o. M) Primary Care Skyleigh Windle: SYSTEM, PCP Other Clinician: [name] Referring Yaneli Keithley: Treating Chavis Tessler/Extender:Robson, [name] in Treatment: 9 Vital Signs Time Taken: 13:05 Temperature (F): 97.8 Height (in): 70 Pulse (bpm): 64 Weight (lbs): 152 Respiratory Rate (breaths/min): 16 Body Mass Index (BMI): 21.8 Blood Pressure (mmHg): 134/72 Reference Range: 80 - 120 mg / dl Electronic Signature(s) Signed: 08/15/2019 3:43:24 PM By: [name] EMT/HBOT Entered By: [name] on 08/15/2019 13:30:07

## 2019-08-15 NOTE — Progress Notes (Addendum)
[name], [name] ([id number]) Visit Report for 08/15/2019 HBO Details Patient Name: Date of Service: BARTT, HA 08/15/2019 1:00 PM Medical Record [id number] Patient Account Number: [id number] Date of Birth/Sex: Treating RN: 06-02-1946 (73 y.o. 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 HBO Treatment Details Treatment Number: 32 Patient Type: Outpatient Chamber Type: Monoplace Chamber Serial #: [id number] Treatment Protocol: 2.5 ATA with 90 minutes oxygen, with two 5 minute air breaks Treatment Details Compression Rate Down: 2.0 psi / minute De-Compression Rate Up: 2.0 psi / minute Air breaks and CompressTx Pressure breathing periods DecompressDecompress Begins Reached (leave unused spaces Begins Ends blank) Chamber Pressure (ATA)1 2.5 2.5 2.5 2.5 2.5 --2.5 1 Clock Time (24 hr) 13:08 13:20 13:5013:5514:2514:30--15:00 15:12 Treatment Length: 124 (minutes) Treatment Segments: 4 Vital Signs Capillary Blood Glucose Reference Range: 80 - 120 mg / dl HBO Diabetic Blood Glucose Intervention Range: <131 mg/dl or >413 mg/dl Time Vitals Blood Respiratory Capillary Blood Glucose Pulse Action Type: Pulse: Temperature: Taken: Pressure: Rate: Glucose (mg/dl): Meter #: Oximetry (%) Taken: Pre 13:05 134/72 64 16 97.8 Post 15:15 136/64 52 14 98.6 Treatment Response Treatment Toleration: Well Treatment Completion Treatment Completed without Adverse Event Status: Wynee Matarazzo Notes No concerns with treatment given Physician HBO Attestation: I certify that I supervised this HBO treatment in accordance with Medicare guidelines. A trained Yes Yes emergency  response team is readily available per hospital policies and procedures. Continue HBOT as ordered. Yes Electronic Signature(s) Signed: 08/15/2019 6:15:30 PM By: Baltazar Najjar MD Previous Signature: 08/15/2019 3:43:24 PM Version By: Benjaman Kindler EMT/HBOT Entered By: Baltazar Najjar on 08/15/2019 17:44:32 -------------------------------------------------------------------------------- HBO Safety Checklist Details Patient Name: Date of Service: [name]. 08/15/2019 1:00 PM Medical Record [id number] Patient Account Number: [id number] Date of Birth/Sex: Treating RN: [birth year] (73 y.o. M) Primary Care Sherrin Stahle: SYSTEM, PCP Other Clinician: Benjaman Kindler Referring Kamil Hanigan: Treating Kimmora Risenhoover/Extender:Robson, Lamar Sprinkles in Treatment: 9 HBO Safety Checklist Items Safety Checklist Consent Form Signed Patient voided / foley secured and emptied When did you last eato n/a Last dose of injectable or oral agent n/a NA Ostomy pouch emptied and vented if applicable NA All implantable devices assessed, documented and approved NA Intravenous access site secured and place Valuables secured Linens and cotton and cotton/polyester blend (less than 51% polyester) Personal oil-based products / skin lotions / body lotions removed NA Wigs or hairpieces removed NA Smoking or tobacco materials removed Books / newspapers / magazines / loose paper removed Cologne, aftershave, perfume and deodorant removed Jewelry removed (may wrap wedding band) NA Make-up removed Hair care products removed NA Battery operated devices (external) removed NA Heating patches and chemical warmers removed NA Titanium eyewear removed NA Nail polish cured greater than 10 hours NA Casting material cured greater than 10 hours NA Hearing aids removed NA Loose dentures or partials removed NA Prosthetics have been removed Patient demonstrates correct use of air break device (if applicable) Patient concerns have  been addressed Patient grounding bracelet on and cord attached to chamber Specifics for Inpatients (complete in addition to above) Medication sheet sent with patient Intravenous medications needed or due during therapy sent with patient Drainage tubes (e.g. nasogastric tube or chest tube secured and vented) Endotracheal or Tracheotomy tube secured Cuff deflated of air and inflated with saline Airway suctioned  Electronic Signature(s) Signed: 08/15/2019 1:30:42 PM By: Benjaman Kindler EMT/HBOT Entered By: Benjaman Kindler on 08/15/2019 13:30:41

## 2019-08-16 ENCOUNTER — Other Ambulatory Visit: Payer: Self-pay

## 2019-08-16 ENCOUNTER — Encounter (HOSPITAL_BASED_OUTPATIENT_CLINIC_OR_DEPARTMENT_OTHER): Payer: Medicare HMO | Admitting: Internal Medicine

## 2019-08-16 DIAGNOSIS — M8788 Other osteonecrosis, other site: Secondary | ICD-10-CM | POA: Diagnosis not present

## 2019-08-16 DIAGNOSIS — C760 Malignant neoplasm of head, face and neck: Secondary | ICD-10-CM | POA: Diagnosis not present

## 2019-08-16 DIAGNOSIS — M272 Inflammatory conditions of jaws: Secondary | ICD-10-CM | POA: Diagnosis not present

## 2019-08-16 NOTE — Progress Notes (Signed)
[name], [name] ([id number]) Visit Report for 08/16/2019 Arrival Information Details Patient Name: Date of Service: [name], [name] 08/16/2019 1:00 PM Medical Record [id number] Patient Account Number: [id number] Date of Birth/Sex: Treating RN: 06-30-1946 (73 y.o. M) Primary Care Ligia Duguay: SYSTEM, PCP Other Clinician: [name] Referring Gabrianna Fassnacht: Treating Shea Kapur/Extender:Madduri, Dannial Monarch in Treatment: 9 Visit Information History Since Last Visit Added or deleted any medications: No Patient Arrived: Ambulatory Any new allergies or adverse reactions: No Arrival Time: 13:05 Had a fall or experienced change in No Accompanied By: self activities of daily living that may affect Transfer Assistance: None risk of falls: Patient Identification Verified: Yes Signs or symptoms of abuse/neglect since last No Secondary Verification Process Yes visito Completed: Hospitalized since last visit: No Patient Requires Transmission-Based No Implantable device outside of the clinic excluding No Precautions: cellular tissue based products placed in the center Patient Has Alerts: No since last visit: Pain Present Now: No Electronic Signature(s) Signed: 08/16/2019 3:39:15 PM By: [name] EMT/HBOT Entered By: [name] on 08/16/2019 13:45:19 -------------------------------------------------------------------------------- Encounter Discharge Information Details Patient Name: Date of Service: [name]. 08/16/2019 1:00 PM Medical Record [id number] Patient Account Number: [id number] Date of Birth/Sex: Treating RN: [birth year] (73 y.o. M) Primary Care Terran Hollenkamp: SYSTEM, PCP Other Clinician: [name] Referring Viraj Liby: Treating Jaydian Santana/Extender:Madduri, Dannial Monarch in Treatment: 9 Encounter Discharge Information Items Discharge Condition: Stable Ambulatory Status: Ambulatory Discharge Destination: Home Transportation: Private Auto Accompanied By:  self Schedule Follow-up Appointment: Yes Clinical Summary of Care: Patient Declined Electronic Signature(s) Signed: 08/16/2019 3:39:15 PM By: [name] EMT/HBOT Entered By: [name] on 08/16/2019 15:37:00 -------------------------------------------------------------------------------- Patient/Caregiver Education Details Patient Name: [name] 12/30/2020andnbsp1:00 Date of Service: PM Medical Record [id number] Number: Patient Account Number: [id number] Treating RN: [birth year] (73 y.o. Date of Birth/Gender: M) Other Clinician: [name] Primary Care Physician: SYSTEM, PCP Treating Tobi Bastos Referring Physician: Physician/Extender: [name] in Treatment: 9 Education Assessment Education Provided To: Patient Education Topics Provided Hyperbaric Oxygenation: Methods: Explain/Verbal Responses: State content correctly Electronic Signature(s) Signed: 08/16/2019 3:39:15 PM By: [name] EMT/HBOT Entered By: [name] on 08/16/2019 15:36:49 -------------------------------------------------------------------------------- Vitals Details Patient Name: Date of Service: [name]. 08/16/2019 1:00 PM Medical Record [id number] Patient Account Number: [id number] Date of Birth/Sex: Treating RN: Jan 09, 1946 (73 y.o. M) Primary Care Khallid Pasillas: SYSTEM, PCP Other Clinician: [name] Referring Kilah Drahos: Treating Lynnel Zanetti/Extender:Madduri, Dannial Monarch in Treatment: 9 Vital Signs Time Taken: 13:10 Temperature (F): 97.8 Height (in): 70 Pulse (bpm): 59 Weight (lbs): 152 Respiratory Rate (breaths/min): 15 Body Mass Index (BMI): 21.8 Blood Pressure (mmHg): 134/88 Reference Range: 80 - 120 mg / dl Electronic Signature(s) Signed: 08/16/2019 3:39:15 PM By: [name] EMT/HBOT Entered By: [name] on 08/16/2019 13:45:50

## 2019-08-16 NOTE — Progress Notes (Signed)
KETAN, MUELLER ([id number]) Visit Report for 08/16/2019 SuperBill Details Patient Name: Date of Service: [name], [name] 08/16/2019 Medical Record [id number] Patient Account Number: [id number] Date of Birth/Sex: Treating RN: [birth year] (73 y.o. 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

## 2019-08-16 NOTE — Progress Notes (Addendum)
[name], [name] ([id number]) Visit Report for 08/16/2019 HBO Details Patient Name: Date of Service: [name], [name] 08/16/2019 1:00 PM Medical Record [id number] Patient Account Number: [id number] Date of Birth/Sex: Treating RN: 1945-12-24 (73 y.o. 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 HBO Treatment Details Treatment Number: 33 Patient Type: Outpatient Chamber Type: Monoplace Chamber Serial #: [id number] Treatment Protocol: 2.5 ATA with 90 minutes oxygen, with two 5 minute air breaks Treatment Details Compression Rate Down: 2.0 psi / minute De-Compression Rate Up: 2.0 psi / minute Air breaks and CompressTx Pressure breathing periods DecompressDecompress Begins Reached (leave unused spaces Begins Ends blank) Chamber Pressure (ATA)1 2.5 2.5 2.5 2.5 2.5 --2.5 1 Clock Time (24 hr) 13:12 13:24 [id number] 15:16 Treatment Length: 124 (minutes) Treatment Segments: 4 Vital Signs Capillary Blood Glucose Reference Range: 80 - 120 mg / dl HBO Diabetic Blood Glucose Intervention Range: <131 mg/dl or >249 mg/dl Time Vitals Blood Respiratory Capillary Blood Glucose Pulse Action Type: Pulse: Temperature: Taken: Pressure: Rate: Glucose (mg/dl): Meter #: Oximetry (%) Taken: Pre 13:10 134/88 59 15 97.8 Post 15:18 139/62 50 14 98.2 Treatment Response Treatment Toleration: Well Treatment Completion Treatment Completed without Adverse Event Status: Electronic Signature(s) Signed: 08/16/2019 3:39:15 PM By: [name] EMT/HBOT Signed: 08/16/2019 5:10:58 PM By: Tobi Bastos MD, MBA Entered By: [name] on 08/16/2019  15:36:30 -------------------------------------------------------------------------------- HBO Safety Checklist Details Patient Name: Date of Service: [name]. 08/16/2019 1:00 PM Medical Record [id number] Patient Account Number: [id number] Date of Birth/Sex: Treating RN: 09-07-1945 (73 y.o. M) Primary Care Azarah Dacy: SYSTEM, PCP Other Clinician: [name] Referring Rhealyn Cullen: Treating Cordie Buening/Extender:Madduri, Dannial Monarch in Treatment: 9 HBO Safety Checklist Items Safety Checklist Consent Form Signed Patient voided / foley secured and emptied When did you last eato n/a Last dose of injectable or oral agent n/a NA Ostomy pouch emptied and vented if applicable NA All implantable devices assessed, documented and approved NA Intravenous access site secured and place Valuables secured Linens and cotton and cotton/polyester blend (less than 51% polyester) Personal oil-based products / skin lotions / body lotions removed NA Wigs or hairpieces removed NA Smoking or tobacco materials removed Books / newspapers / magazines / loose paper removed Cologne, aftershave, perfume and deodorant removed Jewelry removed (may wrap wedding band) NA Make-up removed Hair care products removed NA Battery operated devices (external) removed NA Heating patches and chemical warmers removed NA Titanium eyewear removed NA Nail polish cured greater than 10 hours NA Casting material cured greater than 10 hours NA Hearing aids removed NA Loose dentures or partials removed NA Prosthetics have been removed Patient demonstrates correct use of air break device (if applicable) Patient concerns have been addressed Patient grounding bracelet on and cord attached to chamber Specifics for Inpatients (complete in addition to above) Medication sheet sent with patient Intravenous medications needed or due during therapy sent with patient Drainage tubes (e.g. nasogastric tube or chest tube secured  and vented) Endotracheal or Tracheotomy tube secured Cuff deflated of air and inflated with saline Airway suctioned Electronic Signature(s) Signed: 08/16/2019 1:46:37 PM By: [name] EMT/HBOT Entered By: [name] on 08/16/2019 13:46:37

## 2019-08-17 ENCOUNTER — Encounter (HOSPITAL_BASED_OUTPATIENT_CLINIC_OR_DEPARTMENT_OTHER): Payer: Medicare HMO | Admitting: Internal Medicine

## 2019-08-17 ENCOUNTER — Other Ambulatory Visit: Payer: Self-pay

## 2019-08-17 DIAGNOSIS — C760 Malignant neoplasm of head, face and neck: Secondary | ICD-10-CM | POA: Diagnosis not present

## 2019-08-17 DIAGNOSIS — M272 Inflammatory conditions of jaws: Secondary | ICD-10-CM | POA: Diagnosis not present

## 2019-08-17 DIAGNOSIS — M8788 Other osteonecrosis, other site: Secondary | ICD-10-CM | POA: Diagnosis not present

## 2019-08-17 NOTE — Progress Notes (Addendum)
[name] ([id number]) Visit Report for 08/17/2019 HBO Details Patient Name: Date of Service: [name], [name] 08/17/2019 10:00 AM Medical Record [id number] Patient Account Number: [id number] Date of Birth/Sex: Treating RN: 21-Jun-1946 (73 y.o. 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 HBO Treatment Details Treatment Number: 34 Patient Type: Outpatient Chamber Type: Monoplace Chamber Serial #: [id number] Treatment Protocol: 2.5 ATA with 90 minutes oxygen, with two 5 minute air breaks Treatment Details Compression Rate Down: 2.0 psi / minute De-Compression Rate Up: 2.0 psi / minute Air breaks and CompressTx Pressure breathing periods DecompressDecompress Begins Reached (leave unused spaces Begins Ends blank) Chamber Pressure (ATA)1 2.5 2.5 2.5 2.5 2.5 --2.5 1 Clock Time (24 hr) 09:49 10:01 10:3110:3611:0611:11--11:41 11:53 Treatment Length: 124 (minutes) Treatment Segments: 4 Vital Signs Capillary Blood Glucose Reference Range: 80 - 120 mg / dl HBO Diabetic Blood Glucose Intervention Range: <131 mg/dl or >213 mg/dl Time Vitals Blood Respiratory Capillary Blood Glucose Pulse Action Type: Pulse: Temperature: Taken: Pressure: Rate: Glucose (mg/dl): Meter #: Oximetry (%) Taken: Pre 09:35 125/61 52 15 98.2 Post 11:55 137/64 50 16 97.6 Treatment Response Treatment Toleration: Well Treatment Completion Treatment Completed without Adverse Event Status: [name] Notes No concerns with treatment given Physician HBO Attestation: I certify that I supervised this HBO treatment in accordance with Medicare guidelines. A trained Yes Yes emergency  response team is readily available per hospital policies and procedures. Continue HBOT as ordered. Yes Electronic Signature(s) Signed: 08/17/2019 2:35:12 PM By: Baltazar Najjar MD Previous Signature: 08/17/2019 12:26:55 PM Version By: Benjaman Kindler EMT/HBOT Entered By: Baltazar Najjar on 08/17/2019 13:07:11 -------------------------------------------------------------------------------- HBO Safety Checklist Details Patient Name: Date of Service: [name] [facility]. 08/17/2019 10:00 AM Medical Record [id number] Patient Account Number: [id number] Date of Birth/Sex: Treating RN: 1946-06-01 (73 y.o. M) Primary Care Dadrian Ballantine: SYSTEM, PCP Other Clinician: Benjaman Kindler Referring Anavey Coombes: Treating Gracilyn Gunia/Extender:Robson, Lamar Sprinkles in Treatment: 9 HBO Safety Checklist Items Safety Checklist Consent Form Signed Patient voided / foley secured and emptied When did you last eato n/a Last dose of injectable or oral agent n/a NA Ostomy pouch emptied and vented if applicable NA All implantable devices assessed, documented and approved NA Intravenous access site secured and place Valuables secured Linens and cotton and cotton/polyester blend (less than 51% polyester) Personal oil-based products / skin lotions / body lotions removed NA Wigs or hairpieces removed NA Smoking or tobacco materials removed Books / newspapers / magazines / loose paper removed Cologne, aftershave, perfume and deodorant removed Jewelry removed (may wrap wedding band) NA Make-up removed Hair care products removed NA Battery operated devices (external) removed NA Heating patches and chemical warmers removed NA Titanium eyewear removed NA Nail polish cured greater than 10 hours NA Casting material cured greater than 10 hours NA Hearing aids removed NA Loose dentures or partials removed NA Prosthetics have been removed Patient demonstrates correct use of air break device (if applicable) Patient concerns  have been addressed Patient grounding bracelet on and cord attached to chamber Specifics for Inpatients (complete in addition to above) Medication sheet sent with patient Intravenous medications needed or due during therapy sent with patient Drainage tubes (e.g. nasogastric tube or chest tube secured and vented) Endotracheal or Tracheotomy tube secured Cuff deflated of air and inflated with saline Airway suctioned  Electronic Signature(s) Signed: 08/17/2019 10:24:01 AM By: Benjaman Kindler EMT/HBOT Entered By: Benjaman Kindler on 08/17/2019 10:24:01

## 2019-08-17 NOTE — Progress Notes (Signed)
[name], [name] ([id number]) Visit Report for 08/17/2019 Arrival Information Details Patient Name: Date of Service: [name], [name] 08/17/2019 10:00 AM Medical Record [id number] Patient Account Number: [id number] Date of Birth/Sex: Treating RN: Feb 09, 1946 (73 y.o. M) Primary Care Jedrick Hutcherson: SYSTEM, PCP Other Clinician: [name] Referring Ladaisha Portillo: Treating Alister Staver/Extender:Robson, [name] in Treatment: 9 Visit Information History Since Last Visit Added or deleted any medications: No Patient Arrived: Ambulatory Any new allergies or adverse reactions: No Arrival Time: 09:30 Had a fall or experienced change in No Accompanied By: self activities of daily living that may affect Transfer Assistance: None risk of falls: Patient Identification Verified: Yes Signs or symptoms of abuse/neglect since last No Secondary Verification Process Yes visito Completed: Hospitalized since last visit: No Patient Requires Transmission-Based No Implantable device outside of the clinic excluding No Precautions: cellular tissue based products placed in the center Patient Has Alerts: No since last visit: Pain Present Now: No Electronic Signature(s) Signed: 08/17/2019 12:26:55 PM By: [name] EMT/HBOT Entered By: [name] on 08/17/2019 10:23:10 -------------------------------------------------------------------------------- Encounter Discharge Information Details Patient Name: Date of Service: [name]. 08/17/2019 10:00 AM Medical Record [id number] Patient Account Number: [id number] Date of Birth/Sex: Treating RN: 12/07/1945 (73 y.o. M) Primary Care Malachi Kinzler: SYSTEM, PCP Other Clinician: [name] Referring Tatyana Biber: Treating Luke Falero/Extender:Robson, [name] in Treatment: 9 Encounter Discharge Information Items Discharge Condition: Stable Ambulatory Status: Ambulatory Discharge Destination: Home Transportation: Private Auto Accompanied By:  self Schedule Follow-up Appointment: Yes Clinical Summary of Care: Patient Declined Electronic Signature(s) Signed: 08/17/2019 12:26:55 PM By: [name] EMT/HBOT Entered By: [name] on 08/17/2019 12:24:17 -------------------------------------------------------------------------------- Patient/Caregiver Education Details Patient Name: [name] 12/31/2020andnbsp10:00 Date of Service: AM Medical Record [id number] Number: Patient Account Number: [id number] Treating RN: 02-23-1946 (73 y.o. Date of Birth/Gender: M) Other Clinician: [name] Primary Care Treating SYSTEM, PCP [name] Physician: Physician/Extender: Referring Physician: [name] in Treatment: 9 Education Assessment Education Provided To: Patient Education Topics Provided Hyperbaric Oxygenation: Methods: Explain/Verbal Responses: State content correctly Electronic Signature(s) Signed: 08/17/2019 12:26:55 PM By: [name] EMT/HBOT Entered By: [name] on 08/17/2019 12:24:06 -------------------------------------------------------------------------------- Vitals Details Patient Name: Date of Service: [name]. 08/17/2019 10:00 AM Medical Record [id number] Patient Account Number: [id number] Date of Birth/Sex: Treating RN: 05-May-1946 (73 y.o. M) Primary Care Crescentia Boutwell: SYSTEM, PCP Other Clinician: [name] Referring Slyvester Latona: Treating Abrham Maslowski/Extender:Robson, [name] in Treatment: 9 Vital Signs Time Taken: 09:35 Temperature (F): 98.2 Height (in): 70 Pulse (bpm): 52 Weight (lbs): 152 Respiratory Rate (breaths/min): 15 Body Mass Index (BMI): 21.8 Blood Pressure (mmHg): 125/61 Reference Range: 80 - 120 mg / dl Electronic Signature(s) Signed: 08/17/2019 12:26:55 PM By: [name] EMT/HBOT Entered By: [name] on 08/17/2019 10:23:29

## 2019-08-17 NOTE — Progress Notes (Signed)
[name], [name] ([id number]) Visit Report for 08/17/2019 SuperBill Details Patient Name: Date of Service: [name], [name] 08/17/2019 Medical Record [id number] Patient Account Number: [id number] Date of Birth/Sex: Treating RN: [birth year] (73 y.o. 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

## 2019-08-18 DIAGNOSIS — C44329 Squamous cell carcinoma of skin of other parts of face: Secondary | ICD-10-CM | POA: Diagnosis not present

## 2019-08-18 DIAGNOSIS — R1312 Dysphagia, oropharyngeal phase: Secondary | ICD-10-CM | POA: Diagnosis not present

## 2019-08-18 DIAGNOSIS — R627 Adult failure to thrive: Secondary | ICD-10-CM | POA: Diagnosis not present

## 2019-08-21 ENCOUNTER — Encounter (HOSPITAL_BASED_OUTPATIENT_CLINIC_OR_DEPARTMENT_OTHER): Payer: Medicare HMO | Attending: Internal Medicine | Admitting: Internal Medicine

## 2019-08-21 ENCOUNTER — Other Ambulatory Visit: Payer: Self-pay

## 2019-08-21 DIAGNOSIS — C760 Malignant neoplasm of head, face and neck: Secondary | ICD-10-CM | POA: Insufficient documentation

## 2019-08-21 DIAGNOSIS — M272 Inflammatory conditions of jaws: Secondary | ICD-10-CM | POA: Diagnosis not present

## 2019-08-21 NOTE — Progress Notes (Signed)
APOLLOS, POVEROMO ([id number]) Visit Report for 08/21/2019 SuperBill Details Patient Name: Date of Service: [name], [name] 08/21/2019 Medical Record [id number] Patient Account Number: [id number] Date of Birth/Sex: Treating RN: 04-02-1946 (74 y.o. 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

## 2019-08-21 NOTE — Progress Notes (Addendum)
CANTRELL, [facility]SNER ([id number]) Visit Report for 08/21/2019 HBO Details Patient Name: Date of Service: [name], [name] 08/21/2019 1:00 PM Medical Record [id number] Patient Account Number: [id number] Date of Birth/Sex: Treating RN: 1946-04-17 (74 y.o. 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 HBO Treatment Details Treatment Number: 35 Patient Type: Outpatient Chamber Type: Monoplace Chamber Serial #: [id number] Treatment Protocol: 2.5 ATA with 90 minutes oxygen, with two 5 minute air breaks Treatment Details Compression Rate Down: 2.0 psi / minute De-Compression Rate Up: 2.0 psi / minute Air breaks and CompressTx Pressure breathing periods DecompressDecompress Begins Reached (leave unused spaces Begins Ends blank) Chamber Pressure (ATA)1 2.5 2.5 2.5 2.5 2.5 --2.5 1 Clock Time (24 hr) 13:15 13:27 13:5714:0214:3214:37--15:07 15:19 Treatment Length: 124 (minutes) Treatment Segments: 4 Vital Signs Capillary Blood Glucose Reference Range: 80 - 120 mg / dl HBO Diabetic Blood Glucose Intervention Range: <131 mg/dl or >213 mg/dl Time Vitals Blood Respiratory Capillary Blood Glucose Pulse Action Type: Pulse: Temperature: Taken: Pressure: Rate: Glucose (mg/dl): Meter #: Oximetry (%) Taken: Pre 12:55 123/68 58 14 98.2 Post 15:21 139/64 62 16 98.4 Treatment Response Treatment Toleration: Well Treatment Completion Treatment Completed without Adverse Event Status: [name] Notes No concerns with treatment given Physician HBO Attestation: I certify that I supervised this HBO treatment in accordance with Medicare guidelines. A trained Yes Yes emergency  response team is readily available per hospital policies and procedures. Continue HBOT as ordered. Yes Electronic Signature(s) Signed: 08/21/2019 4:56:38 PM By: Baltazar Najjar MD Previous Signature: 08/21/2019 4:12:00 PM Version By: Benjaman Kindler EMT/HBOT Entered By: Baltazar Najjar on 08/21/2019 16:55:49 -------------------------------------------------------------------------------- HBO Safety Checklist Details Patient Name: Date of Service: [name] [facility]. 08/21/2019 1:00 PM Medical Record [id number] Patient Account Number: [id number] Date of Birth/Sex: Treating RN: [birth year] (74 y.o. M) Primary Care Tasha Diaz: SYSTEM, PCP Other Clinician: Benjaman Kindler Referring Idan Prime: Treating Niaya Hickok/Extender:Robson, Lamar Sprinkles in Treatment: 10 HBO Safety Checklist Items Safety Checklist Consent Form Signed Patient voided / foley secured and emptied When did you last eato n/a Last dose of injectable or oral agent n/a NA Ostomy pouch emptied and vented if applicable NA All implantable devices assessed, documented and approved NA Intravenous access site secured and place Valuables secured Linens and cotton and cotton/polyester blend (less than 51% polyester) Personal oil-based products / skin lotions / body lotions removed NA Wigs or hairpieces removed NA Smoking or tobacco materials removed Books / newspapers / magazines / loose paper removed Cologne, aftershave, perfume and deodorant removed Jewelry removed (may wrap wedding band) NA Make-up removed Hair care products removed NA Battery operated devices (external) removed NA Heating patches and chemical warmers removed NA Titanium eyewear removed NA Nail polish cured greater than 10 hours NA Casting material cured greater than 10 hours NA Hearing aids removed NA Loose dentures or partials removed NA Prosthetics have been removed Patient demonstrates correct use of air break device (if applicable) Patient concerns have been  addressed Patient grounding bracelet on and cord attached to chamber Specifics for Inpatients (complete in addition to above) Medication sheet sent with patient Intravenous medications needed or due during therapy sent with patient Drainage tubes (e.g. nasogastric tube or chest tube secured and vented) Endotracheal or Tracheotomy tube secured Cuff deflated of air and inflated with saline Airway suctioned  Electronic Signature(s) Signed: 08/21/2019 1:22:09 PM By: Benjaman Kindler EMT/HBOT Entered By: Benjaman Kindler on 08/21/2019 13:22:07

## 2019-08-21 NOTE — Progress Notes (Signed)
TRACER, PALMS ([id number]) Visit Report for 08/21/2019 Arrival Information Details Patient Name: Date of Service: [name], [name] 08/21/2019 1:00 PM Medical Record [id number] Patient Account Number: [id number] Date of Birth/Sex: Treating RN: 1946-03-23 (74 y.o. M) Primary Care Luddie Boghosian: SYSTEM, PCP Other Clinician: [name] Referring Endi Lagman: Treating Jannell Franta/Extender:Robson, [name] in Treatment: 10 Visit Information History Since Last Visit Added or deleted any medications: No Patient Arrived: Ambulatory Any new allergies or adverse reactions: No Arrival Time: 12:50 Had a fall or experienced change in No Accompanied By: self activities of daily living that may affect Transfer Assistance: None risk of falls: Patient Identification Verified: Yes Signs or symptoms of abuse/neglect since last No Secondary Verification Process Yes visito Completed: Hospitalized since last visit: No Patient Requires Transmission-Based No Implantable device outside of the clinic excluding No Precautions: cellular tissue based products placed in the center Patient Has Alerts: No since last visit: Pain Present Now: No Electronic Signature(s) Signed: 08/21/2019 4:12:00 PM By: [name] EMT/HBOT Entered By: [name] on 08/21/2019 13:21:16 -------------------------------------------------------------------------------- Encounter Discharge Information Details Patient Name: Date of Service: [name] Dames. 08/21/2019 1:00 PM Medical Record [id number] Patient Account Number: [id number] Date of Birth/Sex: Treating RN: [birth year] (74 y.o. M) Primary Care Dietrich Ke: SYSTEM, PCP Other Clinician: [name] Referring Mishael Krysiak: Treating Aiva Miskell/Extender:Robson, [name] in Treatment: 10 Encounter Discharge Information Items Discharge Condition: Stable Ambulatory Status: Ambulatory Discharge Destination: Home Transportation: Private Auto Accompanied By:  self Schedule Follow-up Appointment: Yes Clinical Summary of Care: Patient Declined Electronic Signature(s) Signed: 08/21/2019 4:12:00 PM By: [name] EMT/HBOT Entered By: [name] on 08/21/2019 16:11:38 -------------------------------------------------------------------------------- Patient/Caregiver Education Details Patient Name: Date of Service: [name], [name] 1/4/2021andnbsp1:00 PM Medical Record [id number] Patient Account Number: [id number] Date of Birth/Gender: [birth year] (73 y.o. M) Treating RN: Primary Care Physician: SYSTEM, PCP Other Clinician: [name] Referring Physician: Treating Physician/Extender:Robson, [name] in Treatment: 10 Education Assessment Education Provided To: Patient Education Topics Provided Hyperbaric Oxygenation: Methods: Explain/Verbal Responses: State content correctly Electronic Signature(s) Signed: 08/21/2019 4:12:00 PM By: [name] EMT/HBOT Entered By: [name] on 08/21/2019 16:11:28 -------------------------------------------------------------------------------- Vitals Details Patient Name: Date of Service: [name] Dames. 08/21/2019 1:00 PM Medical Record [id number] Patient Account Number: [id number] Date of Birth/Sex: Treating RN: 17-Feb-1946 (74 y.o. M) Primary Care Lis Savitt: SYSTEM, PCP Other Clinician: [name] Referring Rose Hippler: Treating Libia Fazzini/Extender:Robson, [name] in Treatment: 10 Vital Signs Time Taken: 12:55 Temperature (F): 98.2 Height (in): 70 Pulse (bpm): 58 Weight (lbs): 152 Respiratory Rate (breaths/min): 14 Body Mass Index (BMI): 21.8 Blood Pressure (mmHg): 123/68 Reference Range: 80 - 120 mg / dl Electronic Signature(s) Signed: 08/21/2019 4:12:00 PM By: [name] EMT/HBOT Entered By: [name] on 08/21/2019 13:21:34

## 2019-08-22 ENCOUNTER — Encounter (HOSPITAL_BASED_OUTPATIENT_CLINIC_OR_DEPARTMENT_OTHER): Payer: Medicare HMO | Admitting: Internal Medicine

## 2019-08-22 DIAGNOSIS — M272 Inflammatory conditions of jaws: Secondary | ICD-10-CM | POA: Diagnosis not present

## 2019-08-22 DIAGNOSIS — C760 Malignant neoplasm of head, face and neck: Secondary | ICD-10-CM | POA: Diagnosis not present

## 2019-08-22 NOTE — Progress Notes (Addendum)
[name], [name] ([id number]) Visit Report for 08/22/2019 HBO Details Patient Name: Date of Service: [name], [name] 08/22/2019 1:00 PM Medical Record [id number] Patient Account Number: [id number] Date of Birth/Sex: Treating RN: 1946/03/16 (74 y.o. 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 HBO Treatment Details Treatment Number: 36 Patient Type: Outpatient Chamber Type: Monoplace Chamber Serial #: [id number] Treatment Protocol: 2.5 ATA with 90 minutes oxygen, with two 5 minute air breaks Treatment Details Compression Rate Down: 2.0 psi / minute De-Compression Rate Up: 2.0 psi / minute Air breaks and CompressTx Pressure breathing periods DecompressDecompress Begins Reached (leave unused spaces Begins Ends blank) Chamber Pressure (ATA)1 2.5 2.5 2.5 2.5 2.5 --2.5 1 Clock Time (24 hr) 13:09 13:21 13:5113:5614:2614:31--15:01 15:13 Treatment Length: 124 (minutes) Treatment Segments: 4 Vital Signs Capillary Blood Glucose Reference Range: 80 - 120 mg / dl HBO Diabetic Blood Glucose Intervention Range: <131 mg/dl or >469 mg/dl Time Vitals Blood Respiratory Capillary Blood Glucose Pulse Action Type: Pulse: Temperature: Taken: Pressure: Rate: Glucose (mg/dl): Meter #: Oximetry (%) Taken: Pre 13:00 133/90 56 18 98.2 Post 15:15 132/61 51 16 97.9 Treatment Response Treatment Toleration: Well Treatment Completion Treatment Completed without Adverse Event Status: Teshara Moree Notes No concerns with treatment given Physician HBO Attestation: I certify that I supervised this HBO treatment in accordance with Medicare guidelines. A trained Yes Yes emergency  response team is readily available per hospital policies and procedures. Continue HBOT as ordered. Yes Electronic Signature(s) Signed: 08/22/2019 5:30:44 PM By: Baltazar Najjar MD Previous Signature: 08/22/2019 3:29:37 PM Version By: Benjaman Kindler EMT/HBOT Entered By: Baltazar Najjar on 08/22/2019 17:29:14 -------------------------------------------------------------------------------- HBO Safety Checklist Details Patient Name: Date of Service: [name]. 08/22/2019 1:00 PM Medical Record [id number] Patient Account Number: [id number] Date of Birth/Sex: Treating RN: March 06, 1946 (74 y.o. M) Primary Care Shandiin Eisenbeis: SYSTEM, PCP Other Clinician: Benjaman Kindler Referring Lakena Sparlin: Treating Sandro Burgo/Extender:Robson, Lamar Sprinkles in Treatment: 10 HBO Safety Checklist Items Safety Checklist Consent Form Signed Patient voided / foley secured and emptied When did you last eato n/a Last dose of injectable or oral agent n/a NA Ostomy pouch emptied and vented if applicable NA All implantable devices assessed, documented and approved NA Intravenous access site secured and place Valuables secured Linens and cotton and cotton/polyester blend (less than 51% polyester) Personal oil-based products / skin lotions / body lotions removed NA Wigs or hairpieces removed NA Smoking or tobacco materials removed Books / newspapers / magazines / loose paper removed Cologne, aftershave, perfume and deodorant removed Jewelry removed (may wrap wedding band) NA Make-up removed Hair care products removed NA Battery operated devices (external) removed NA Heating patches and chemical warmers removed NA Titanium eyewear removed NA Nail polish cured greater than 10 hours NA Casting material cured greater than 10 hours NA Hearing aids removed NA Loose dentures or partials removed NA Prosthetics have been removed Patient demonstrates correct use of air break device (if applicable) Patient concerns have been  addressed Patient grounding bracelet on and cord attached to chamber Specifics for Inpatients (complete in addition to above) Medication sheet sent with patient Intravenous medications needed or due during therapy sent with patient Drainage tubes (e.g. nasogastric tube or chest tube secured and vented) Endotracheal or Tracheotomy tube secured Cuff deflated of air and inflated with saline Airway suctioned  Electronic Signature(s) Signed: 08/22/2019 1:14:42 PM By: Benjaman Kindler EMT/HBOT Entered By: Benjaman Kindler on 08/22/2019 13:14:41

## 2019-08-22 NOTE — Progress Notes (Signed)
[name], [name] ([id number]) Visit Report for 08/22/2019 SuperBill Details Patient Name: Date of Service: [name], [name] 08/22/2019 Medical Record [id number] Patient Account Number: [id number] Date of Birth/Sex: Treating RN: 10-Jul-1946 (74 y.o. 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

## 2019-08-22 NOTE — Progress Notes (Signed)
HASSANI, LAMBROU ([id number]) Visit Report for 08/22/2019 Arrival Information Details Patient Name: Date of Service: [name], [name] 08/22/2019 1:00 PM Medical Record [id number] Patient Account Number: [id number] Date of Birth/Sex: Treating RN: Jan 17, 1946 (74 y.o. M) Primary Care Coner Gibbard: SYSTEM, PCP Other Clinician: [name] Referring Suhana Wilner: Treating Trellis Vanoverbeke/Extender:Robson, [name] in Treatment: 10 Visit Information History Since Last Visit Added or deleted any medications: No Patient Arrived: Ambulatory Any new allergies or adverse reactions: No Arrival Time: 12:55 Had a fall or experienced change in No Accompanied By: self activities of daily living that may affect Transfer Assistance: None risk of falls: Patient Identification Verified: Yes Signs or symptoms of abuse/neglect since last No Secondary Verification Process Yes visito Completed: Hospitalized since last visit: No Patient Requires Transmission-Based No Implantable device outside of the clinic excluding No Precautions: cellular tissue based products placed in the center Patient Has Alerts: No since last visit: Pain Present Now: No Electronic Signature(s) Signed: 08/22/2019 3:29:37 PM By: [name] EMT/HBOT Entered By: [name] on 08/22/2019 13:13:57 -------------------------------------------------------------------------------- Encounter Discharge Information Details Patient Name: Date of Service: [name]. 08/22/2019 1:00 PM Medical Record [id number] Patient Account Number: [id number] Date of Birth/Sex: Treating RN: 09/16/1945 (74 y.o. M) Primary Care Inocencia Murtaugh: SYSTEM, PCP Other Clinician: [name] Referring Porsha Skilton: Treating Rayelynn Loyal/Extender:Robson, [name] in Treatment: 10 Encounter Discharge Information Items Discharge Condition: Stable Ambulatory Status: Ambulatory Discharge Destination: Home Transportation: Private Auto Accompanied By:  self Schedule Follow-up Appointment: Yes Clinical Summary of Care: Patient Declined Electronic Signature(s) Signed: 08/22/2019 3:29:37 PM By: [name] EMT/HBOT Entered By: [name] on 08/22/2019 15:28:59 -------------------------------------------------------------------------------- Patient/Caregiver Education Details Patient Name: Date of Service: [name], [name] 1/5/2021andnbsp1:00 PM Medical Record [id number] Patient Account Number: [id number] Date of Birth/Gender: [birth year] (74 y.o. M) Treating RN: Primary Care Physician: SYSTEM, PCP Other Clinician: [name] Referring Physician: Treating Physician/Extender:Robson, [name] in Treatment: 10 Education Assessment Education Provided To: Patient Education Topics Provided Hyperbaric Oxygenation: Methods: Explain/Verbal Responses: State content correctly Electronic Signature(s) Signed: 08/22/2019 3:29:37 PM By: [name] EMT/HBOT Entered By: [name] on 08/22/2019 15:28:46 -------------------------------------------------------------------------------- Vitals Details Patient Name: Date of Service: [name]. 08/22/2019 1:00 PM Medical Record [id number] Patient Account Number: [id number] Date of Birth/Sex: Treating RN: 26-Sep-1945 (74 y.o. M) Primary Care Gadiel John: SYSTEM, PCP Other Clinician: [name] Referring Matteo Banke: Treating Braylynn Lewing/Extender:Robson, [name] in Treatment: 10 Vital Signs Time Taken: 13:00 Temperature (F): 98.2 Height (in): 70 Pulse (bpm): 56 Weight (lbs): 152 Respiratory Rate (breaths/min): 18 Body Mass Index (BMI): 21.8 Blood Pressure (mmHg): 133/90 Reference Range: 80 - 120 mg / dl Electronic Signature(s) Signed: 08/22/2019 3:29:37 PM By: [name] EMT/HBOT Entered By: [name] on 08/22/2019 13:14:10

## 2019-08-23 ENCOUNTER — Encounter (HOSPITAL_BASED_OUTPATIENT_CLINIC_OR_DEPARTMENT_OTHER): Payer: Medicare HMO | Admitting: Physician Assistant

## 2019-08-23 ENCOUNTER — Other Ambulatory Visit: Payer: Self-pay

## 2019-08-23 DIAGNOSIS — M272 Inflammatory conditions of jaws: Secondary | ICD-10-CM | POA: Diagnosis not present

## 2019-08-23 DIAGNOSIS — C760 Malignant neoplasm of head, face and neck: Secondary | ICD-10-CM | POA: Diagnosis not present

## 2019-08-23 NOTE — Progress Notes (Signed)
[name], [name] ([id number]) Visit Report for 08/23/2019 Arrival Information Details Patient Name: Date of Service: [name], [name] 08/23/2019 1:00 PM Medical Record [id number] Patient Account Number: [id number] Date of Birth/Sex: Treating RN: Mar 17, 1946 (74 y.o. M) Primary Care Timonthy Hovater: SYSTEM, PCP Other Clinician: [name] Referring Lisel Siegrist: Treating Briellah Baik/Extender:[name], [name] in Treatment: 10 Visit Information History Since Last Visit Added or deleted any medications: No Patient Arrived: Ambulatory Any new allergies or adverse reactions: No Arrival Time: 13:00 Had a fall or experienced change in No Accompanied By: self activities of daily living that may affect Transfer Assistance: None risk of falls: Patient Identification Verified: Yes Signs or symptoms of abuse/neglect since last No Secondary Verification Process Yes visito Completed: Hospitalized since last visit: No Patient Requires Transmission-Based No Implantable device outside of the clinic excluding No Precautions: cellular tissue based products placed in the center Patient Has Alerts: No since last visit: Pain Present Now: No Electronic Signature(s) Signed: 08/23/2019 3:43:47 PM By: [name] EMT/HBOT Entered By: [name] on 08/23/2019 13:51:34 -------------------------------------------------------------------------------- Encounter Discharge Information Details Patient Name: Date of Service: [name]. 08/23/2019 1:00 PM Medical Record [id number] Patient Account Number: [id number] Date of Birth/Sex: Treating RN: 01/12/1946 (74 y.o. M) Primary Care Satine Hausner: SYSTEM, PCP Other Clinician: [name] Referring Teddie Mehta: Treating Rockell Faulks/Extender:[name], [name] in Treatment: 10 Encounter Discharge Information Items Discharge Condition: Stable Ambulatory Status: Ambulatory Discharge Destination: Home Transportation: Private Auto Accompanied By:  self Schedule Follow-up Appointment: Yes Clinical Summary of Care: Patient Declined Electronic Signature(s) Signed: 08/23/2019 3:43:47 PM By: [name] EMT/HBOT Entered By: [name] on 08/23/2019 15:43:23 -------------------------------------------------------------------------------- Patient/Caregiver Education Details Patient Name: Date of Service: [name], [name] 1/6/2021andnbsp1:00 PM Medical Record [id number] Patient Account Number: [id number] Date of Birth/Gender: Aug 21, 1945 (73 y.o. M) Treating RN: Primary Care Physician: SYSTEM, PCP Other Clinician: [name] Referring Physician: Treating Physician/Extender:[name], [name] in Treatment: 10 Education Assessment Education Provided To: Patient Education Topics Provided Hyperbaric Oxygenation: Methods: Explain/Verbal Responses: State content correctly Electronic Signature(s) Signed: 08/23/2019 3:43:47 PM By: [name] EMT/HBOT Entered By: [name] on 08/23/2019 15:43:13 -------------------------------------------------------------------------------- Vitals Details Patient Name: Date of Service: [name]. 08/23/2019 1:00 PM Medical Record [id number] Patient Account Number: [id number] Date of Birth/Sex: Treating RN: July 31, 1946 (74 y.o. M) Primary Care Korena Nass: SYSTEM, PCP Other Clinician: [name] Referring Vernetta Dizdarevic: Treating Rashaunda Rahl/Extender:[name], [name] in Treatment: 10 Vital Signs Time Taken: 13:05 Temperature (F): 97.5 Height (in): 70 Pulse (bpm): 58 Weight (lbs): 152 Respiratory Rate (breaths/min): 15 Body Mass Index (BMI): 21.8 Blood Pressure (mmHg): 137/65 Reference Range: 80 - 120 mg / dl Electronic Signature(s) Signed: 08/23/2019 3:43:47 PM By: [name] EMT/HBOT Entered By: [name] on 08/23/2019 13:51:50

## 2019-08-23 NOTE — Progress Notes (Addendum)
MERVIN, NITZSCHE ([id number]) Visit Report for 08/23/2019 HBO Details Patient Name: Date of Service: GIFFORD, ENGE 08/23/2019 1:00 PM Medical Record [id number] Patient Account Number: [id number] Date of Birth/Sex: Treating RN: 24-May-1946 (74 y.o. 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 HBO Treatment Details Treatment Number: 37 Patient Type: Outpatient Chamber Type: Monoplace Chamber Serial #: [id number] Treatment Protocol: 2.5 ATA with 90 minutes oxygen, with two 5 minute air breaks Treatment Details Compression Rate Down: 2.0 psi / minute De-Compression Rate Up: 2.0 psi / minute Air breaks and CompressTx Pressure breathing periods DecompressDecompress Begins Reached (leave unused spaces Begins Ends blank) Chamber Pressure (ATA)1 2.5 2.5 2.5 2.5 2.5 --2.5 1 Clock Time (24 hr) 12:58 13:10 13:4013:4514:1514:20--14:50 15:02 Treatment Length: 124 (minutes) Treatment Segments: 4 Vital Signs Capillary Blood Glucose Reference Range: 80 - 120 mg / dl HBO Diabetic Blood Glucose Intervention Range: <131 mg/dl or >213 mg/dl Time Vitals Blood Respiratory Capillary Blood Glucose Pulse Action Type: Pulse: Temperature: Taken: Pressure: Rate: Glucose (mg/dl): Meter #: Oximetry (%) Taken: Pre 13:05 137/65 58 15 97.5 Post 15:05 139/60 62 13 98 Treatment Response Treatment Toleration: Well Treatment Completion Treatment Completed without Adverse Event Status: Physician HBO Attestation: I certify that I supervised this HBO treatment in accordance with Medicare guidelines. A trained Yes emergency response team is readily available per hospital policies and  procedures. Continue HBOT as ordered. Yes Electronic Signature(s) Signed: 08/28/2019 4:28:22 PM By: Lenda Kelp PA-C Previous Signature: 08/23/2019 3:43:47 PM Version By: Benjaman Kindler EMT/HBOT Entered By: Lenda Kelp on 08/28/2019 08:65:78 -------------------------------------------------------------------------------- HBO Safety Checklist Details Patient Name: Date of Service: [name] 08/23/2019 1:00 PM Medical Record [id number] Patient Account Number: [id number] Date of Birth/Sex: Treating RN: Nov 04, 1945 (74 y.o. M) Primary Care Rudell Marlowe: SYSTEM, PCP Other Clinician: Benjaman Kindler Referring Jakaree Pickard: Treating Herschell Virani/Extender:[name], Jake Samples in Treatment: 10 HBO Safety Checklist Items Safety Checklist Consent Form Signed Patient voided / foley secured and emptied When did you last eato n/a Last dose of injectable or oral agent n/a NA Ostomy pouch emptied and vented if applicable NA All implantable devices assessed, documented and approved NA Intravenous access site secured and place Valuables secured Linens and cotton and cotton/polyester blend (less than 51% polyester) Personal oil-based products / skin lotions / body lotions removed NA Wigs or hairpieces removed NA Smoking or tobacco materials removed Books / newspapers / magazines / loose paper removed Cologne, aftershave, perfume and deodorant removed Jewelry removed (may wrap wedding band) NA Make-up removed Hair care products removed NA Battery operated devices (external) removed NA Heating patches and chemical warmers removed NA Titanium eyewear removed NA Nail polish cured greater than 10 hours NA Casting material cured greater than 10 hours NA Hearing aids removed NA Loose dentures or partials removed NA Prosthetics have been removed Patient demonstrates correct use of air break device (if applicable) Patient concerns have been addressed Patient grounding bracelet on and cord attached  to chamber Specifics for Inpatients (complete in addition to above) Medication sheet sent with patient Intravenous medications needed or due during therapy sent with patient Drainage tubes (e.g. nasogastric tube or chest tube secured and vented) Endotracheal or Tracheotomy tube secured Cuff deflated of air and inflated with saline Airway suctioned Electronic Signature(s) Signed: 08/23/2019  1:52:34 PM By: Benjaman Kindler EMT/HBOT Entered By: Benjaman Kindler on 08/23/2019 13:52:33

## 2019-08-24 ENCOUNTER — Encounter (HOSPITAL_BASED_OUTPATIENT_CLINIC_OR_DEPARTMENT_OTHER): Payer: Medicare HMO | Admitting: Internal Medicine

## 2019-08-24 ENCOUNTER — Other Ambulatory Visit: Payer: Self-pay

## 2019-08-24 DIAGNOSIS — M272 Inflammatory conditions of jaws: Secondary | ICD-10-CM | POA: Diagnosis not present

## 2019-08-24 DIAGNOSIS — C760 Malignant neoplasm of head, face and neck: Secondary | ICD-10-CM | POA: Diagnosis not present

## 2019-08-24 NOTE — Progress Notes (Signed)
RAVON, TSOU ([id number]) Visit Report for 08/24/2019 Arrival Information Details Patient Name: Date of Service: [name], [name] 08/24/2019 1:00 PM Medical Record [id number] Patient Account Number: [id number] Date of Birth/Sex: Treating RN: 1946/01/26 (74 y.o. M) Primary Care AmeLie Hollars: SYSTEM, PCP Other Clinician: [name] Referring Andrya Roppolo: Treating Cyrstal Leitz/Extender:Robson, [name] in Treatment: 10 Visit Information History Since Last Visit Added or deleted any medications: No Patient Arrived: Ambulatory Any new allergies or adverse reactions: No Arrival Time: 12:55 Had a fall or experienced change in No Accompanied By: self activities of daily living that may affect Transfer Assistance: None risk of falls: Patient Identification Verified: Yes Signs or symptoms of abuse/neglect since last No Secondary Verification Process Yes visito Completed: Hospitalized since last visit: No Patient Requires Transmission-Based No Implantable device outside of the clinic excluding No Precautions: cellular tissue based products placed in the center Patient Has Alerts: No since last visit: Pain Present Now: No Electronic Signature(s) Signed: 08/24/2019 4:21:35 PM By: [name] EMT/HBOT Entered By: [name] on 08/24/2019 13:12:32 -------------------------------------------------------------------------------- Encounter Discharge Information Details Patient Name: Date of Service: [name] Dames. 08/24/2019 1:00 PM Medical Record [id number] Patient Account Number: [id number] Date of Birth/Sex: Treating RN: 1945/12/29 (74 y.o. M) Primary Care Meshilem Machuca: SYSTEM, PCP Other Clinician: [name] Referring Tasnia Spegal: Treating Karlissa Aron/Extender:Robson, [name] in Treatment: 10 Encounter Discharge Information Items Discharge Condition: Stable Ambulatory Status: Ambulatory Discharge Destination: Home Transportation: Private Auto Accompanied By:  self Schedule Follow-up Appointment: Yes Clinical Summary of Care: Patient Declined Electronic Signature(s) Signed: 08/24/2019 4:21:35 PM By: [name] EMT/HBOT Entered By: [name] on 08/24/2019 16:21:04 -------------------------------------------------------------------------------- Patient/Caregiver Education Details Patient Name: Date of Service: [name], [name] 1/7/2021andnbsp1:00 PM Medical Record [id number] Patient Account Number: [id number] Date of Birth/Gender: 29-Nov-1945 (73 y.o. M) Treating RN: Primary Care Physician: SYSTEM, PCP Other Clinician: Referring Physician: Treating Physician/Extender:Robson, [name] in Treatment: 10 Education Assessment Education Provided To: Patient Education Topics Provided Hyperbaric Oxygenation: Methods: Explain/Verbal Responses: State content correctly Electronic Signature(s) Signed: 08/24/2019 4:21:35 PM By: [name] EMT/HBOT Entered By: [name] on 08/24/2019 16:20:53 -------------------------------------------------------------------------------- Vitals Details Patient Name: Date of Service: [name] Dames. 08/24/2019 1:00 PM Medical Record [id number] Patient Account Number: [id number] Date of Birth/Sex: Treating RN: 06/13/1946 (74 y.o. M) Primary Care Devlon Dosher: SYSTEM, PCP Other Clinician: [name] Referring Saphyre Cillo: Treating Kaydince Towles/Extender:Robson, [name] in Treatment: 10 Vital Signs Time Taken: 13:00 Temperature (F): 97.5 Height (in): 70 Pulse (bpm): 52 Weight (lbs): 152 Respiratory Rate (breaths/min): 14 Body Mass Index (BMI): 21.8 Blood Pressure (mmHg): 127/73 Reference Range: 80 - 120 mg / dl Electronic Signature(s) Signed: 08/24/2019 4:21:35 PM By: [name] EMT/HBOT Entered By: [name] on 08/24/2019 13:12:46

## 2019-08-24 NOTE — Progress Notes (Addendum)
[name] ([id number]) Visit Report for 08/24/2019 HBO Details Patient Name: Date of Service: [name], [name] 08/24/2019 1:00 PM Medical Record [id number] Patient Account Number: [id number] Date of Birth/Sex: Treating RN: [birth year] (74 y.o. 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 HBO Treatment Details Treatment Number: 38 Patient Type: Outpatient Chamber Type: Monoplace Chamber Serial #: [id number] Treatment Protocol: 2.5 ATA with 90 minutes oxygen, with two 5 minute air breaks Treatment Details Compression Rate Down: 2.0 psi / minute De-Compression Rate Up: 2.0 psi / minute Air breaks and CompressTx Pressure breathing periods DecompressDecompress Begins Reached (leave unused spaces Begins Ends blank) Chamber Pressure (ATA)1 2.5 2.5 2.5 2.5 2.5 --2.5 1 Clock Time (24 hr) 13:08 13:20 13:5013:5514:2514:30--15:00 15:12 Treatment Length: 124 (minutes) Treatment Segments: 4 Vital Signs Capillary Blood Glucose Reference Range: 80 - 120 mg / dl HBO Diabetic Blood Glucose Intervention Range: <131 mg/dl or >259 mg/dl Time Vitals Blood Respiratory Capillary Blood Glucose Pulse Action Type: Pulse: Temperature: Taken: Pressure: Rate: Glucose (mg/dl): Meter #: Oximetry (%) Taken: Pre 13:00 127/73 52 14 97.5 Post 15:15 138/71 52 15 98 Treatment Response Treatment Toleration: Well Treatment Completion Treatment Completed without Adverse Event Status: Nada Godley Notes No concerns with treatment given Physician HBO Attestation: I certify that I supervised this HBO treatment in accordance with Medicare guidelines. A trained Yes Yes emergency  response team is readily available per hospital policies and procedures. Continue HBOT as ordered. Yes Electronic Signature(s) Signed: 08/25/2019 4:55:01 AM By: Baltazar Najjar MD Previous Signature: 08/24/2019 4:21:35 PM Version By: Benjaman Kindler EMT/HBOT Entered By: Baltazar Najjar on 08/24/2019 17:08:31 -------------------------------------------------------------------------------- HBO Safety Checklist Details Patient Name: Date of Service: Kennith [name]. 08/24/2019 1:00 PM Medical Record [id number] Patient Account Number: [id number] Date of Birth/Sex: Treating RN: 1946/04/21 (74 y.o. M) Primary Care Rhylen Shaheen: SYSTEM, PCP Other Clinician: Benjaman Kindler Referring Imari Sivertsen: Treating Arlind Klingerman/Extender:Robson, Lamar Sprinkles in Treatment: 10 HBO Safety Checklist Items Safety Checklist Consent Form Signed Patient voided / foley secured and emptied When did you last eato n/a Last dose of injectable or oral agent n/a NA Ostomy pouch emptied and vented if applicable NA All implantable devices assessed, documented and approved NA Intravenous access site secured and place Valuables secured Linens and cotton and cotton/polyester blend (less than 51% polyester) Personal oil-based products / skin lotions / body lotions removed NA Wigs or hairpieces removed NA Smoking or tobacco materials removed Books / newspapers / magazines / loose paper removed Cologne, aftershave, perfume and deodorant removed Jewelry removed (may wrap wedding band) NA Make-up removed Hair care products removed NA Battery operated devices (external) removed NA Heating patches and chemical warmers removed NA Titanium eyewear removed NA Nail polish cured greater than 10 hours NA Casting material cured greater than 10 hours NA Hearing aids removed NA Loose dentures or partials removed NA Prosthetics have been removed Patient demonstrates correct use of air break device (if applicable) Patient concerns have been  addressed Patient grounding bracelet on and cord attached to chamber Specifics for Inpatients (complete in addition to above) Medication sheet sent with patient Intravenous medications needed or due during therapy sent with patient Drainage tubes (e.g. nasogastric tube or chest tube secured and vented) Endotracheal or Tracheotomy tube secured Cuff deflated of air and inflated with saline Airway suctioned  Electronic Signature(s) Signed: 08/24/2019 1:13:54 PM By: Benjaman Kindler EMT/HBOT Entered By: Benjaman Kindler on 08/24/2019 13:13:53

## 2019-08-25 ENCOUNTER — Encounter (HOSPITAL_BASED_OUTPATIENT_CLINIC_OR_DEPARTMENT_OTHER): Payer: Medicare HMO | Admitting: Internal Medicine

## 2019-08-25 DIAGNOSIS — M272 Inflammatory conditions of jaws: Secondary | ICD-10-CM | POA: Diagnosis not present

## 2019-08-25 DIAGNOSIS — C760 Malignant neoplasm of head, face and neck: Secondary | ICD-10-CM | POA: Diagnosis not present

## 2019-08-25 NOTE — Progress Notes (Addendum)
OMARRI, NGAN ([id number]) Visit Report for 08/25/2019 HBO Details Patient Name: Date of Service: EMMETT, SCHURTZ 08/25/2019 1:00 PM Medical Record [id number] Patient Account Number: [id number] Date of Birth/Sex: Treating RN: [birth year] (74 y.o. 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 HBO Treatment Details Treatment Number: 39 Patient Type: Outpatient Chamber Type: Monoplace Chamber Serial #: [id number] Treatment Protocol: 2.5 ATA with 90 minutes oxygen, with two 5 minute air breaks Treatment Details Compression Rate Down: 2.0 psi / minute De-Compression Rate Up: 2.0 psi / minute Air breaks and CompressTx Pressure breathing periods DecompressDecompress Begins Reached (leave unused spaces Begins Ends blank) Chamber Pressure (ATA)1 2.5 2.5 2.5 2.5 2.5 --2.5 1 Clock Time (24 hr) 13:03 13:15 13:4513:5014:2014:25--14:55 15:07 Treatment Length: 124 (minutes) Treatment Segments: 4 Vital Signs Capillary Blood Glucose Reference Range: 80 - 120 mg / dl HBO Diabetic Blood Glucose Intervention Range: <131 mg/dl or >295 mg/dl Time Vitals Blood Respiratory Capillary Blood Glucose Pulse Action Type: Pulse: Temperature: Taken: Pressure: Rate: Glucose (mg/dl): Meter #: Oximetry (%) Taken: Pre 12:55 141/83 59 16 97.4 Post 15:10 139/78 50 14 97.9 Treatment Response Treatment Toleration: Well Treatment Completion Treatment Completed without Adverse Event Status: Shalaina Guardiola Notes No concerns with treatment given Physician HBO Attestation: I certify that I supervised this HBO treatment in accordance with Medicare guidelines. A trained Yes Yes emergency  response team is readily available per hospital policies and procedures. Continue HBOT as ordered. Yes Electronic Signature(s) Signed: 08/25/2019 4:35:49 PM By: Baltazar Najjar MD Previous Signature: 08/25/2019 3:20:21 PM Version By: Benjaman Kindler EMT/HBOT Entered By: Baltazar Najjar on 08/25/2019 15:49:54 -------------------------------------------------------------------------------- HBO Safety Checklist Details Patient Name: Date of Service: [name] 08/25/2019 1:00 PM Medical Record [id number] Patient Account Number: [id number] Date of Birth/Sex: Treating RN: 1946-06-16 (74 y.o. M) Primary Care Eljay Lave: SYSTEM, PCP Other Clinician: Benjaman Kindler Referring Vinisha Faxon: Treating Gracey Tolle/Extender:Robson, Lamar Sprinkles in Treatment: 10 HBO Safety Checklist Items Safety Checklist Consent Form Signed Patient voided / foley secured and emptied When did you last eato n/a Last dose of injectable or oral agent n/a NA Ostomy pouch emptied and vented if applicable NA All implantable devices assessed, documented and approved NA Intravenous access site secured and place Valuables secured Linens and cotton and cotton/polyester blend (less than 51% polyester) Personal oil-based products / skin lotions / body lotions removed NA Wigs or hairpieces removed NA Smoking or tobacco materials removed Books / newspapers / magazines / loose paper removed Cologne, aftershave, perfume and deodorant removed Jewelry removed (may wrap wedding band) NA Make-up removed Hair care products removed NA Battery operated devices (external) removed NA Heating patches and chemical warmers removed NA Titanium eyewear removed NA Nail polish cured greater than 10 hours NA Casting material cured greater than 10 hours NA Hearing aids removed NA Loose dentures or partials removed NA Prosthetics have been removed Patient demonstrates correct use of air break device (if applicable) Patient concerns have been  addressed Patient grounding bracelet on and cord attached to chamber Specifics for Inpatients (complete in addition to above) Medication sheet sent with patient Intravenous medications needed or due during therapy sent with patient Drainage tubes (e.g. nasogastric tube or chest tube secured and vented) Endotracheal or Tracheotomy tube secured Cuff deflated of air and inflated with saline Airway suctioned  Electronic Signature(s) Signed: 08/25/2019 1:21:47 PM By: Benjaman Kindler EMT/HBOT Entered By: Benjaman Kindler on 08/25/2019 13:21:47

## 2019-08-25 NOTE — Progress Notes (Signed)
[name], [name] ([id number]) Visit Report for 08/24/2019 SuperBill Details Patient Name: Date of Service: [name], [name] 08/24/2019 Medical Record [id number] Patient Account Number: [id number] Date of Birth/Sex: Treating RN: Jun 08, 1946 (74 y.o. 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

## 2019-08-25 NOTE — Progress Notes (Signed)
[name], [name] ([id number]) Visit Report for 08/25/2019 SuperBill Details Patient Name: Date of Service: [name], [name] 08/25/2019 Medical Record [id number] Patient Account Number: [id number] Date of Birth/Sex: Treating RN: 01-05-1946 (74 y.o. 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

## 2019-08-25 NOTE — Progress Notes (Signed)
ZACHARI, FAROOQI ([id number]) Visit Report for 08/25/2019 Arrival Information Details Patient Name: Date of Service: [name], [name] 08/25/2019 1:00 PM Medical Record [id number] Patient Account Number: [id number] Date of Birth/Sex: Treating RN: 1946-02-10 (74 y.o. M) Primary Care Shaheen Mende: SYSTEM, PCP Other Clinician: [name] Referring Jawara Latorre: Treating Baby Stairs/Extender:Robson, [name] in Treatment: 10 Visit Information History Since Last Visit Added or deleted any medications: No Patient Arrived: Ambulatory Any new allergies or adverse reactions: No Arrival Time: 12:50 Had a fall or experienced change in No Accompanied By: self activities of daily living that may affect Transfer Assistance: None risk of falls: Patient Identification Verified: Yes Signs or symptoms of abuse/neglect since last No Secondary Verification Process Yes visito Completed: Hospitalized since last visit: No Patient Requires Transmission-Based No Implantable device outside of the clinic excluding No Precautions: cellular tissue based products placed in the center Patient Has Alerts: No since last visit: Pain Present Now: No Electronic Signature(s) Signed: 08/25/2019 3:20:21 PM By: [name] EMT/HBOT Entered By: [name] on 08/25/2019 13:20:53 -------------------------------------------------------------------------------- Encounter Discharge Information Details Patient Name: Date of Service: [name] Dames. 08/25/2019 1:00 PM Medical Record [id number] Patient Account Number: [id number] Date of Birth/Sex: Treating RN: November 25, 1945 (74 y.o. M) Primary Care Dominico Rod: SYSTEM, PCP Other Clinician: [name] Referring Jenavive Lamboy: Treating Demitrious Mccannon/Extender:Robson, [name] in Treatment: 10 Encounter Discharge Information Items Discharge Condition: Stable Ambulatory Status: Ambulatory Discharge Destination: Home Transportation: Private Auto Accompanied By:  self Schedule Follow-up Appointment: Yes Clinical Summary of Care: Patient Declined Electronic Signature(s) Signed: 08/25/2019 3:20:21 PM By: [name] EMT/HBOT Entered By: [name] on 08/25/2019 15:19:53 -------------------------------------------------------------------------------- Patient/Caregiver Education Details Patient Name: Date of Service: [name], [name] 1/8/2021andnbsp1:00 PM Medical Record [id number] Patient Account Number: [id number] Date of Birth/Gender: [birth year] (73 y.o. M) Treating RN: Primary Care Physician: SYSTEM, PCP Other Clinician: [name] Referring Physician: Treating Physician/Extender:Robson, [name] in Treatment: 10 Education Assessment Education Provided To: Patient Education Topics Provided Hyperbaric Oxygenation: Methods: Explain/Verbal Responses: State content correctly Electronic Signature(s) Signed: 08/25/2019 3:20:21 PM By: [name] EMT/HBOT Entered By: [name] on 08/25/2019 15:19:41 -------------------------------------------------------------------------------- Vitals Details Patient Name: Date of Service: [name] Dames. 08/25/2019 1:00 PM Medical Record [id number] Patient Account Number: [id number] Date of Birth/Sex: Treating RN: 1946-07-16 (74 y.o. M) Primary Care Nameer Summer: SYSTEM, PCP Other Clinician: [name] Referring Shanta Dorvil: Treating Kelisha Dall/Extender:Robson, [name] in Treatment: 10 Vital Signs Time Taken: 12:55 Temperature (F): 97.4 Height (in): 70 Pulse (bpm): 59 Weight (lbs): 152 Respiratory Rate (breaths/min): 16 Body Mass Index (BMI): 21.8 Blood Pressure (mmHg): 141/83 Reference Range: 80 - 120 mg / dl Electronic Signature(s) Signed: 08/25/2019 3:20:21 PM By: [name] EMT/HBOT Entered By: [name] on 08/25/2019 13:21:10

## 2019-08-28 ENCOUNTER — Encounter (HOSPITAL_BASED_OUTPATIENT_CLINIC_OR_DEPARTMENT_OTHER): Payer: Medicare HMO | Admitting: Internal Medicine

## 2019-08-28 ENCOUNTER — Other Ambulatory Visit: Payer: Self-pay

## 2019-08-28 DIAGNOSIS — C760 Malignant neoplasm of head, face and neck: Secondary | ICD-10-CM | POA: Diagnosis not present

## 2019-08-28 DIAGNOSIS — M272 Inflammatory conditions of jaws: Secondary | ICD-10-CM | POA: Diagnosis not present

## 2019-08-28 NOTE — Progress Notes (Signed)
[name], [name] ([id number]) Visit Report for 08/28/2019 Arrival Information Details Patient Name: Date of Service: [name], [name] 08/28/2019 1:00 PM Medical Record [id number] Patient Account Number: [id number] Date of Birth/Sex: Treating RN: 08/22/1945 (74 y.o. M) Primary Care Bertine Schlottman: SYSTEM, PCP Other Clinician: [name] Referring Homar Weinkauf: Treating Santonio Speakman/Extender:Robson, [name] in Treatment: 11 Visit Information History Since Last Visit Added or deleted any medications: No Patient Arrived: Ambulatory Any new allergies or adverse reactions: No Arrival Time: 13:00 Had a fall or experienced change in No Accompanied By: self activities of daily living that may affect Transfer Assistance: None risk of falls: Patient Identification Verified: Yes Signs or symptoms of abuse/neglect since last No Secondary Verification Process Yes visito Completed: Hospitalized since last visit: No Patient Requires Transmission-Based No Implantable device outside of the clinic excluding No Precautions: cellular tissue based products placed in the center Patient Has Alerts: No since last visit: Pain Present Now: No Electronic Signature(s) Signed: 08/28/2019 4:37:37 PM By: [name] EMT/HBOT Entered By: [name] on 08/28/2019 13:31:41 -------------------------------------------------------------------------------- Encounter Discharge Information Details Patient Name: Date of Service: [name] Dames. 08/28/2019 1:00 PM Medical Record [id number] Patient Account Number: [id number] Date of Birth/Sex: Treating RN: 11/01/1945 (74 y.o. M) Primary Care Sebron Mcmahill: SYSTEM, PCP Other Clinician: [name] Referring Ximena Todaro: Treating Adrienne Trombetta/Extender:Robson, [name] in Treatment: 11 Encounter Discharge Information Items Discharge Condition: Stable Ambulatory Status: Ambulatory Discharge Destination: Home Transportation: Private Auto Accompanied By:  self Schedule Follow-up Appointment: Yes Clinical Summary of Care: Patient Declined Electronic Signature(s) Signed: 08/28/2019 4:37:37 PM By: [name] EMT/HBOT Entered By: [name] on 08/28/2019 16:37:01 -------------------------------------------------------------------------------- Patient/Caregiver Education Details Patient Name: Date of Service: [name], [name] 1/11/2021andnbsp1:00 PM Medical Record [id number] Patient Account Number: [id number] Date of Birth/Gender: 03-10-1946 (74 y.o. M) Treating RN: Primary Care Physician: SYSTEM, PCP Other Clinician: [name] Referring Physician: Treating Physician/Extender:Robson, [name] in Treatment: 11 Education Assessment Education Provided To: Patient Education Topics Provided Hyperbaric Oxygenation: Methods: Explain/Verbal Responses: State content correctly Electronic Signature(s) Signed: 08/28/2019 4:37:37 PM By: [name] EMT/HBOT Entered By: [name] on 08/28/2019 16:36:48 -------------------------------------------------------------------------------- Vitals Details Patient Name: Date of Service: [name] Dames. 08/28/2019 1:00 PM Medical Record [id number] Patient Account Number: [id number] Date of Birth/Sex: Treating RN: 12/16/1945 (74 y.o. M) Primary Care Liala Codispoti: SYSTEM, PCP Other Clinician: [name] Referring Margarit Minshall: Treating Lakeasha Petion/Extender:Robson, [name] in Treatment: 11 Vital Signs Time Taken: 13:05 Temperature (F): 97.6 Height (in): 70 Pulse (bpm): 53 Weight (lbs): 152 Respiratory Rate (breaths/min): 15 Body Mass Index (BMI): 21.8 Blood Pressure (mmHg): 130/62 Reference Range: 80 - 120 mg / dl Electronic Signature(s) Signed: 08/28/2019 4:37:37 PM By: [name] EMT/HBOT Entered By: [name] on 08/28/2019 13:31:55

## 2019-08-28 NOTE — Progress Notes (Signed)
[name], [name] ([id number]) Visit Report for 08/23/2019 Problem List Details Patient Name: Date of Service: [name], [name] 08/23/2019 1:00 PM Medical Record [id number] Patient Account Number: [id number] Date of Birth/Sex: Treating RN: 25-Jun-1946 (74 y.o. 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

## 2019-08-28 NOTE — Progress Notes (Signed)
[name], [name] ([id number]) Visit Report for 08/28/2019 SuperBill Details Patient Name: Date of Service: [name], [name] 08/28/2019 Medical Record [id number] Patient Account Number: [id number] Date of Birth/Sex: Treating RN: 1946-03-19 (74 y.o. 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

## 2019-08-28 NOTE — Progress Notes (Addendum)
[name], [name] ([id number]) Visit Report for 08/28/2019 HBO Details Patient Name: Date of Service: [name], [name] 08/28/2019 1:00 PM Medical Record [id number] Patient Account Number: [id number] Date of Birth/Sex: Treating RN: Sep 25, 1945 (74 y.o. 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 HBO Treatment 08/28/2019 End Date: HBO Discharge Treatment Series Complete; Outcome: STRN/ORN Protocol Goals Achieved HBO Treatment Details Treatment Number: 40 Patient Type: Outpatient Chamber Type: Monoplace Chamber Serial #: [id number] Treatment Protocol: 2.5 ATA with 90 minutes oxygen, with two 5 minute air breaks Treatment Details Compression Rate Down: 2.0 psi / minute De-Compression Rate Up: 2.0 psi / minute Air breaks and CompressTx Pressure breathing periods DecompressDecompress Begins Reached (leave unused spaces Begins Ends blank) Chamber Pressure (ATA)1 2.5 2.5 2.5 2.5 2.5 --2.5 1 Clock Time (24 hr) 13:05 13:17 [id number]:2214:27--14:57 15:09 Treatment Length: 124 (minutes) Treatment Segments: 4 Vital Signs Capillary Blood Glucose Reference Range: 80 - 120 mg / dl HBO Diabetic Blood Glucose Intervention Range: <131 mg/dl or >528 mg/dl Time Vitals Blood Respiratory Capillary Blood Glucose Pulse Action Type: Pulse: Temperature: Taken: Pressure: Rate: Glucose (mg/dl): Meter #: Oximetry (%) Taken: Pre 13:05 130/62 53 15 97.6 Post 15:11 131/59 51 14 97.8 Treatment Response Treatment Toleration: Well Treatment Completion Treatment Completed without Adverse Event Status: [name] Notes No concerns with treatment given. This was the patient's last  hyperbaric treatment. He is going for dental extractions soon. After that he has an appointment for reconstructive surgery to see if anything can be done with the full in the roof of his mouth connecting with the nasal cavity. At this point I am uncertain whether his oral surgeon will want further hyperbaric treatment in the short-term. Physician HBO Attestation: I certify that I supervised this HBO treatment in accordance with Medicare guidelines. A trained Yes emergency response team is readily available per hospital policies and procedures. Continue HBOT as ordered. Yes Electronic Signature(s) Signed: 08/28/2019 6:04:26 PM By: [name] Najjar MD Previous Signature: 08/28/2019 4:37:37 PM Version By: [name] EMT/HBOT Entered By: [name] on 08/28/2019 18:03:32 -------------------------------------------------------------------------------- HBO Safety Checklist Details Patient Name: Date of Service: [name]. 08/28/2019 1:00 PM Medical Record [id number] Patient Account Number: [id number] Date of Birth/Sex: Treating RN: 1945/12/31 (74 y.o. M) Primary Care [name]: [name], PCP Other Clinician: Benjaman [name] Referring [name]: Treating [name]/Extender:[name] in Treatment: 11 HBO Safety Checklist Items Safety Checklist Consent Form Signed Patient voided / foley secured and emptied When did you last eato n/a Last dose of injectable or oral agent n/a NA Ostomy pouch emptied and vented if applicable NA All implantable devices assessed, documented and approved NA Intravenous access site secured and place Valuables secured Linens and cotton and cotton/polyester blend (less than 51% polyester) Personal oil-based products / skin lotions / body lotions removed NA Wigs or hairpieces removed NA Smoking or tobacco materials removed Books / newspapers / magazines / loose paper removed Cologne, aftershave, perfume and deodorant removed Jewelry  removed (may wrap wedding band) NA Make-up removed Hair care products removed NA Battery operated devices (external) removed NA Heating patches and chemical warmers removed NA Titanium eyewear removed NA Nail polish cured greater than 10 hours NA Casting material cured greater than 10 hours NA Hearing aids removed NA Loose dentures or partials removed NA Prosthetics  have been removed Patient demonstrates correct use of air break device (if applicable) Patient concerns have been addressed Patient grounding bracelet on and cord attached to chamber Specifics for Inpatients (complete in addition to above) Medication sheet sent with patient Intravenous medications needed or due during therapy sent with patient Drainage tubes (e.g. nasogastric tube or chest tube secured and vented) Endotracheal or Tracheotomy tube secured Cuff deflated of air and inflated with saline Airway suctioned Electronic Signature(s) Signed: 08/28/2019 1:32:28 PM By: [name] EMT/HBOT Entered By: [name] on 08/28/2019 13:32:27

## 2019-08-29 ENCOUNTER — Encounter (HOSPITAL_BASED_OUTPATIENT_CLINIC_OR_DEPARTMENT_OTHER): Payer: Medicare HMO | Admitting: Internal Medicine

## 2019-08-30 ENCOUNTER — Encounter (HOSPITAL_BASED_OUTPATIENT_CLINIC_OR_DEPARTMENT_OTHER): Payer: Medicare HMO | Admitting: Physician Assistant

## 2019-08-31 ENCOUNTER — Encounter (HOSPITAL_BASED_OUTPATIENT_CLINIC_OR_DEPARTMENT_OTHER): Payer: Medicare HMO | Admitting: Internal Medicine

## 2019-09-01 ENCOUNTER — Encounter (HOSPITAL_BASED_OUTPATIENT_CLINIC_OR_DEPARTMENT_OTHER): Payer: Medicare HMO | Admitting: Internal Medicine

## 2019-09-04 ENCOUNTER — Encounter (HOSPITAL_BASED_OUTPATIENT_CLINIC_OR_DEPARTMENT_OTHER): Payer: Medicare HMO | Admitting: Internal Medicine

## 2019-09-04 DIAGNOSIS — C44329 Squamous cell carcinoma of skin of other parts of face: Secondary | ICD-10-CM | POA: Diagnosis not present

## 2019-09-04 DIAGNOSIS — R1312 Dysphagia, oropharyngeal phase: Secondary | ICD-10-CM | POA: Diagnosis not present

## 2019-09-04 DIAGNOSIS — R627 Adult failure to thrive: Secondary | ICD-10-CM | POA: Diagnosis not present

## 2019-09-05 ENCOUNTER — Encounter (HOSPITAL_BASED_OUTPATIENT_CLINIC_OR_DEPARTMENT_OTHER): Payer: Medicare HMO | Admitting: Internal Medicine

## 2019-09-06 ENCOUNTER — Encounter (HOSPITAL_BASED_OUTPATIENT_CLINIC_OR_DEPARTMENT_OTHER): Payer: Medicare HMO | Admitting: Physician Assistant

## 2019-09-06 DIAGNOSIS — K006 Disturbances in tooth eruption: Secondary | ICD-10-CM | POA: Diagnosis not present

## 2019-09-07 ENCOUNTER — Encounter (HOSPITAL_BASED_OUTPATIENT_CLINIC_OR_DEPARTMENT_OTHER): Payer: Medicare HMO | Admitting: Internal Medicine

## 2019-09-08 ENCOUNTER — Encounter (HOSPITAL_BASED_OUTPATIENT_CLINIC_OR_DEPARTMENT_OTHER): Payer: Medicare HMO | Admitting: Internal Medicine

## 2019-09-11 ENCOUNTER — Encounter (HOSPITAL_BASED_OUTPATIENT_CLINIC_OR_DEPARTMENT_OTHER): Payer: Medicare HMO | Admitting: Internal Medicine

## 2019-09-12 ENCOUNTER — Encounter (HOSPITAL_BASED_OUTPATIENT_CLINIC_OR_DEPARTMENT_OTHER): Payer: Medicare HMO | Admitting: Internal Medicine

## 2019-09-13 ENCOUNTER — Encounter (HOSPITAL_BASED_OUTPATIENT_CLINIC_OR_DEPARTMENT_OTHER): Payer: Medicare HMO | Admitting: Physician Assistant

## 2019-09-14 ENCOUNTER — Encounter (HOSPITAL_BASED_OUTPATIENT_CLINIC_OR_DEPARTMENT_OTHER): Payer: Medicare HMO | Admitting: Internal Medicine

## 2019-09-15 ENCOUNTER — Encounter (HOSPITAL_BASED_OUTPATIENT_CLINIC_OR_DEPARTMENT_OTHER): Payer: Medicare HMO | Admitting: Internal Medicine

## 2019-09-18 DIAGNOSIS — R1312 Dysphagia, oropharyngeal phase: Secondary | ICD-10-CM | POA: Diagnosis not present

## 2019-09-18 DIAGNOSIS — R627 Adult failure to thrive: Secondary | ICD-10-CM | POA: Diagnosis not present

## 2019-09-18 DIAGNOSIS — C44329 Squamous cell carcinoma of skin of other parts of face: Secondary | ICD-10-CM | POA: Diagnosis not present

## 2019-09-26 ENCOUNTER — Ambulatory Visit: Payer: Medicare HMO | Admitting: Cardiovascular Disease

## 2019-09-26 ENCOUNTER — Encounter: Payer: Self-pay | Admitting: Cardiovascular Disease

## 2019-09-26 ENCOUNTER — Other Ambulatory Visit: Payer: Self-pay

## 2019-09-26 VITALS — BP 128/80 | HR 44 | Ht 70.0 in | Wt 157.0 lb

## 2019-09-26 DIAGNOSIS — E7800 Pure hypercholesterolemia, unspecified: Secondary | ICD-10-CM

## 2019-09-26 DIAGNOSIS — R001 Bradycardia, unspecified: Secondary | ICD-10-CM

## 2019-09-26 DIAGNOSIS — I25810 Atherosclerosis of coronary artery bypass graft(s) without angina pectoris: Secondary | ICD-10-CM | POA: Diagnosis not present

## 2019-09-26 DIAGNOSIS — Z931 Gastrostomy status: Secondary | ICD-10-CM | POA: Diagnosis not present

## 2019-09-26 NOTE — Progress Notes (Signed)
Patient ID: [name], male   DOB: 1945/11/30, 74 y.o.   MRN: [id number]    Cardiology Office Note    Date:  09/28/2019   ID:  [name], [name] Jul 29, 1946, MRN [id number]  PCP:  System, Pcp Not In  Cardiologist:   Thurmon Fair, MD   Chief Complaint  Patient presents with  . Coronary Artery Disease    History of Present Illness:  [name] is a 74 y.o. male who underwent coronary bypass surgery for multivessel CAD in 2005. He has treated hyperlipidemia and hypertension.   He continues to be free of any cardiovascular complaints. The patient specifically denies any chest pain at rest exertion, dyspnea at rest or with exertion, orthopnea, paroxysmal nocturnal dyspnea, syncope, palpitations, focal neurological deficits, intermittent claudication, lower extremity edema, unexplained weight gain, cough, hemoptysis or wheezing. He has not taken loop diuretics in several months.  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 His teeth have been extracted from the upper maxillary. This prevents him from eating and drinking and he is still getting nutrition through a gastrostomy tube. Thankfully he has managed to put on a little bit of weight and now has a normal BMI of 22.5. He is excited that he will soon be able to start discussion regarding reconstructive surgery. Continues to have problems with his taste.  He is taking aspirin and statin, but is not on beta-blockers since he has marked sinus bradycardia. He denies problems of dizziness, lightheadedness, weakness or syncope. His EKG does not show any other abnormalities except for sinus bradycardia.  He has not had problems with coronary symptoms since his bypass surgery.  He has a "false positive"  ECG treadmill response, but had a normal nuclear stress test in 2011. In 2016, his exercise capacity was identical to 2011 (8 minutes on the Bruce protocol).  He has long-standing problems with erectile dysfunction.   Past Medical History:  Diagnosis Date  . 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 Laneta Simmers  . HTN (hypertension) 07/04/2013  . Hyperlipidemia 07/04/2013    Past Surgical History:  Procedure Laterality Date  . CARDIAC CATHETERIZATION  01/15/2004  . CORONARY ARTERY BYPASS GRAFT  01/16/2004   CABG's x4  . NM MYOCAR PERF WALL MOTION  01/16/2010   EF 65%,Mets 10    Current Medications: Outpatient Medications Prior to Visit  Medication Sig Dispense Refill  . aspirin EC 81 MG tablet Take 81 mg by mouth daily.    . Omega 3 1200 MG CAPS Take by mouth.    . simvastatin (ZOCOR) 20 MG tablet Take 1 tablet (20 mg total) by mouth daily at 6 PM. 90 tablet 0  . furosemide (LASIX) 20 MG tablet Take 1 tablet (20 mg total) by mouth daily as needed for edema. 45 tablet 3   No facility-administered medications prior to visit.     Allergies:   Aspartame and Bupropion   Social History   Socioeconomic History  . Marital status: Divorced    Spouse name: Not on file  . Number of children: Not on file  . Years of education: Not on file  . Highest  education level: Not on file  Occupational History  . Not on file  Tobacco Use  . Smoking status: Former Smoker    Quit date: 08/16/2005    Years since quitting: 14.1  . Smokeless tobacco: Never Used  Substance and Sexual Activity  . Alcohol use: Yes    Comment: rarely  . Drug use: No  . Sexual activity: Not on file  Other Topics Concern  . Not on file  Social History Narrative  . Not on file   Social Determinants of Health   Financial Resource Strain:   . Difficulty of Paying Living Expenses: Not on file  Food Insecurity:   . Worried About Community education officer in the Last Year: Not on file  . Ran Out of Food in the Last Year: Not on file  Transportation Needs:   . Lack of Transportation (Medical): Not on file  . Lack of Transportation (Non-Medical): Not on file  Physical Activity:   . Days of Exercise per Week: Not on file  . Minutes of Exercise per Session: Not on file  Stress:   . Feeling of Stress : Not on file  Social Connections:   . Frequency of Communication with Friends and Family: Not on file  . Frequency of Social Gatherings with Friends and Family: Not on file  . Attends Religious Services: Not on file  . Active Member of Clubs or Organizations: Not on file  . Attends Banker Meetings: Not on file  . Marital Status: Not on file    ROS:   Please see the history of present illness.    ROS All other systems are reviewed and are negative.   PHYSICAL EXAM:   VS:  BP 128/80   Pulse (!) 44   Ht 5\' 10"  (1.778 m)   Wt 157 lb (71.2 kg)   BMI 22.53 kg/m     General: Alert, oriented x3, no distress, he is lean but not cachectic or malnourished Head: He has an orifice connecting his right nostril area to his nasal and oral cavity with a large defect in the hard palate, PERRL, EOMI, no exophtalmos or lid lag, no myxedema, no xanthelasma; normal ears, nose and oropharynx Neck: normal jugular venous pulsations and no hepatojugular reflux; brisk carotid pulses without delay and no carotid bruits Chest: clear to auscultation, no signs of consolidation by percussion or palpation, normal fremitus, symmetrical and full respiratory excursions Cardiovascular: normal position and quality of the apical impulse, regular rhythm, normal first and second heart sounds, no murmurs, rubs or gallops Abdomen: no tenderness or distention, no masses by palpation, no abnormal pulsatility or arterial bruits, normal bowel sounds, no hepatosplenomegaly Extremities: no clubbing, cyanosis or edema; 2+ radial, ulnar and brachial pulses  bilaterally; 2+ right femoral, posterior tibial and dorsalis pedis pulses; 2+ left femoral, posterior tibial and dorsalis pedis pulses; no subclavian or femoral bruits Neurological: grossly nonfocal Psych: Normal mood and affect   Wt Readings from Last 3 Encounters:  09/26/19 157 lb (71.2 kg)  05/13/18 151 lb 12.8 oz (68.9 kg)  02/08/18 160 lb (72.6 kg)      Studies/Labs Reviewed:   EKG:  EKG is ordered today. It shows marked sinus bradycardia at 44 bpm but is otherwise normal. QT 391 ms  Recent Labs: No results found for requested labs within last 8760 hours.  05/03/2018 creatinine 0.6, potassium 4.9, ALT 18 04/25/2019 creatinine 0.59, potassium 4.6, liver function tests normal, albumin 4.0, Hemoglobin 12.6 with normocytic indices  Lipid Panel    Component Value Date/Time   CHOL 139 05/03/2018 1043   TRIG 66 05/03/2018 1043   HDL 58 05/03/2018 1043   CHOLHDL 2.4 05/03/2018 1043   CHOLHDL 3.0 10/18/2014 1033   VLDL 16 10/18/2014 1033   LDLCALC 68 05/03/2018 1043     ASSESSMENT:    1. Coronary artery disease involving coronary bypass graft of native heart without angina pectoris   2. Hypercholesterolemia   3. Sinus bradycardia      PLAN:  In order of problems listed above:  1. CAD:  he is completely asymptomatic. He is on aspirin and statin but cannot take beta-blockers due to profound sinus bradycardia. 2. HLP: Needs an updated lipid profile. 3. Sinus bradycardia: He is completely asymptomatic despite having marked bradycardia. This is a chronic finding for him. I told him I would understand how the surgery might find his slow heartbeat a little disconcerting, but as far as I can tell there is no indication for permanent pacemaker implantation understands. Obviously, should avoid any medications with negative chronotropic effect such as beta-blockers, diltiazem or verapamil.  I believe he is at very low risk for major cardiovascular complications with facial reconstruction  surgery.   Medication Adjustments/Labs and Tests Ordered: Current medicines are reviewed at length with the patient today.  Concerns regarding medicines are outlined above.  Medication changes, Labs and Tests ordered today are listed in the Patient Instructions below. Patient Instructions  Medication Instructions:  No changes *If you need a refill on your cardiac medications before your next appointment, please call your pharmacy*  Lab Work: None ordered If you have labs (blood work) drawn today and your tests are completely normal, you will receive your results only by: Marland Kitchen MyChart Message (if you have MyChart) OR . A paper copy in the mail If you have any lab test that is abnormal or we need to change your treatment, we will call you to review the results.  Testing/Procedures: None ordered  Follow-Up: At [facility], you and your health needs are our priority.  As part of our continuing mission to provide you with exceptional heart care, we have created designated Provider Care Teams.  These Care Teams include your primary Cardiologist (physician) and Advanced Practice Providers (APPs -  Physician Assistants and Nurse Practitioners) who all work together to provide you with the care you need, when you need it.  Your next appointment:   12 month(s)  The format for your next appointment:   In Person  Provider:   You may see Thurmon Fair, MD or one of the following Advanced Practice Providers on your designated Care Team:    Azalee Course, PA-C  Micah Flesher, [place] or   Judy Pimple, PA-C       Signed, Thurmon Fair, MD  09/28/2019 4:36 PM    [facility] [address] [place], [place], [place]  [address] Phone: [phone]; Fax: [phone]

## 2019-09-26 NOTE — Patient Instructions (Signed)
Medication Instructions:  No changes *If you need a refill on your cardiac medications before your next appointment, please call your pharmacy*  Lab Work: None ordered If you have labs (blood work) drawn today and your tests are completely normal, you will receive your results only by: . MyChart Message (if you have MyChart) OR . A paper copy in the mail If you have any lab test that is abnormal or we need to change your treatment, we will call you to review the results.  Testing/Procedures: None ordered  Follow-Up: At [facility], you and your health needs are our priority.  As part of our continuing mission to provide you with exceptional heart care, we have created designated Provider Care Teams.  These Care Teams include your primary Cardiologist (physician) and Advanced Practice Providers (APPs -  Physician Assistants and Nurse Practitioners) who all work together to provide you with the care you need, when you need it.  Your next appointment:   12 month(s)  The format for your next appointment:   In Person  Provider:   You may see Mihai Croitoru, MD or one of the following Advanced Practice Providers on your designated Care Team:    Hao Meng, PA-C  Angela Duke, PA-C or   Krista Kroeger, PA-C  

## 2019-09-28 ENCOUNTER — Encounter: Payer: Self-pay | Admitting: Cardiovascular Disease

## 2019-10-05 DIAGNOSIS — R627 Adult failure to thrive: Secondary | ICD-10-CM | POA: Diagnosis not present

## 2019-10-05 DIAGNOSIS — C44329 Squamous cell carcinoma of skin of other parts of face: Secondary | ICD-10-CM | POA: Diagnosis not present

## 2019-10-05 DIAGNOSIS — R1312 Dysphagia, oropharyngeal phase: Secondary | ICD-10-CM | POA: Diagnosis not present

## 2019-10-16 DIAGNOSIS — R627 Adult failure to thrive: Secondary | ICD-10-CM | POA: Diagnosis not present

## 2019-10-16 DIAGNOSIS — R1312 Dysphagia, oropharyngeal phase: Secondary | ICD-10-CM | POA: Diagnosis not present

## 2019-10-16 DIAGNOSIS — C44329 Squamous cell carcinoma of skin of other parts of face: Secondary | ICD-10-CM | POA: Diagnosis not present

## 2019-10-20 DIAGNOSIS — Z87891 Personal history of nicotine dependence: Secondary | ICD-10-CM | POA: Diagnosis not present

## 2019-10-20 DIAGNOSIS — Z08 Encounter for follow-up examination after completed treatment for malignant neoplasm: Secondary | ICD-10-CM | POA: Diagnosis not present

## 2019-10-20 DIAGNOSIS — C4402 Squamous cell carcinoma of skin of lip: Secondary | ICD-10-CM | POA: Diagnosis not present

## 2019-10-20 DIAGNOSIS — Z85828 Personal history of other malignant neoplasm of skin: Secondary | ICD-10-CM | POA: Diagnosis not present

## 2019-10-25 ENCOUNTER — Other Ambulatory Visit: Payer: Self-pay

## 2019-10-25 MED ORDER — SIMVASTATIN 20 MG PO TABS: 20.0000 mg | ORAL_TABLET | Freq: Every day | ORAL | 0 refills | Status: DC

## 2019-11-06 DIAGNOSIS — C44329 Squamous cell carcinoma of skin of other parts of face: Secondary | ICD-10-CM | POA: Diagnosis not present

## 2019-11-06 DIAGNOSIS — R1312 Dysphagia, oropharyngeal phase: Secondary | ICD-10-CM | POA: Diagnosis not present

## 2019-11-06 DIAGNOSIS — R627 Adult failure to thrive: Secondary | ICD-10-CM | POA: Diagnosis not present

## 2019-11-16 DIAGNOSIS — C44329 Squamous cell carcinoma of skin of other parts of face: Secondary | ICD-10-CM | POA: Diagnosis not present

## 2019-11-16 DIAGNOSIS — R1312 Dysphagia, oropharyngeal phase: Secondary | ICD-10-CM | POA: Diagnosis not present

## 2019-11-16 DIAGNOSIS — R627 Adult failure to thrive: Secondary | ICD-10-CM | POA: Diagnosis not present

## 2019-12-04 DIAGNOSIS — C44329 Squamous cell carcinoma of skin of other parts of face: Secondary | ICD-10-CM | POA: Diagnosis not present

## 2019-12-04 DIAGNOSIS — R627 Adult failure to thrive: Secondary | ICD-10-CM | POA: Diagnosis not present

## 2019-12-04 DIAGNOSIS — R1312 Dysphagia, oropharyngeal phase: Secondary | ICD-10-CM | POA: Diagnosis not present

## 2019-12-16 DIAGNOSIS — R1312 Dysphagia, oropharyngeal phase: Secondary | ICD-10-CM | POA: Diagnosis not present

## 2019-12-16 DIAGNOSIS — C44329 Squamous cell carcinoma of skin of other parts of face: Secondary | ICD-10-CM | POA: Diagnosis not present

## 2019-12-16 DIAGNOSIS — R627 Adult failure to thrive: Secondary | ICD-10-CM | POA: Diagnosis not present

## 2020-01-02 DIAGNOSIS — R627 Adult failure to thrive: Secondary | ICD-10-CM | POA: Diagnosis not present

## 2020-01-02 DIAGNOSIS — C44329 Squamous cell carcinoma of skin of other parts of face: Secondary | ICD-10-CM | POA: Diagnosis not present

## 2020-01-02 DIAGNOSIS — R1312 Dysphagia, oropharyngeal phase: Secondary | ICD-10-CM | POA: Diagnosis not present

## 2020-01-16 DIAGNOSIS — R627 Adult failure to thrive: Secondary | ICD-10-CM | POA: Diagnosis not present

## 2020-01-16 DIAGNOSIS — R1312 Dysphagia, oropharyngeal phase: Secondary | ICD-10-CM | POA: Diagnosis not present

## 2020-01-16 DIAGNOSIS — C44329 Squamous cell carcinoma of skin of other parts of face: Secondary | ICD-10-CM | POA: Diagnosis not present

## 2020-01-29 ENCOUNTER — Encounter: Payer: Self-pay | Admitting: Family Medicine

## 2020-01-29 ENCOUNTER — Other Ambulatory Visit: Payer: Self-pay

## 2020-01-29 ENCOUNTER — Ambulatory Visit (INDEPENDENT_AMBULATORY_CARE_PROVIDER_SITE_OTHER): Payer: Medicare HMO | Admitting: Family Medicine

## 2020-01-29 VITALS — BP 127/68 | HR 50 | Temp 98.3°F | Resp 16 | Ht 70.0 in | Wt 158.0 lb

## 2020-01-29 DIAGNOSIS — Z8589 Personal history of malignant neoplasm of other organs and systems: Secondary | ICD-10-CM

## 2020-01-29 DIAGNOSIS — Q385 Congenital malformations of palate, not elsewhere classified: Secondary | ICD-10-CM | POA: Diagnosis not present

## 2020-01-29 DIAGNOSIS — I2510 Atherosclerotic heart disease of native coronary artery without angina pectoris: Secondary | ICD-10-CM | POA: Diagnosis not present

## 2020-01-29 DIAGNOSIS — Z23 Encounter for immunization: Secondary | ICD-10-CM

## 2020-01-29 MED ORDER — SIMVASTATIN 20 MG PO TABS: 20.0000 mg | ORAL_TABLET | Freq: Every day | ORAL | 3 refills | Status: DC

## 2020-01-29 NOTE — Progress Notes (Signed)
Subjective:  Patient ID: [name], male    DOB: [birth year]  Age: 74 y.o. MRN: [id number]  CC:  Chief Complaint  Patient presents with  . Establish Care    per pt he had cancer of the mouth with dental problems had surgery 09/2017, the cancer started 08/2017  . Referral    per pt because [employer] requesting pt to see a surgeon to repair hole in the roof of the mouth    HPI DEMETRY PARMETER presents for  Establish care and referral as above.   CAD: Status post coronary artery bypass creatinine 2005.  Cardiologist Dr. Royann Shivers, appointment 09/26/19.  Continue aspirin and statin but not beta-blockers due to sinus bradycardia.  Normal nuclear stress test in 2011, exercise capacity in 2016 that was identical to 2011 which is 8 minutes on the Bruce protocol.  Continued on same medications as asymptomatic  Squamous cell carcinoma head/neck Followed by [facility] care in [place].  History of surgery for a locally invasive squamous cell carcinoma in January 2019. Stage 4, involved right nostril with residual communicating orifice and the hard palate between oral cavity nasal cavity.  History of upper maxillary tooth extraction, nutrition through gastrostomy tube. Rad onc: Dr. Brendolyn Patty at  Initial surgery - 09/22/17 - Dr. Carolyne Fiscal in [facility] Now followed by ENT locally. Local ENT, Dr. Pollyann Kennedy. has had some hearing loss and vision issues since radiation. Loose teeth, had to have removed. Oncologist and dentist have now recommended repair of defect in upper palate. S/p 37 hyperbaric treatments for bloodflow to palate. Has hearing aids.  Has not been by optho recently - plans to schedule.  PET/CT scan on 02/14/19. - no hypermetabolic activity.  Has been seen by oral surgeon - Dr. Ellery Plunk in [place], who has  Was advised he needs to be seen by specialist - head and neck microvascular reconstruction per Dr. Ellery Plunk. No in-network provider - needs referral from PCP for plastic surgeon for head and neck  microvascular reconstruction.  HM: Has not had covid vaccine yet.  Colon:flex sig over 10 years ago. Normal.  Has test pending form [employer] (?fit test). Pneumovax:- today.   History Patient Active Problem List   Diagnosis Date Noted  . CAD s/p CABG 2005 07/04/2013  . Hyperlipidemia 07/04/2013  . HTN (hypertension) 07/04/2013  . Sinus bradycardia 07/04/2013   Past Medical History:  Diagnosis Date  . 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 Laneta Simmers  . HTN (hypertension) 07/04/2013  . Hyperlipidemia 07/04/2013   Past Surgical History:  Procedure Laterality Date  . CARDIAC CATHETERIZATION  01/15/2004  . CORONARY ARTERY BYPASS GRAFT  01/16/2004   CABG's x4  . HERNIA REPAIR N/A    Phreesia 01/27/2020  . NM MYOCAR PERF WALL MOTION  01/16/2010   EF 65%,Mets 10   Allergies  Allergen Reactions  . Aspartame Hives  . Bupropion Anxiety  . Other Hives and Itching   Prior to Admission medications   Medication Sig Start Date End Date Taking? Authorizing Provider  aspirin EC 81 MG tablet Take 81 mg by mouth daily.   Yes [provider]  Omega 3 1200 MG CAPS Take by mouth.   Yes [provider]  simvastatin (ZOCOR) 20 MG tablet Take 1 tablet (20 mg total) by mouth daily at 6 PM. 01/29/20  Yes Croitoru, Mihai, MD  furosemide (LASIX) 20 MG tablet Take 1  tablet (20 mg total) by mouth daily as needed for edema. 02/08/18 05/09/18  Croitoru, Rachelle Hora, MD   Social History   Socioeconomic History  . Marital status: Divorced    Spouse name: Not on file  . Number of children: Not on file  . Years of education: Not on file  . Highest education level: Not on file  Occupational History  . Not on file  Tobacco Use  . Smoking status: Former Smoker    Quit date: 08/16/2005    Years since quitting: 14.4  . Smokeless tobacco: Never Used  Substance and Sexual Activity  . Alcohol use: Yes    Comment:  rarely  . Drug use: No  . Sexual activity: Not on file  Other Topics Concern  . Not on file  Social History Narrative  . Not on file   Social Determinants of Health   Financial Resource Strain:   . Difficulty of Paying Living Expenses:   Food Insecurity:   . Worried About Programme researcher, broadcasting/film/video in the Last Year:   . Barista in the Last Year:   Transportation Needs:   . Freight forwarder (Medical):   Marland Kitchen Lack of Transportation (Non-Medical):   Physical Activity:   . Days of Exercise per Week:   . Minutes of Exercise per Session:   Stress:   . Feeling of Stress :   Social Connections:   . Frequency of Communication with Friends and Family:   . Frequency of Social Gatherings with Friends and Family:   . Attends Religious Services:   . Active Member of Clubs or Organizations:   . Attends Banker Meetings:   Marland Kitchen Marital Status:   Intimate Partner Violence:   . Fear of Current or Ex-Partner:   . Emotionally Abused:   Marland Kitchen Physically Abused:   . Sexually Abused:     Review of Systems Per HPI.  Objective:   Vitals:   01/29/20 1515  BP: 127/68  Pulse: (!) 50  Resp: 16  Temp: 98.3 F (36.8 C)  TempSrc: Temporal  SpO2: 97%  Weight: 158 lb (71.7 kg)  Height: 5\' 10"  (1.778 m)    Physical Exam Vitals reviewed.  Constitutional:      General: He is not in acute distress.    Appearance: He is well-developed.  HENT:     Head: Normocephalic and atraumatic.   Cardiovascular:     Rate and Rhythm: Normal rate.  Pulmonary:     Effort: Pulmonary effort is normal.  Neurological:     Mental Status: He is alert and oriented to person, place, and time.  Psychiatric:        Mood and Affect: Mood normal.     Assessment & Plan:  NGOZI [name] is a 74 y.o. male . History of cancer of head or neck Palate abnormality  -Residual defect as above that does affect his ability to talk, as well as eat.  Will refer to specialist/plastic surgeon to evaluate area.  We  will need to clarify specific specialist as recommended by his maxillofacial specialist, but likely will need to find a provider out of network in order to satisfy this need.   Need for vaccination for Strep pneumoniae - Plan: Pneumococcal polysaccharide vaccine 23-valent greater than or equal to 2yo subcutaneous/IM Given.   Coronary artery disease  -Asymptomatic, continue routine follow-up with cardiologist  Other health maintenance discussed, plans to discuss further at follow-up visit in the next month or so.  Did agree to Pneumovax as above.  Plans on scheduling Covid vaccine.  No orders of the defined types were placed in this encounter.  Patient Instructions   I do recommend covid vaccine as soon as possible.  COVID-19 Vaccine Information can be found at: PodExchange.nl For questions related to vaccine distribution or appointments, please email [email or web address] or call [phone].   I am happy to refer you to plastic surgeon. Let me discuss with Dr. Ellery Plunk to see if there is a preferred provider.   Pneumonia vaccine given today.  I do recommend you contact your eye care provider for updated exam, especially if there have been changes since radiation.  Recheck with me in 1 month to review plan and health history further.  Let me know if there are questions in the meantime and nice meeting you today.     If you have lab work done today you will be contacted with your lab results within the next 2 weeks.  If you have not heard from [place] then please contact us. The fastest way to get your results is to register for My Chart.   IF you received an x-ray today, you will receive an invoice from [facility]. Please contact [facility] at [phone] with questions or concerns regarding your invoice.   IF you received labwork today, you will receive an invoice from [place]. Please contact [facility] at  [phone] with questions or concerns regarding your invoice.   Our billing staff will not be able to assist you with questions regarding bills from these companies.  You will be contacted with the lab results as soon as they are available. The fastest way to get your results is to activate your My Chart account. Instructions are located on the last page of this paperwork. If you have not heard from [place] regarding the results in 2 weeks, please contact this office.         Signed, Meredith Staggers, MD [facility]

## 2020-01-29 NOTE — Patient Instructions (Addendum)
I do recommend covid vaccine as soon as possible.  COVID-19 Vaccine Information can be found at: ShippingScam.co.uk For questions related to vaccine distribution or appointments, please email [email or web address] or call [phone].   I am happy to refer you to plastic surgeon. Let me discuss with Dr. Luvenia Heller to see if there is a preferred provider.   Pneumonia vaccine given today.  I do recommend you contact your eye care provider for updated exam, especially if there have been changes since radiation.  Recheck with me in 1 month to review plan and health history further.  Let me know if there are questions in the meantime and nice meeting you today.     If you have lab work done today you will be contacted with your lab results within the next 2 weeks.  If you have not heard from [place] then please contact us. The fastest way to get your results is to register for My Chart.   IF you received an x-ray today, you will receive an invoice from [facility]. Please contact [facility] at [phone] with questions or concerns regarding your invoice.   IF you received labwork today, you will receive an invoice from [place]. Please contact [facility] at [phone] with questions or concerns regarding your invoice.   Our billing staff will not be able to assist you with questions regarding bills from these companies.  You will be contacted with the lab results as soon as they are available. The fastest way to get your results is to activate your My Chart account. Instructions are located on the last page of this paperwork. If you have not heard from [place] regarding the results in 2 weeks, please contact this office.

## 2020-01-30 ENCOUNTER — Encounter: Payer: Self-pay | Admitting: Family Medicine

## 2020-02-02 DIAGNOSIS — R627 Adult failure to thrive: Secondary | ICD-10-CM | POA: Diagnosis not present

## 2020-02-02 DIAGNOSIS — R1312 Dysphagia, oropharyngeal phase: Secondary | ICD-10-CM | POA: Diagnosis not present

## 2020-02-02 DIAGNOSIS — C44329 Squamous cell carcinoma of skin of other parts of face: Secondary | ICD-10-CM | POA: Diagnosis not present

## 2020-02-28 ENCOUNTER — Other Ambulatory Visit: Payer: Self-pay

## 2020-02-28 ENCOUNTER — Ambulatory Visit (INDEPENDENT_AMBULATORY_CARE_PROVIDER_SITE_OTHER): Payer: Medicare HMO | Admitting: Family Medicine

## 2020-02-28 ENCOUNTER — Encounter: Payer: Self-pay | Admitting: Family Medicine

## 2020-02-28 VITALS — BP 132/77 | HR 55 | Temp 97.9°F | Ht 70.0 in | Wt 161.0 lb

## 2020-02-28 DIAGNOSIS — Z1211 Encounter for screening for malignant neoplasm of colon: Secondary | ICD-10-CM | POA: Diagnosis not present

## 2020-02-28 DIAGNOSIS — Q385 Congenital malformations of palate, not elsewhere classified: Secondary | ICD-10-CM | POA: Diagnosis not present

## 2020-02-28 DIAGNOSIS — I2510 Atherosclerotic heart disease of native coronary artery without angina pectoris: Secondary | ICD-10-CM

## 2020-02-28 DIAGNOSIS — Z1159 Encounter for screening for other viral diseases: Secondary | ICD-10-CM | POA: Diagnosis not present

## 2020-02-28 DIAGNOSIS — Z8589 Personal history of malignant neoplasm of other organs and systems: Secondary | ICD-10-CM | POA: Diagnosis not present

## 2020-02-28 DIAGNOSIS — Z7189 Other specified counseling: Secondary | ICD-10-CM | POA: Diagnosis not present

## 2020-02-28 DIAGNOSIS — Z7185 Encounter for immunization safety counseling: Secondary | ICD-10-CM

## 2020-02-28 NOTE — Progress Notes (Signed)
Subjective:  Patient ID: [name], male    DOB: 20-May-1946  Age: 74 y.o. MRN: [id number]  CC:  Chief Complaint  Patient presents with   Follow-up    on head and neck cancer. pt reports no cnages since last vist. pt reports still no sighn of the cancer returning. pt reports he still hasn't gotten the covid vaccine that the provider recommened.    HPI [name] presents for   History of head/neck cancer with residual palate abnormality, residual loss of sense of taste/smell.   See last office visit 1 month ago.  Plan for follow-up with a specialist/plastic surgeon to evaluate area further.  He did provide me his maxillofacial specialist number last visit, will need to find a provider possibly out of network for head and neck microvascular reconstruction.  Local ENT: Pollyann Kennedy (prior Dr. Carolyne Fiscal in [facility]).  Maxillofacial: [facility]. Has appt 04/08/20 with optho - Dr. Sharlot Gowda on [facility].  Would like to defer speech therapy until possible palate surgery.   Health maintenance: Covid vaccine: has not had yet. Still plans to have given - will be done at [facility] near his home.  Colonoscopy:  cologuard sent in recently? Possibly ft test. - only stool on card.  Hep C screen: Normal lipids in 2019. Followed by cardiology. BMP nl in 2019. Takes zocor. On asa 81mg  qd.   CAD: Chronic bradycardia, asymptomatic, not on betablocker.  Cardiology visit 09/26/19 - updated lipids recommended.    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 Laneta Simmers   HTN (hypertension) 07/04/2013   Hyperlipidemia 07/04/2013   Past Surgical History:  Procedure Laterality Date   CARDIAC CATHETERIZATION   01/15/2004   CORONARY ARTERY BYPASS GRAFT  01/16/2004   CABG's x4   HERNIA REPAIR N/A    Phreesia 01/27/2020   NM MYOCAR PERF WALL MOTION  01/16/2010   EF 65%,Mets 10   Allergies  Allergen Reactions   Aspartame Hives   Bupropion Anxiety   Other Hives and Itching   Prior to Admission medications   Medication Sig Start Date End Date Taking? Authorizing Provider  aspirin EC 81 MG tablet Take 81 mg by mouth daily.   Yes [provider]  simvastatin (ZOCOR) 20 MG tablet Take 1 tablet (20 mg total) by mouth daily at 6 PM. 01/29/20  Yes Croitoru, Mihai, MD  furosemide (LASIX) 20 MG tablet Take 1 tablet (20 mg total) by mouth daily as needed for edema. 02/08/18 05/09/18  Croitoru, Mihai, MD  Omega 3 1200 MG CAPS Take by mouth. Patient not taking: Reported on 02/28/2020    [provider]   Social History   Socioeconomic History   Marital status: Divorced    Spouse name: Not on file   Number of children: Not on file   Years of education: Not on file   Highest education level: Not on file  Occupational History   Not on file  Tobacco Use   Smoking status: Former Smoker    Quit date: 08/16/2005    Years since quitting: 14.5   Smokeless tobacco: Never Used  Substance and Sexual Activity   Alcohol use: Yes  Comment: rarely   Drug use: No   Sexual activity: Not on file  Other Topics Concern   Not on file  Social History Narrative   Not on file   Social Determinants of Health   Financial Resource Strain:    Difficulty of Paying Living Expenses:   Food Insecurity:    Worried About Running Out of Food in the Last Year:    Barista in the Last Year:   Transportation Needs:    Freight forwarder (Medical):    Lack of Transportation (Non-Medical):   Physical Activity:    Days of Exercise per Week:    Minutes of Exercise per Session:   Stress:    Feeling of Stress :   Social Connections:    Frequency of Communication with  Friends and Family:    Frequency of Social Gatherings with Friends and Family:    Attends Religious Services:    Active Member of Clubs or Organizations:    Attends Engineer, structural:    Marital Status:   Intimate Partner Violence:    Fear of Current or Ex-Partner:    Emotionally Abused:    Physically Abused:    Sexually Abused:     Review of Systems Per hpi  Objective:   Vitals:   02/28/20 1540  BP: 132/77  Pulse: (!) 55  Temp: 97.9 F (36.6 C)  TempSrc: Temporal  SpO2: 97%  Weight: 161 lb (73 kg)  Height: 5\' 10"  (1.778 m)     Physical Exam Constitutional:      General: He is not in acute distress.    Appearance: He is well-developed.  HENT:     Head: Normocephalic and atraumatic.  Cardiovascular:     Rate and Rhythm: Normal rate.  Pulmonary:     Effort: Pulmonary effort is normal.  Neurological:     Mental Status: He is alert and oriented to person, place, and time.        Assessment & Plan:  [name] is a 74 y.o. male . Palate abnormality History of cancer of head or neck  - followed by local maxillofacial surgeon but with his palate abnormality will need microvascular specialist. Will place referral. No specific name provided by Dr. Ellery Plunk.   Special screening for malignant neoplasms, colon - Plan: Ambulatory referral to Gastroenterology  -I suspect he probably had a fit test, not Cologuard, referred to gastroenterology.  Coronary artery disease involving native heart without angina pectoris, unspecified vessel or lesion type - Plan: Lipid panel, Comprehensive metabolic panel  -Asymptomatic, check labs  Encounter for hepatitis C screening test for low risk patient - Plan: Hepatitis C antibody  Vaccine counseling  -Covid vaccine recommended  No orders of the defined types were placed in this encounter.  Patient Instructions   Please schedule appointment for Covid vaccine. `  If recent stool test was fit test, I would  recommend other testing. I will refer you for colonoscopy.   Thanks for your patience with the specialist referral. I will work on that.   Labs today - can send to your cardiologist.   Let me know if there are questions and take care.   If you have lab work done today you will be contacted with your lab results within the next 2 weeks.  If you have not heard from [place] then please contact us. The fastest way to get your results is to register for My Chart.   IF you received an  x-ray today, you will receive an invoice from [facility]. Please contact [facility] at [phone] with questions or concerns regarding your invoice.   IF you received labwork today, you will receive an invoice from [place]. Please contact [facility] at [phone] with questions or concerns regarding your invoice.   Our billing staff will not be able to assist you with questions regarding bills from these companies.  You will be contacted with the lab results as soon as they are available. The fastest way to get your results is to activate your My Chart account. Instructions are located on the last page of this paperwork. If you have not heard from [place] regarding the results in 2 weeks, please contact this office.         Signed, Meredith Staggers, MD [facility]

## 2020-02-28 NOTE — Patient Instructions (Addendum)
Please schedule appointment for Covid vaccine. `  If recent stool test was fit test, I would recommend other testing. I will refer you for colonoscopy.   Thanks for your patience with the specialist referral. I will work on that.   Labs today - can send to your cardiologist.   Let me know if there are questions and take care.   If you have lab work done today you will be contacted with your lab results within the next 2 weeks.  If you have not heard from [place] then please contact us. The fastest way to get your results is to register for My Chart.   IF you received an x-ray today, you will receive an invoice from [facility]. Please contact [facility] at [phone] with questions or concerns regarding your invoice.   IF you received labwork today, you will receive an invoice from [place]. Please contact [facility] at [phone] with questions or concerns regarding your invoice.   Our billing staff will not be able to assist you with questions regarding bills from these companies.  You will be contacted with the lab results as soon as they are available. The fastest way to get your results is to activate your My Chart account. Instructions are located on the last page of this paperwork. If you have not heard from [place] regarding the results in 2 weeks, please contact this office.

## 2020-02-29 LAB — COMPREHENSIVE METABOLIC PANEL
ALT: 46 IU/L — ABNORMAL HIGH (ref 0–44)
AST: 50 IU/L — ABNORMAL HIGH (ref 0–40)
Albumin/Globulin Ratio: 1.6 (ref 1.2–2.2)
Albumin: 4.6 g/dL (ref 3.7–4.7)
Alkaline Phosphatase: 125 IU/L — ABNORMAL HIGH (ref 48–121)
BUN/Creatinine Ratio: 34 — ABNORMAL HIGH (ref 10–24)
BUN: 23 mg/dL (ref 8–27)
Bilirubin Total: 0.5 mg/dL (ref 0.0–1.2)
CO2: 28 mmol/L (ref 20–29)
Calcium: 9.6 mg/dL (ref 8.6–10.2)
Chloride: 100 mmol/L (ref 96–106)
Creatinine, Ser: 0.67 mg/dL — ABNORMAL LOW (ref 0.76–1.27)
GFR calc Af Amer: 112 mL/min/{1.73_m2} (ref >59)
GFR calc non Af Amer: 97 mL/min/{1.73_m2} (ref >59)
Globulin, Total: 2.8 g/dL (ref 1.5–4.5)
Glucose: 81 mg/dL (ref 65–99)
Potassium: 4.5 mmol/L (ref 3.5–5.2)
Sodium: 139 mmol/L (ref 134–144)
Total Protein: 7.4 g/dL (ref 6.0–8.5)

## 2020-02-29 LAB — HEPATITIS C ANTIBODY: Hep C Virus Ab: <0.1 s/co ratio (ref 0.0–0.9)

## 2020-02-29 LAB — LIPID PANEL
Chol/HDL Ratio: 2.9 ratio (ref 0.0–5.0)
Cholesterol, Total: 160 mg/dL (ref 100–199)
HDL: 56 mg/dL (ref >39)
LDL Chol Calc (NIH): 86 mg/dL (ref 0–99)
Triglycerides: 99 mg/dL (ref 0–149)
VLDL Cholesterol Cal: 18 mg/dL (ref 5–40)

## 2020-03-05 ENCOUNTER — Encounter: Payer: Self-pay | Admitting: Family Medicine

## 2020-03-07 DIAGNOSIS — C44329 Squamous cell carcinoma of skin of other parts of face: Secondary | ICD-10-CM | POA: Diagnosis not present

## 2020-03-07 DIAGNOSIS — R1312 Dysphagia, oropharyngeal phase: Secondary | ICD-10-CM | POA: Diagnosis not present

## 2020-03-07 DIAGNOSIS — R627 Adult failure to thrive: Secondary | ICD-10-CM | POA: Diagnosis not present

## 2020-03-11 ENCOUNTER — Telehealth: Payer: Self-pay | Admitting: Family Medicine

## 2020-03-11 NOTE — Telephone Encounter (Signed)
[name] from [place] [facility] called wanting to fax over medical records per patient request   Gave her our fax number

## 2020-03-12 NOTE — Telephone Encounter (Signed)
Oral surgeons office was calling to send [place] records so when we refer this pt we will have full history

## 2020-03-16 DIAGNOSIS — K219 Gastro-esophageal reflux disease without esophagitis: Secondary | ICD-10-CM | POA: Diagnosis not present

## 2020-03-16 DIAGNOSIS — Z4659 Encounter for fitting and adjustment of other gastrointestinal appliance and device: Secondary | ICD-10-CM | POA: Diagnosis not present

## 2020-03-16 DIAGNOSIS — Z87891 Personal history of nicotine dependence: Secondary | ICD-10-CM | POA: Diagnosis not present

## 2020-03-16 DIAGNOSIS — R21 Rash and other nonspecific skin eruption: Secondary | ICD-10-CM | POA: Diagnosis not present

## 2020-03-16 DIAGNOSIS — K9423 Gastrostomy malfunction: Secondary | ICD-10-CM | POA: Diagnosis not present

## 2020-03-16 DIAGNOSIS — Z79899 Other long term (current) drug therapy: Secondary | ICD-10-CM | POA: Diagnosis not present

## 2020-03-16 DIAGNOSIS — I10 Essential (primary) hypertension: Secondary | ICD-10-CM | POA: Diagnosis not present

## 2020-03-16 DIAGNOSIS — Z7982 Long term (current) use of aspirin: Secondary | ICD-10-CM | POA: Diagnosis not present

## 2020-03-16 DIAGNOSIS — Z431 Encounter for attention to gastrostomy: Secondary | ICD-10-CM | POA: Diagnosis not present

## 2020-03-16 DIAGNOSIS — I2510 Atherosclerotic heart disease of native coronary artery without angina pectoris: Secondary | ICD-10-CM | POA: Diagnosis not present

## 2020-03-16 DIAGNOSIS — Z888 Allergy status to other drugs, medicaments and biological substances status: Secondary | ICD-10-CM | POA: Diagnosis not present

## 2020-04-04 ENCOUNTER — Encounter: Payer: Self-pay | Admitting: Gastroenterology

## 2020-04-06 DIAGNOSIS — R1312 Dysphagia, oropharyngeal phase: Secondary | ICD-10-CM | POA: Diagnosis not present

## 2020-04-06 DIAGNOSIS — R627 Adult failure to thrive: Secondary | ICD-10-CM | POA: Diagnosis not present

## 2020-04-06 DIAGNOSIS — C44329 Squamous cell carcinoma of skin of other parts of face: Secondary | ICD-10-CM | POA: Diagnosis not present

## 2020-04-08 DIAGNOSIS — H25043 Posterior subcapsular polar age-related cataract, bilateral: Secondary | ICD-10-CM | POA: Diagnosis not present

## 2020-04-08 DIAGNOSIS — H25012 Cortical age-related cataract, left eye: Secondary | ICD-10-CM | POA: Diagnosis not present

## 2020-04-08 DIAGNOSIS — H2512 Age-related nuclear cataract, left eye: Secondary | ICD-10-CM | POA: Diagnosis not present

## 2020-04-09 DIAGNOSIS — Z0100 Encounter for examination of eyes and vision without abnormal findings: Secondary | ICD-10-CM | POA: Diagnosis not present

## 2020-05-01 DIAGNOSIS — Z931 Gastrostomy status: Secondary | ICD-10-CM | POA: Diagnosis not present

## 2020-05-01 DIAGNOSIS — C44320 Squamous cell carcinoma of skin of unspecified parts of face: Secondary | ICD-10-CM | POA: Diagnosis not present

## 2020-05-01 DIAGNOSIS — T888XXA Other specified complications of surgical and medical care, not elsewhere classified, initial encounter: Secondary | ICD-10-CM | POA: Diagnosis not present

## 2020-05-01 DIAGNOSIS — Z923 Personal history of irradiation: Secondary | ICD-10-CM | POA: Diagnosis not present

## 2020-05-01 DIAGNOSIS — Z8522 Personal history of malignant neoplasm of nasal cavities, middle ear, and accessory sinuses: Secondary | ICD-10-CM | POA: Diagnosis not present

## 2020-05-01 DIAGNOSIS — M2518 Fistula, other specified site: Secondary | ICD-10-CM | POA: Diagnosis not present

## 2020-05-01 DIAGNOSIS — Z888 Allergy status to other drugs, medicaments and biological substances status: Secondary | ICD-10-CM | POA: Diagnosis not present

## 2020-05-03 DIAGNOSIS — C009 Malignant neoplasm of lip, unspecified: Secondary | ICD-10-CM | POA: Diagnosis not present

## 2020-05-03 DIAGNOSIS — C44320 Squamous cell carcinoma of skin of unspecified parts of face: Secondary | ICD-10-CM | POA: Diagnosis not present

## 2020-05-07 DIAGNOSIS — C44329 Squamous cell carcinoma of skin of other parts of face: Secondary | ICD-10-CM | POA: Diagnosis not present

## 2020-05-07 DIAGNOSIS — R1312 Dysphagia, oropharyngeal phase: Secondary | ICD-10-CM | POA: Diagnosis not present

## 2020-05-07 DIAGNOSIS — R627 Adult failure to thrive: Secondary | ICD-10-CM | POA: Diagnosis not present

## 2020-05-13 DIAGNOSIS — Z85828 Personal history of other malignant neoplasm of skin: Secondary | ICD-10-CM | POA: Diagnosis not present

## 2020-05-13 DIAGNOSIS — C4402 Squamous cell carcinoma of skin of lip: Secondary | ICD-10-CM | POA: Diagnosis not present

## 2020-05-13 DIAGNOSIS — Z08 Encounter for follow-up examination after completed treatment for malignant neoplasm: Secondary | ICD-10-CM | POA: Diagnosis not present

## 2020-05-15 DIAGNOSIS — Z20828 Contact with and (suspected) exposure to other viral communicable diseases: Secondary | ICD-10-CM | POA: Diagnosis not present

## 2020-05-15 DIAGNOSIS — U071 COVID-19: Secondary | ICD-10-CM | POA: Diagnosis not present

## 2020-05-17 ENCOUNTER — Other Ambulatory Visit: Payer: Self-pay | Admitting: Oncology

## 2020-05-17 DIAGNOSIS — U071 COVID-19: Secondary | ICD-10-CM

## 2020-05-17 NOTE — Progress Notes (Signed)
I connected by phone with  [name] to discuss the potential use of an new treatment for mild to moderate COVID-19 viral infection in non-hospitalized patients.   This patient is a age/sex that meets the FDA criteria for Emergency Use Authorization of casirivimab\imdevimab.  Has a (+) direct SARS-CoV-2 viral test result 1. Has mild or moderate COVID-19  2. Is ? 71 years of age and weighs ? 40 kg 3. Is NOT hospitalized due to COVID-19 4. Is NOT requiring oxygen therapy or requiring an increase in baseline oxygen flow rate due to COVID-19 5. Is within 10 days of symptom onset 6. Has at least one of the high risk factor(s) for progression to severe COVID-19 and/or hospitalization as defined in EUA. Specific high risk criteria : Past Medical History:  Diagnosis Date  . 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 Cyndia Bent  . HTN (hypertension) 07/04/2013  . Hyperlipidemia 07/04/2013  ?  ?    Symptom onset  05/14/20   I have spoken and communicated the following to the patient or parent/caregiver:   1. FDA has authorized the emergency use of casirivimab\imdevimab for the treatment of mild to moderate COVID-19 in adults and pediatric patients with positive results of direct SARS-CoV-2 viral testing who are 77 years of age and older weighing at least 40 kg, and who are at high risk for progressing to severe COVID-19 and/or hospitalization.   2. The significant known and potential risks and benefits of casirivimab\imdevimab, and the extent to which such potential risks and benefits are unknown.   3. Information on available alternative treatments and the risks and benefits of those alternatives, including clinical trials.   4. Patients treated with casirivimab\imdevimab should continue to self-isolate and use infection control measures (e.g., wear mask, isolate, social distance, avoid sharing personal items, clean and  disinfect "high touch" surfaces, and frequent handwashing) according to CDC guidelines.    5. The patient or parent/caregiver has the option to accept or refuse casirivimab\imdevimab .   After reviewing this information with the patient, The patient agreed to proceed with receiving casirivimab\imdevimab infusion and will be provided a copy of the Fact sheet prior to receiving the infusion.Rulon Abide, AGNP-C [id number] ([place])

## 2020-05-18 ENCOUNTER — Ambulatory Visit (HOSPITAL_COMMUNITY)
Admission: RE | Admit: 2020-05-18 | Discharge: 2020-05-18 | Disposition: A | Discharge status: Home / Self Care | Payer: Medicare HMO | Source: Ambulatory Visit | Attending: Pulmonary Disease | Admitting: Pulmonary Disease

## 2020-05-18 ENCOUNTER — Emergency Department (HOSPITAL_COMMUNITY): Payer: Medicare HMO

## 2020-05-18 ENCOUNTER — Inpatient Hospital Stay (HOSPITAL_COMMUNITY)
Admission: EM | Admit: 2020-05-18 | Discharge: 2020-05-25 | DRG: 177 | Disposition: A | Discharge status: Home / Self Care | Payer: Medicare HMO | Source: Ambulatory Visit | Attending: Family Medicine | Admitting: Family Medicine

## 2020-05-18 ENCOUNTER — Other Ambulatory Visit: Payer: Self-pay | Admitting: Family

## 2020-05-18 ENCOUNTER — Encounter (HOSPITAL_COMMUNITY): Payer: Self-pay | Admitting: Emergency Medicine

## 2020-05-18 DIAGNOSIS — Z951 Presence of aortocoronary bypass graft: Secondary | ICD-10-CM | POA: Diagnosis not present

## 2020-05-18 DIAGNOSIS — J849 Interstitial pulmonary disease, unspecified: Secondary | ICD-10-CM | POA: Diagnosis not present

## 2020-05-18 DIAGNOSIS — J9811 Atelectasis: Secondary | ICD-10-CM | POA: Diagnosis present

## 2020-05-18 DIAGNOSIS — Z931 Gastrostomy status: Secondary | ICD-10-CM | POA: Diagnosis not present

## 2020-05-18 DIAGNOSIS — R0902 Hypoxemia: Secondary | ICD-10-CM

## 2020-05-18 DIAGNOSIS — Z8589 Personal history of malignant neoplasm of other organs and systems: Secondary | ICD-10-CM | POA: Diagnosis not present

## 2020-05-18 DIAGNOSIS — Z9009 Acquired absence of other part of head and neck: Secondary | ICD-10-CM | POA: Diagnosis not present

## 2020-05-18 DIAGNOSIS — Z87891 Personal history of nicotine dependence: Secondary | ICD-10-CM | POA: Diagnosis not present

## 2020-05-18 DIAGNOSIS — Z289 Immunization not carried out for unspecified reason: Secondary | ICD-10-CM

## 2020-05-18 DIAGNOSIS — I959 Hypotension, unspecified: Secondary | ICD-10-CM

## 2020-05-18 DIAGNOSIS — I2510 Atherosclerotic heart disease of native coronary artery without angina pectoris: Secondary | ICD-10-CM | POA: Diagnosis present

## 2020-05-18 DIAGNOSIS — I10 Essential (primary) hypertension: Secondary | ICD-10-CM | POA: Diagnosis not present

## 2020-05-18 DIAGNOSIS — J9601 Acute respiratory failure with hypoxia: Secondary | ICD-10-CM | POA: Diagnosis not present

## 2020-05-18 DIAGNOSIS — R1319 Other dysphagia: Secondary | ICD-10-CM | POA: Diagnosis present

## 2020-05-18 DIAGNOSIS — E785 Hyperlipidemia, unspecified: Secondary | ICD-10-CM | POA: Diagnosis present

## 2020-05-18 DIAGNOSIS — C44329 Squamous cell carcinoma of skin of other parts of face: Secondary | ICD-10-CM | POA: Diagnosis not present

## 2020-05-18 DIAGNOSIS — U071 COVID-19: Principal | ICD-10-CM | POA: Diagnosis present

## 2020-05-18 DIAGNOSIS — Z28311 Partially vaccinated for covid-19: Secondary | ICD-10-CM

## 2020-05-18 DIAGNOSIS — R627 Adult failure to thrive: Secondary | ICD-10-CM | POA: Diagnosis not present

## 2020-05-18 DIAGNOSIS — R1312 Dysphagia, oropharyngeal phase: Secondary | ICD-10-CM | POA: Diagnosis not present

## 2020-05-18 LAB — CBC WITH DIFFERENTIAL/PLATELET
Abs Immature Granulocytes: 0.1 10*3/uL — ABNORMAL HIGH (ref 0.00–0.07)
Basophils Absolute: 0 10*3/uL (ref 0.0–0.1)
Basophils Relative: 0 %
Eosinophils Absolute: 0 10*3/uL (ref 0.0–0.5)
Eosinophils Relative: 0 %
HCT: 37.9 % — ABNORMAL LOW (ref 39.0–52.0)
Hemoglobin: 12.3 g/dL — ABNORMAL LOW (ref 13.0–17.0)
Immature Granulocytes: 2 %
Lymphocytes Relative: 8 %
Lymphs Abs: 0.5 10*3/uL — ABNORMAL LOW (ref 0.7–4.0)
MCH: 29 pg (ref 26.0–34.0)
MCHC: 32.5 g/dL (ref 30.0–36.0)
MCV: 89.4 fL (ref 80.0–100.0)
Monocytes Absolute: 0.5 10*3/uL (ref 0.1–1.0)
Monocytes Relative: 8 %
Neutro Abs: 5.6 10*3/uL (ref 1.7–7.7)
Neutrophils Relative %: 82 %
Platelets: 173 10*3/uL (ref 150–400)
RBC: 4.24 MIL/uL (ref 4.22–5.81)
RDW: 14.8 % (ref 11.5–15.5)
WBC: 6.7 10*3/uL (ref 4.0–10.5)
nRBC: 0 % (ref 0.0–0.2)

## 2020-05-18 LAB — LACTATE DEHYDROGENASE: LDH: 347 U/L — ABNORMAL HIGH (ref 98–192)

## 2020-05-18 LAB — FERRITIN: Ferritin: 573 ng/mL — ABNORMAL HIGH (ref 24–336)

## 2020-05-18 LAB — COMPREHENSIVE METABOLIC PANEL
ALT: 63 U/L — ABNORMAL HIGH (ref 0–44)
AST: 102 U/L — ABNORMAL HIGH (ref 15–41)
Albumin: 3.4 g/dL — ABNORMAL LOW (ref 3.5–5.0)
Alkaline Phosphatase: 81 U/L (ref 38–126)
Anion gap: 14 (ref 5–15)
BUN: 42 mg/dL — ABNORMAL HIGH (ref 8–23)
CO2: 27 mmol/L (ref 22–32)
Calcium: 9.1 mg/dL (ref 8.9–10.3)
Chloride: 103 mmol/L (ref 98–111)
Creatinine, Ser: 0.79 mg/dL (ref 0.61–1.24)
GFR calc Af Amer: >60 mL/min (ref >60)
GFR calc non Af Amer: >60 mL/min (ref >60)
Glucose, Bld: 95 mg/dL (ref 70–99)
Potassium: 3.9 mmol/L (ref 3.5–5.1)
Sodium: 144 mmol/L (ref 135–145)
Total Bilirubin: 1 mg/dL (ref 0.3–1.2)
Total Protein: 7.8 g/dL (ref 6.5–8.1)

## 2020-05-18 LAB — FIBRINOGEN: Fibrinogen: 674 mg/dL — ABNORMAL HIGH (ref 210–475)

## 2020-05-18 LAB — PROCALCITONIN: Procalcitonin: <0.1 ng/mL

## 2020-05-18 LAB — LACTIC ACID, PLASMA: Lactic Acid, Venous: 1.9 mmol/L (ref 0.5–1.9)

## 2020-05-18 LAB — TRIGLYCERIDES: Triglycerides: 44 mg/dL (ref <150)

## 2020-05-18 LAB — C-REACTIVE PROTEIN: CRP: 19.6 mg/dL — ABNORMAL HIGH (ref <1.0)

## 2020-05-18 LAB — D-DIMER, QUANTITATIVE: D-Dimer, Quant: 1.46 ug/mL-FEU — ABNORMAL HIGH (ref 0.00–0.50)

## 2020-05-18 MED ORDER — SIMVASTATIN 20 MG PO TABS
20.0000 mg | ORAL_TABLET | Freq: Every day | ORAL | Status: DC
  Administered 2020-05-18 – 2020-05-25 (×8): 20 mg via ORAL
  Filled 2020-05-18 (×8): qty 1

## 2020-05-18 MED ORDER — ACETAMINOPHEN 325 MG PO TABS: 650.0000 mg | ORAL_TABLET | Freq: Four times a day (QID) | ORAL | Status: DC | PRN

## 2020-05-18 MED ORDER — ONDANSETRON HCL 4 MG PO TABS: 4.0000 mg | ORAL_TABLET | Freq: Four times a day (QID) | ORAL | Status: DC | PRN

## 2020-05-18 MED ORDER — HYDROCOD POLST-CPM POLST ER 10-8 MG/5ML PO SUER: 5.0000 mL | Freq: Two times a day (BID) | ORAL | Status: DC | PRN

## 2020-05-18 MED ORDER — SODIUM CHLORIDE 0.9 % IV SOLN
200.0000 mg | Freq: Once | INTRAVENOUS | Status: AC
  Administered 2020-05-18: 200 mg via INTRAVENOUS
  Filled 2020-05-18: qty 200

## 2020-05-18 MED ORDER — ENOXAPARIN SODIUM 40 MG/0.4ML ~~LOC~~ SOLN
40.0000 mg | SUBCUTANEOUS | Status: DC
  Administered 2020-05-18 – 2020-05-25 (×8): 40 mg via SUBCUTANEOUS
  Filled 2020-05-18 (×8): qty 0.4

## 2020-05-18 MED ORDER — DEXAMETHASONE SODIUM PHOSPHATE 10 MG/ML IJ SOLN
6.0000 mg | Freq: Once | INTRAMUSCULAR | Status: AC
  Administered 2020-05-18: 6 mg via INTRAVENOUS
  Filled 2020-05-18: qty 1

## 2020-05-18 MED ORDER — ALBUTEROL SULFATE HFA 108 (90 BASE) MCG/ACT IN AERS
1.0000 | INHALATION_SPRAY | Freq: Four times a day (QID) | RESPIRATORY_TRACT | Status: DC
  Administered 2020-05-18 – 2020-05-19 (×5): 1 via RESPIRATORY_TRACT
  Filled 2020-05-18: qty 6.7

## 2020-05-18 MED ORDER — ONDANSETRON HCL 4 MG/2ML IJ SOLN: 4.0000 mg | Freq: Four times a day (QID) | INTRAMUSCULAR | Status: DC | PRN

## 2020-05-18 MED ORDER — ALBUTEROL SULFATE (2.5 MG/3ML) 0.083% IN NEBU: 2.5000 mg | INHALATION_SOLUTION | Freq: Four times a day (QID) | RESPIRATORY_TRACT | Status: DC

## 2020-05-18 MED ORDER — ACETAMINOPHEN 325 MG PO TABS
650.0000 mg | ORAL_TABLET | Freq: Once | ORAL | Status: AC
  Administered 2020-05-18: 650 mg via ORAL
  Filled 2020-05-18: qty 2

## 2020-05-18 MED ORDER — ASPIRIN 81 MG PO CHEW
81.0000 mg | CHEWABLE_TABLET | Freq: Every day | ORAL | Status: DC
  Administered 2020-05-18 – 2020-05-25 (×8): 81 mg
  Filled 2020-05-18 (×8): qty 1

## 2020-05-18 MED ORDER — ASPIRIN EC 81 MG PO TBEC: 81.0000 mg | DELAYED_RELEASE_TABLET | Freq: Every day | ORAL | Status: DC

## 2020-05-18 MED ORDER — SODIUM CHLORIDE 0.9 % IV SOLN: 200.0000 mg | Freq: Once | INTRAVENOUS | Status: DC

## 2020-05-18 MED ORDER — SODIUM CHLORIDE 0.9 % IV SOLN: 100.0000 mg | Freq: Every day | INTRAVENOUS | Status: DC

## 2020-05-18 MED ORDER — SODIUM CHLORIDE 0.9% FLUSH: 3.0000 mL | Freq: Two times a day (BID) | INTRAVENOUS | Status: DC

## 2020-05-18 MED ORDER — SODIUM CHLORIDE 0.9 % IV SOLN
100.0000 mg | Freq: Every day | INTRAVENOUS | Status: AC
  Administered 2020-05-19 – 2020-05-22 (×4): 100 mg via INTRAVENOUS
  Filled 2020-05-18 (×4): qty 20

## 2020-05-18 MED ORDER — DEXAMETHASONE SODIUM PHOSPHATE 10 MG/ML IJ SOLN
6.0000 mg | INTRAMUSCULAR | Status: DC
  Administered 2020-05-18: 6 mg via INTRAVENOUS
  Filled 2020-05-18: qty 1

## 2020-05-18 MED ORDER — SODIUM CHLORIDE 0.9 % IV SOLN
1000.0000 mL | INTRAVENOUS | Status: DC
  Administered 2020-05-18: 1000 mL via INTRAVENOUS

## 2020-05-18 NOTE — ED Triage Notes (Signed)
Patient coming from infusion center, covid positive. O2 was 76% on RA at infusion center, 93% on 6L. A&O x 4.

## 2020-05-18 NOTE — Progress Notes (Signed)
Pt arrived to infusion center via wheelchair.  SpO2 76% on RA. NP in infusion center notified.  Initiated O2 via [place]. Pt transported to ED via wheelchair.

## 2020-05-18 NOTE — ED Notes (Signed)
Applied condom catheter. 

## 2020-05-18 NOTE — ED Provider Notes (Signed)
[place] DEPT Provider Note   CSN: [id number] Arrival date & time: 05/18/20  1326     History Chief Complaint  Patient presents with  . Covid Positive    [name]. is a 74 y.o. male with past medical history of CAD status post CABG 2005, hypertension, hyperlipidemia who presents today for evaluation of hypoxia.  He is on day 5 of COVID-19 symptoms.  He states that he had gotten 1 vaccine in the beginning of September however was unable to get his second vaccine before he got sick.  He went today to get his monoclonal antibody infusion, was found to be hypoxic into the 70s on room air and transferred to the emergency room.  He is not on oxygen at baseline.  He denies history of smoking, COPD, asthma, or other chronic pulmonary conditions.  He denies any leg swelling.  No specific pain in his chest.  He states that at baseline he does not breathe well through his nose due to his prior maxillofacial surgeries from cancer resection.  He has not had any Tylenol or antipyretics today.  He has not been on any steroids or antibiotics recently.    HPI     Past Medical History:  Diagnosis Date  . 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 Cyndia Bent  . HTN (hypertension) 07/04/2013  . Hyperlipidemia 07/04/2013    Patient Active Problem List   Diagnosis Date Noted  . CAD s/p CABG 2005 07/04/2013  . Hyperlipidemia 07/04/2013  . HTN (hypertension) 07/04/2013  . Sinus bradycardia 07/04/2013    Past Surgical History:  Procedure Laterality Date  . CARDIAC CATHETERIZATION  01/15/2004  . CORONARY ARTERY BYPASS GRAFT  01/16/2004   CABG's x4  . HERNIA REPAIR N/A    [employer]reesia 01/27/2020  . NM MYOCAR PERF WALL MOTION  01/16/2010   EF 65%,Mets 10       No family history on file.  Social History   Tobacco Use  . Smoking status: Former Smoker    Quit date: 08/16/2005     Years since quitting: 14.7  . Smokeless tobacco: Never Used  Substance Use Topics  . Alcohol use: Yes    Comment: rarely  . Drug use: No    Home Medications Prior to Admission medications   Medication Sig Start Date End Date Taking? Authorizing Provider  aspirin EC 81 MG tablet Take 81 mg by mouth daily.    [provider]  furosemide (LASIX) 20 MG tablet Take 1 tablet (20 mg total) by mouth daily as needed for edema. 02/08/18 05/09/18  Croitoru, Mihai, MD  Omega 3 1200 MG CAPS Take by mouth. Patient not taking: Reported on 02/28/2020    [provider]  simvastatin (ZOCOR) 20 MG tablet Take 1 tablet (20 mg total) by mouth daily at 6 PM. 01/29/20   Croitoru, Mihai, MD    Allergies    Aspartame, Bupropion, and Other  Review of Systems   Review of Systems  Constitutional: Positive for chills, fatigue and fever.  Respiratory: Positive for cough and shortness of breath.   Cardiovascular: Negative for chest pain.  Gastrointestinal: Negative for abdominal pain and nausea.  Musculoskeletal: Negative for back pain and neck pain.  Neurological: Positive for weakness (Global). Negative for headaches.  Psychiatric/Behavioral: Negative for confusion.  All other systems reviewed and are negative.   Physical Exam Updated Vital Signs  BP 124/68 (BP Location: Right Arm)   Pulse 80   Temp (!) 100.7 F (38.2 C) (Oral)   Resp (!) 25   SpO2 93%   Physical Exam Vitals and nursing note reviewed.  Constitutional:      Appearance: He is well-developed. He is ill-appearing.  HENT:     Head: Atraumatic.     Comments: S/p cancer resection of oropharynx Eyes:     Conjunctiva/sclera: Conjunctivae normal.  Cardiovascular:     Rate and Rhythm: Normal rate and regular rhythm.     Heart sounds: No murmur heard.   Pulmonary:     Effort: Pulmonary effort is normal. Tachypnea present.     Breath sounds: Normal breath sounds. No decreased breath sounds.  Abdominal:     Palpations:  Abdomen is soft.     Tenderness: There is no abdominal tenderness. There is no guarding.  Musculoskeletal:     Cervical back: Normal range of motion and neck supple. No rigidity.     Right lower leg: No edema.     Left lower leg: No edema.  Skin:    General: Skin is warm and dry.  Neurological:     General: No focal deficit present.     Mental Status: He is alert. Mental status is at baseline.  Psychiatric:        Mood and Affect: Mood normal.        Behavior: Behavior normal.     ED Results / Procedures / Treatments   Labs (all labs ordered are listed, but only abnormal results are displayed) Labs Reviewed  CBC WITH DIFFERENTIAL/PLATELET - Abnormal; Notable for the following components:      Result Value   Hemoglobin 12.3 (*)    HCT 37.9 (*)    Lymphs Abs 0.5 (*)    Abs Immature Granulocytes 0.10 (*)    All other components within normal limits  COMPREHENSIVE METABOLIC PANEL - Abnormal; Notable for the following components:   BUN 42 (*)    Albumin 3.4 (*)    AST 102 (*)    ALT 63 (*)    All other components within normal limits  D-DIMER, QUANTITATIVE (NOT AT [facility]) - Abnormal; Notable for the following components:   D-Dimer, Quant 1.46 (*)    All other components within normal limits  LACTATE DEHYDROGENASE - Abnormal; Notable for the following components:   LDH 347 (*)    All other components within normal limits  FERRITIN - Abnormal; Notable for the following components:   Ferritin 573 (*)    All other components within normal limits  FIBRINOGEN - Abnormal; Notable for the following components:   Fibrinogen 674 (*)    All other components within normal limits  C-REACTIVE PROTEIN - Abnormal; Notable for the following components:   CRP 19.6 (*)    All other components within normal limits  CULTURE, BLOOD (ROUTINE X 2)  CULTURE, BLOOD (ROUTINE X 2)  LACTIC ACID, PLASMA  PROCALCITONIN  TRIGLYCERIDES  LACTIC ACID, PLASMA  CBC WITH DIFFERENTIAL/PLATELET  C-REACTIVE  PROTEIN  COMPREHENSIVE METABOLIC PANEL  D-DIMER, QUANTITATIVE (NOT AT [facility])  FERRITIN  MAGNESIUM    EKG EKG Interpretation  Date/Time:  Saturday May 18 2020 14:41:06 EDT Ventricular Rate:  74 PR Interval:    QRS Duration: 92 QT Interval:  434 QTC Calculation: 482 R Axis:   20 Text Interpretation: Sinus rhythm Low voltage, extremity and precordial leads Abnormal R-wave progression, early transition Borderline prolonged QT interval Confirmed by Nat Christen [phone])  on 05/18/2020 3:44:32 PM Also confirmed by Nat Christen [phone])  on 05/18/2020 3:45:43 PM   Radiology DG Chest Port 1 View  Result Date: 05/18/2020 CLINICAL DATA:  Patient coming from infusion center, covid positive. O2 was 76% on RA at infusion center, 93% on 6L. H/o CAD, HTN. Former smoker. EXAM: PORTABLE CHEST - 1 VIEW COMPARISON:  06/13/2019 FINDINGS: Coarse interstitial opacities in the lung bases left greater than right, perhaps slightly more conspicuous than on prior study. No new airspace consolidation. No overt edema. Heart size and mediastinal contours are within normal limits. Previous CABG. No effusion.  No pneumothorax. Previous median sternotomy. IMPRESSION: Chronic interstitial changes, left greater than right. No acute disease. Electronically Signed   By: Lucrezia Europe M.D.   On: 05/18/2020 14:27    Procedures Procedures (including critical care time)  Medications Ordered in ED Medications  0.9 %  sodium chloride infusion (has no administration in time range)  dexamethasone (DECADRON) injection 6 mg (has no administration in time range)  acetaminophen (TYLENOL) tablet 650 mg (has no administration in time range)  remdesivir 200 mg in sodium chloride 0.9% 250 mL IVPB (has no administration in time range)    Followed by  remdesivir 100 mg in sodium chloride 0.9 % 100 mL IVPB (has no administration in time range)    ED Course  I have reviewed the triage vital signs and the nursing notes.  Pertinent labs &  imaging results that were available during my care of the patient were reviewed by me and considered in my medical decision making (see chart for details).  Clinical Course as of May 18 1716  Sat May 18, 2020  1612 Tech will readjust BP cuff and recheck to verify.    [EH]  1613 BP(!): 95/56 [EH]  1620 Spoke with Dr. Neysa Bonito who will see patient.    [EH]    Clinical Course User Index [EH] Lorin Glass, PA-C   MDM Rules/Calculators/A&P                         [name] BAYLOCK Sr. was evaluated in Emergency Department on 05/18/2020 for the symptoms described in the history of present illness. He was evaluated in the context of the global COVID-19 pandemic, which necessitated consideration that the patient might be at risk for infection with the SARS-CoV-2 virus that causes COVID-19. Institutional protocols and algorithms that pertain to the evaluation of patients at risk for COVID-19 are in a state of rapid change based on information released by regulatory bodies including the CDC and federal and state organizations. These policies and algorithms were followed during the patient's care in the ED.  Patient is a 74 year old male who presents today for evaluation of hypoxia.  He went today to get a monoclonal antibody infusion and was found to be hypoxic into the 70s.  He does not wear oxygen at baseline and is on day 5 of symptoms.  He had 1 vaccine prior to getting sick.  On initial evaluation he is on 6 L nasal cannula.  RN given orders to switch to simple facemask if needed due to patient's prior maxillofacial surgeries.  Covid admission labs are ordered.  He is febrile at 100.7, Tylenol is ordered.  Remdesivir per pharmacy consult and IV Decadron are both ordered.  Chest x-ray is obtained and reviewed showing no acute abnormalities.  Labs are obtained and reviewed, mild anemia with a hemoglobin of 12.3.  CMP has transaminitis consistent  with Covid without any other acute abnormalities.   D-dimer slightly elevated at 1.46.  Blood cultures are sent.    This patient was seen as a shared visit with Dr. Lacinda Axon.  Patient will require admission. I spoke with Dr. Sonny Masters who will see patient for admission.  Note: Portions of this report may have been transcribed using voice recognition software. Every effort was made to ensure accuracy; however, inadvertent computerized transcription errors may be present   Final Clinical Impression(s) / ED Diagnoses Final diagnoses:  Acute hypoxemic respiratory failure due to COVID-19 [facility])  COVID-19 vaccine second dose not administered    Rx / DC Orders ED Discharge Orders    None       Ollen Gross 05/18/20 1719    Nat Christen, MD 05/19/20 1325

## 2020-05-18 NOTE — ED Notes (Signed)
Patients son notified of status.

## 2020-05-18 NOTE — ED Notes (Signed)
Patients son, [name], [phone].

## 2020-05-18 NOTE — Progress Notes (Signed)
[name] arrived to the infusion center for treatment with Regeneron for COVID 19. He was wheeled into the infusion center via wheelchair. Upon checking vital signs his oxygen saturation was noted to be 75% on room air. He is mentating normally and is not on oxygen at home. He continues to have significant amount of congestion. Given his hypoxia he does not qualify for treatment with Regeneron and will be transferred to the ED with the next available bed. Oxygen therapy with started with nasal cannula and titrated to 92%. He is currently on 6L and left the infusion clinic at 1:10. Son Rosalio Catterton updated and can be reached at [phone].  Terri Piedra, NP 05/18/2020  1:13 PM

## 2020-05-18 NOTE — H&P (Signed)
History and Physical        Hospital Admission Note Date: 05/18/2020  Patient name: [name]. Medical record number: [id number] Date of birth: 1945-08-20 Age: 74 y.o. Gender: male  PCP: Shade Flood, MD  Patient coming from: Home Lives with: Son and daughter-in-law At baseline, ambulates: Independently  Chief Complaint    Chief Complaint  Patient presents with  . 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  He is currently on day 5 of his COVID-19 symptoms which mainly include nausea and vomiting.    He presented to the infusion center this a.m. and was noted to have an SPO2 at 75% on room air requiring 6 L/min O2, mentating normally but noted to have significant amount of congestion, (though he does have baseline maxillofacial issues which she follows up with OMFS outpatient).  He subsequently did not receive monoclonal antibody treatment and was transferred to the ED this afternoon.  ED Course: ED vitals: T100.7, tachypneic, normotensive-> hypotensive 87/54-> 95/56, SPO2 78% on room air requiring 6 L/min O2.  Vitals:   05/18/20 1645 05/18/20 1700  BP: 103/88 99/61  Pulse: 64 (!) 56  Resp: 17 (!) 24  Temp:    SpO2: 96% 96%     Review of Systems:  Review of Systems  Constitutional: Negative for chills, fever and malaise/fatigue.  Respiratory: Negative for cough and sputum production.   Cardiovascular: Negative for chest pain and palpitations.   Gastrointestinal: Positive for nausea and vomiting. Negative for abdominal pain.       PEG tube  Genitourinary: Negative for dysuria.  Musculoskeletal: Negative for myalgias.  Skin: Negative for rash.  All other systems reviewed and are negative.   Medical/Social/Family History   Past Medical History: Past Medical History:  Diagnosis Date  . 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 Laneta Simmers  . HTN (hypertension) 07/04/2013  . Hyperlipidemia 07/04/2013    Past Surgical History:  Procedure Laterality Date  . CARDIAC CATHETERIZATION  01/15/2004  . CORONARY ARTERY BYPASS GRAFT  01/16/2004   CABG's x4  . HERNIA REPAIR N/A    [employer] 01/27/2020  . NM MYOCAR PERF WALL MOTION  01/16/2010   EF 65%,Mets 10    Medications: Prior to Admission medications   Medication Sig Start Date End Date Taking? Authorizing Provider  aspirin EC 81 MG tablet Take 81 mg by mouth daily.    [provider]  furosemide (LASIX) 20 MG tablet Take 1 tablet (20 mg total) by mouth daily as needed for edema. 02/08/18 05/09/18  Croitoru, Mihai, MD  Omega 3 1200 MG CAPS Take by mouth. Patient not taking: Reported on 02/28/2020    [provider]  simvastatin (ZOCOR) 20 MG tablet Take 1 tablet (20 mg total) by mouth daily at 6 PM. 01/29/20   Croitoru, Rachelle Hora, MD    Allergies:   Allergies  Allergen Reactions  . Aspartame Hives  . Bupropion Anxiety  . Other Hives and Itching    Social History:  reports that he quit smoking about 14 years ago. He has never used smokeless tobacco. He reports current alcohol use. He reports that he does not use drugs.  Family History: No family history on file.   Objective   Physical Exam: Blood pressure 99/61, pulse (!) 56, temperature 98.1 F (36.7 C), temperature source Oral, resp. rate (!) 24, SpO2 96 %.  Physical Exam Vitals and nursing note reviewed.  HENT:     Head:      Comments: Postsurgical oral maxillofacial changes Eyes:     Conjunctiva/sclera: Conjunctivae normal.  Cardiovascular:     Rate and Rhythm: Normal rate and regular rhythm.  Pulmonary:     Effort: Pulmonary effort is normal. No respiratory distress.  Abdominal:     General: Abdomen is flat.     Comments: PEG tube in place  Musculoskeletal:        General: No swelling or tenderness.  Skin:    General: Skin is warm.     Coloration: Skin is not jaundiced.  Neurological:     Mental Status: He is alert. Mental status is at baseline.  Psychiatric:        Mood and Affect: Mood normal.        Behavior: Behavior normal.     LABS on Admission: I have personally reviewed all the labs and imaging below    Basic Metabolic Panel: Recent Labs  Lab 05/18/20 1355  NA 144  K 3.9  CL 103  CO2 27  GLUCOSE 95  BUN 42*  CREATININE 0.79  CALCIUM 9.1   Liver Function Tests: Recent Labs  Lab 05/18/20 1355  AST 102*  ALT 63*  ALKPHOS 81  BILITOT 1.0  PROT 7.8  ALBUMIN 3.4*   No results for input(s): LIPASE, AMYLASE in the last 168 hours. No results for input(s): AMMONIA in the last 168 hours. CBC: Recent Labs  Lab 05/18/20 1355  WBC 6.7  NEUTROABS 5.6  HGB 12.3*  HCT 37.9*  MCV 89.4  PLT 173   Cardiac Enzymes: No results for input(s): CKTOTAL, CKMB, CKMBINDEX, TROPONINI in the last 168 hours. BNP: Invalid input(s): POCBNP CBG: No results for input(s): GLUCAP in the last 168 hours.  Radiological Exams on Admission:  DG Chest Port 1 View  Result Date: 05/18/2020 CLINICAL DATA:  Patient coming from infusion center, covid positive. O2 was 76% on RA at infusion center, 93% on 6L. H/o CAD, HTN. Former smoker. EXAM: PORTABLE CHEST - 1 VIEW COMPARISON:  06/13/2019 FINDINGS: Coarse interstitial opacities in the lung bases left greater than right, perhaps slightly more conspicuous than on prior study. No new airspace consolidation. No overt edema. Heart size and mediastinal  contours are within normal limits. Previous CABG. No effusion.  No pneumothorax. Previous median sternotomy. IMPRESSION: Chronic interstitial changes, left greater than right. No acute disease. Electronically Signed   By: Corlis Leak M.D.   On: 05/18/2020 14:27      EKG: Independently reviewed.  Nonspecific T wave inversion in lead III   A & P   Principal Problem:   Acute hypoxemic respiratory failure due to COVID-19 [facility]) Active Problems:   CAD s/p CABG 2005   Hyperlipidemia  Hypotension   1. Acute hypoxic respiratory failure secondary to COVID-19 O2 Requirements: 0 L/min at baseline, currently 6 L/min CRP 19, D Dimer 1.46, Procalcitonin negative - hold off antibiotics for now Remdesivir x 5 days, Steroids x 10 days Nebulizers, antitussives Encourage Incentive Spirometry, Flutter Valve and Pronation as tolerated  2. Hypotension a. Currently asymptomatic and MAP > 65 b. Received IV fluids in the ED c. Continue to monitor  3. History of CAD s/p CABG a. Continue home statin and aspirin   DVT prophylaxis: Lovenox   Code Status: Full Code  Diet: Tube feeds Family Communication: Admission, patients condition and plan of care including tests being ordered have been discussed with the patient who indicates understanding and agrees with the plan and Code Status. Patient's son was called but did not answer  Disposition Plan: The appropriate patient status for this patient is INPATIENT. Inpatient status is judged to be reasonable and necessary in order to provide the required intensity of service to ensure the patient's safety. The patient's presenting symptoms, physical exam findings, and initial radiographic and laboratory data in the context of their chronic comorbidities is felt to place them at high risk for further clinical deterioration. Furthermore, it is not anticipated that the patient will be medically stable for discharge from the hospital within 2 midnights of admission. The  following factors support the patient status of inpatient.   " The patient's presenting symptoms include hypoxia. " The worrisome physical exam findings include hypoxia. " The initial radiographic and laboratory data are worrisome because of elevated inflammatory markers. " The chronic co-morbidities include PEG tube.   * I certify that at the point of admission it is my clinical judgment that the patient will require inpatient hospital care spanning beyond 2 midnights from the point of admission due to high intensity of service, high risk for further deterioration and high frequency of surveillance required.*   Consultants  . None  Procedures  . None  Time Spent on Admission: 66 minutes    Jae Dire, DO [facility]  05/18/2020, 5:21 PM

## 2020-05-19 LAB — CBC WITH DIFFERENTIAL/PLATELET
Abs Immature Granulocytes: 0.06 10*3/uL (ref 0.00–0.07)
Basophils Absolute: 0 10*3/uL (ref 0.0–0.1)
Basophils Relative: 0 %
Eosinophils Absolute: 0 10*3/uL (ref 0.0–0.5)
Eosinophils Relative: 0 %
HCT: 32.4 % — ABNORMAL LOW (ref 39.0–52.0)
Hemoglobin: 10.4 g/dL — ABNORMAL LOW (ref 13.0–17.0)
Immature Granulocytes: 2 %
Lymphocytes Relative: 13 %
Lymphs Abs: 0.5 10*3/uL — ABNORMAL LOW (ref 0.7–4.0)
MCH: 28.9 pg (ref 26.0–34.0)
MCHC: 32.1 g/dL (ref 30.0–36.0)
MCV: 90 fL (ref 80.0–100.0)
Monocytes Absolute: 0.1 10*3/uL (ref 0.1–1.0)
Monocytes Relative: 4 %
Neutro Abs: 3 10*3/uL (ref 1.7–7.7)
Neutrophils Relative %: 81 %
Platelets: 148 10*3/uL — ABNORMAL LOW (ref 150–400)
RBC: 3.6 MIL/uL — ABNORMAL LOW (ref 4.22–5.81)
RDW: 14.6 % (ref 11.5–15.5)
WBC: 3.7 10*3/uL — ABNORMAL LOW (ref 4.0–10.5)
nRBC: 0 % (ref 0.0–0.2)

## 2020-05-19 LAB — COMPREHENSIVE METABOLIC PANEL
ALT: 46 U/L — ABNORMAL HIGH (ref 0–44)
AST: 65 U/L — ABNORMAL HIGH (ref 15–41)
Albumin: 2.5 g/dL — ABNORMAL LOW (ref 3.5–5.0)
Alkaline Phosphatase: 59 U/L (ref 38–126)
Anion gap: 9 (ref 5–15)
BUN: 38 mg/dL — ABNORMAL HIGH (ref 8–23)
CO2: 23 mmol/L (ref 22–32)
Calcium: 7.8 mg/dL — ABNORMAL LOW (ref 8.9–10.3)
Chloride: 113 mmol/L — ABNORMAL HIGH (ref 98–111)
Creatinine, Ser: 0.63 mg/dL (ref 0.61–1.24)
GFR calc Af Amer: >60 mL/min (ref >60)
GFR calc non Af Amer: >60 mL/min (ref >60)
Glucose, Bld: 119 mg/dL — ABNORMAL HIGH (ref 70–99)
Potassium: 3.4 mmol/L — ABNORMAL LOW (ref 3.5–5.1)
Sodium: 145 mmol/L (ref 135–145)
Total Bilirubin: 0.9 mg/dL (ref 0.3–1.2)
Total Protein: 6.1 g/dL — ABNORMAL LOW (ref 6.5–8.1)

## 2020-05-19 LAB — MAGNESIUM: Magnesium: 2 mg/dL (ref 1.7–2.4)

## 2020-05-19 LAB — C-REACTIVE PROTEIN: CRP: 16 mg/dL — ABNORMAL HIGH (ref <1.0)

## 2020-05-19 LAB — FERRITIN: Ferritin: 454 ng/mL — ABNORMAL HIGH (ref 24–336)

## 2020-05-19 LAB — D-DIMER, QUANTITATIVE: D-Dimer, Quant: 0.94 ug/mL-FEU — ABNORMAL HIGH (ref 0.00–0.50)

## 2020-05-19 MED ORDER — METHYLPREDNISOLONE SODIUM SUCC 125 MG IJ SOLR
60.0000 mg | Freq: Two times a day (BID) | INTRAMUSCULAR | Status: DC
  Administered 2020-05-19 – 2020-05-25 (×13): 60 mg via INTRAVENOUS
  Filled 2020-05-19 (×13): qty 2

## 2020-05-19 MED ORDER — POTASSIUM CHLORIDE 20 MEQ PO PACK
40.0000 meq | PACK | Freq: Two times a day (BID) | ORAL | Status: AC
  Administered 2020-05-19 – 2020-05-21 (×4): 40 meq via ORAL
  Filled 2020-05-19 (×4): qty 2

## 2020-05-19 MED ORDER — VITAL HIGH PROTEIN PO LIQD: 1000.0000 mL | ORAL | Status: DC

## 2020-05-19 MED ORDER — JEVITY 1.2 CAL PO LIQD
237.0000 mL | Freq: Four times a day (QID) | ORAL | Status: DC
  Administered 2020-05-19 – 2020-05-20 (×5): 237 mL
  Filled 2020-05-19 (×5): qty 237

## 2020-05-19 NOTE — ED Notes (Signed)
CBG 110. 

## 2020-05-19 NOTE — ED Notes (Signed)
Patient on the phone with family member at this time.

## 2020-05-19 NOTE — ED Notes (Signed)
Patient is resting comfortably. 

## 2020-05-19 NOTE — Progress Notes (Signed)
PROGRESS NOTE    [name]  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 Diagnosed with COVID-19 on 9/29.  Went to infusion center for Regeneron infusion, however he was found to be 75% on room air so transferred to ER. Patient had received his first dose of COVID-19 vaccine, and he was due for next dose.  Assessment & Plan:   Principal Problem:   Acute hypoxemic respiratory failure due to COVID-19 [facility]) Active Problems:   CAD s/p CABG 2005   Hyperlipidemia   Hypotension  Acute hypoxemic respiratory failure due to COVID-19 virus infection in a patient with head and neck deformity: Continue to monitor due to significant symptoms, remains on 6 to 8 L of oxygen. chest physiotherapy, incentive spirometry, deep breathing exercises, sputum induction, mucolytic's and bronchodilators. Patient does have surgical deformity of the nose and mouth along with the throat, difficult to participate on physical therapy and chest physio, however will continue as much as he can do. Supplemental oxygen to keep saturations more than 85 %. Covid directed therapy with , steroids, on dexamethasone, will change to Solu-Medrol. remdesivir, day 2/5 antibiotics, not indicated at this time. Due to severity of symptoms, patient will need daily inflammatory markers, chest x-rays, liver function test to monitor and direct COVID-19 therapies.  COVID-19 Labs  Recent Labs    05/18/20 1355 05/19/20 0421  DDIMER 1.46* 0.94*  FERRITIN 573* 454*  LDH 347*  --   CRP 19.6* 16.0*    No results found for: SARSCOV2NAA, reported  positive test on 9/29 outside.  SpO2: 91 % O2 Flow Rate (L/min): 8 L/min  Dysphagia secondary to surgical deformity: Has a functioning PEG tube.  Nutrition consulted.  Patient needs to be started on nutrition and medications. Start using PEG tube with Jevity 237 every 6 hours, will follow nutrition recommendation. At home, patient is using Nepro 8 ounces twice a day and TwoCal 8 ounce twice a day total 4 times bolus feeding.  He does use water to rinse his mouth and spit, will allow.  Coronary artery disease status post CABG: Continue on aspirin and statin from home.   DVT prophylaxis: enoxaparin (LOVENOX) injection 40 mg Start: 05/18/20 1715   Code Status: Full code Family Communication: Patient's son on the phone Disposition Plan: Status is: Inpatient  Remains inpatient appropriate because:Inpatient level of care appropriate due to severity of illness   Dispo: The patient is from: Home              Anticipated d/c is to: Home              Anticipated d/c date is: 3 days              Patient currently is not medically stable to d/c.         Consultants:   None  Procedures:   None  Antimicrobials:   Remdesivir, 10/2----   Subjective: Patient seen and examined.  He was quite upset about not getting anything to drink all night.  Otherwise he himself denies any complaints.  Patient stated some congestion of his throat.  Denies any shortness of breath, however he was on 6 L  of oxygen on my examination.  Objective: Vitals:   05/19/20 1338 05/19/20 1342 05/19/20 1400 05/19/20 1431  BP:   120/62   Pulse: 73 73 71 72  Resp: (!) 24 (!) 29 (!) 26 (!) 22  Temp:      TempSrc:      SpO2: 90% (!) 89% (!) 89% 91%    Intake/Output Summary (Last 24 hours) at 05/19/2020 1440 Last data filed at 05/18/2020 1939 Gross per 24 hour  Intake 1250 ml  Output --  Net 1250 ml   There were no vitals filed for this visit.  Examination:  General exam: Appears calm, comfortable  on 6 L, talking in full sentences.  Difficult phonation due to surgical deformity however he was able to express and we were able to understand his sentences. Respiratory system: Clear to auscultation. Respiratory effort normal.  Mostly conducted airway sounds. Cardiovascular system: S1 & S2 heard, RRR. No JVD, murmurs, rubs, gallops or clicks. No pedal edema. Gastrointestinal system: Abdomen is nondistended, soft and nontender. No organomegaly or masses felt. Normal bowel sounds heard. PEG tube in place clean and intact. Central nervous system: Alert and oriented. No focal neurological deficits. Extremities: Symmetric 5 x 5 power. Skin: No rashes, lesions or ulcers Psychiatry: Judgement and insight appear normal. Mood & affect appropriate.     Data Reviewed: I have personally reviewed following labs and imaging studies  CBC: Recent Labs  Lab 05/18/20 1355 05/19/20 0421  WBC 6.7 3.7*  NEUTROABS 5.6 3.0  HGB 12.3* 10.4*  HCT 37.9* 32.4*  MCV 89.4 90.0  PLT 173 148*   Basic Metabolic Panel: Recent Labs  Lab 05/18/20 1355 05/19/20 0421  NA 144 145  K 3.9 3.4*  CL 103 113*  CO2 27 23  GLUCOSE 95 119*  BUN 42* 38*  CREATININE 0.79 0.63  CALCIUM 9.1 7.8*  MG  --  2.0   GFR: CrCl cannot be calculated (Unknown ideal weight.). Liver Function Tests: Recent Labs  Lab 05/18/20 1355 05/19/20 0421  AST 102* 65*  ALT 63* 46*  ALKPHOS 81 59  BILITOT 1.0 0.9  PROT 7.8 6.1*  ALBUMIN 3.4* 2.5*   No results for input(s): LIPASE, AMYLASE in the last 168 hours. No results for input(s): AMMONIA in the last 168 hours. Coagulation Profile: No results for input(s): INR, PROTIME in the last 168 hours. Cardiac Enzymes: No results for input(s): CKTOTAL, CKMB, CKMBINDEX, TROPONINI in the last 168 hours. BNP (last 3 results) No results for input(s): PROBNP in the last 8760 hours. HbA1C: No results for input(s): HGBA1C in the last 72 hours. CBG: No results for input(s): GLUCAP in  the last 168 hours. Lipid Profile: Recent Labs    05/18/20 1355  TRIG 44   Thyroid Function Tests: No results for input(s): TSH, T4TOTAL, FREET4, T3FREE, THYROIDAB in the last 72 hours. Anemia Panel: Recent Labs    05/18/20 1355 05/19/20 0421  FERRITIN 573* 454*   Sepsis Labs: Recent Labs  Lab 05/18/20 1355  PROCALCITON <0.10  LATICACIDVEN 1.9    Recent Results (from the past 240 hour(s))  Blood Culture (routine x 2)     Status: None (Preliminary result)   Collection Time: 05/18/20  1:55 PM   Specimen: BLOOD LEFT WRIST  Result Value Ref Range Status   Specimen Description   Final    BLOOD LEFT WRIST Performed at [facility], [address], [place], [place] [address]    Special Requests   Final  BOTTLES DRAWN AEROBIC AND ANAEROBIC Blood Culture adequate volume Performed at [facility], [address] [place], [place] [address]    Culture   Final    NO GROWTH < 24 HOURS Performed at [facility], [address], [place], [place] [address]    Report Status PENDING  Incomplete  Blood Culture (routine x 2)     Status: None (Preliminary result)   Collection Time: 05/18/20  2:00 PM   Specimen: BLOOD LEFT FOREARM  Result Value Ref Range Status   Specimen Description   Final    BLOOD LEFT FOREARM Performed at [facility], [address], [place], [place] [address]    Special Requests   Final    BOTTLES DRAWN AEROBIC AND ANAEROBIC Blood Culture adequate volume Performed at [facility], [address], [place], [place] [address]    Culture   Final    NO GROWTH < 24 HOURS Performed at [facility], [address], [place], [place] [address]    Report Status PENDING  Incomplete         Radiology Studies: DG Chest Port 1 View  Result Date: 05/18/2020 CLINICAL DATA:  Patient coming from infusion center, covid positive. O2 was 76% on RA at infusion center, 93% on 6L. H/o CAD, HTN. Former smoker.  EXAM: PORTABLE CHEST - 1 VIEW COMPARISON:  06/13/2019 FINDINGS: Coarse interstitial opacities in the lung bases left greater than right, perhaps slightly more conspicuous than on prior study. No new airspace consolidation. No overt edema. Heart size and mediastinal contours are within normal limits. Previous CABG. No effusion.  No pneumothorax. Previous median sternotomy. IMPRESSION: Chronic interstitial changes, left greater than right. No acute disease. Electronically Signed   By: Corlis Leak M.D.   On: 05/18/2020 14:27        Scheduled Meds: . albuterol  1 puff Inhalation Q6H  . aspirin  81 mg Per Tube Daily  . enoxaparin (LOVENOX) injection  40 mg Subcutaneous Q24H  . feeding supplement (JEVITY 1.2 CAL)  237 mL Per Tube Q6H  . methylPREDNISolone (SOLU-MEDROL) injection  60 mg Intravenous Q12H  . potassium chloride  40 mEq Oral BID  . simvastatin  20 mg Oral q1800   Continuous Infusions: . remdesivir 100 mg in NS 100 mL Stopped (05/19/20 1151)     LOS: 1 day    Time spent: 40 minutes    Dorcas Carrow, MD [facility] Pager [phone]

## 2020-05-19 NOTE — ED Notes (Addendum)
Patient transferred to hospital bed for comfort. Provided with fresh linen, bed pad, and blankets. Patient stood and pivoted to bed with assistance. Patient denies SOB. Speaking in full sentences without difficulty.

## 2020-05-19 NOTE — ED Notes (Addendum)
Patient alert and oriented. Remains on 8L simple mask. Oxygen saturation drops to 82% when conversing, otherwise remains at 88-91%. Provided with fresh water.

## 2020-05-19 NOTE — ED Notes (Addendum)
MD made aware of trending oxygen saturation. Per Dr. Sloan Leiter, oxygen saturation goal is 85%.

## 2020-05-19 NOTE — ED Notes (Signed)
Assumed care of patient at this time, nad noted, sr up x2, bed locked and low, call bell w/I reach.  Will continue to monitor. ° °

## 2020-05-19 NOTE — ED Notes (Signed)
CBG 137

## 2020-05-20 ENCOUNTER — Other Ambulatory Visit: Payer: Self-pay

## 2020-05-20 ENCOUNTER — Encounter (HOSPITAL_COMMUNITY): Payer: Self-pay | Admitting: Internal Medicine

## 2020-05-20 LAB — C-REACTIVE PROTEIN: CRP: 10.1 mg/dL — ABNORMAL HIGH (ref <1.0)

## 2020-05-20 LAB — GLUCOSE, CAPILLARY
Glucose-Capillary: 93 mg/dL (ref 70–99)
Glucose-Capillary: 137 mg/dL — ABNORMAL HIGH (ref 70–99)
Glucose-Capillary: 110 mg/dL — ABNORMAL HIGH (ref 70–99)
Glucose-Capillary: 145 mg/dL — ABNORMAL HIGH (ref 70–99)
Glucose-Capillary: 130 mg/dL — ABNORMAL HIGH (ref 70–99)
Glucose-Capillary: 115 mg/dL — ABNORMAL HIGH (ref 70–99)
Glucose-Capillary: 105 mg/dL — ABNORMAL HIGH (ref 70–99)
Glucose-Capillary: 113 mg/dL — ABNORMAL HIGH (ref 70–99)

## 2020-05-20 LAB — CBC WITH DIFFERENTIAL/PLATELET
Abs Immature Granulocytes: 0.16 10*3/uL — ABNORMAL HIGH (ref 0.00–0.07)
Basophils Absolute: 0 10*3/uL (ref 0.0–0.1)
Basophils Relative: 0 %
Eosinophils Absolute: 0 10*3/uL (ref 0.0–0.5)
Eosinophils Relative: 0 %
HCT: 39.3 % (ref 39.0–52.0)
Hemoglobin: 12.7 g/dL — ABNORMAL LOW (ref 13.0–17.0)
Immature Granulocytes: 2 %
Lymphocytes Relative: 8 %
Lymphs Abs: 0.7 10*3/uL (ref 0.7–4.0)
MCH: 28.7 pg (ref 26.0–34.0)
MCHC: 32.3 g/dL (ref 30.0–36.0)
MCV: 88.9 fL (ref 80.0–100.0)
Monocytes Absolute: 0.5 10*3/uL (ref 0.1–1.0)
Monocytes Relative: 6 %
Neutro Abs: 7.2 10*3/uL (ref 1.7–7.7)
Neutrophils Relative %: 84 %
Platelets: UNDETERMINED 10*3/uL (ref 150–400)
RBC: 4.42 MIL/uL (ref 4.22–5.81)
RDW: 14.6 % (ref 11.5–15.5)
WBC: 8.5 10*3/uL (ref 4.0–10.5)
nRBC: 0.4 % — ABNORMAL HIGH (ref 0.0–0.2)

## 2020-05-20 LAB — COMPREHENSIVE METABOLIC PANEL
ALT: 51 U/L — ABNORMAL HIGH (ref 0–44)
AST: 71 U/L — ABNORMAL HIGH (ref 15–41)
Albumin: 2.7 g/dL — ABNORMAL LOW (ref 3.5–5.0)
Alkaline Phosphatase: 66 U/L (ref 38–126)
Anion gap: 8 (ref 5–15)
BUN: 30 mg/dL — ABNORMAL HIGH (ref 8–23)
CO2: 21 mmol/L — ABNORMAL LOW (ref 22–32)
Calcium: 7.9 mg/dL — ABNORMAL LOW (ref 8.9–10.3)
Chloride: 115 mmol/L — ABNORMAL HIGH (ref 98–111)
Creatinine, Ser: 0.58 mg/dL — ABNORMAL LOW (ref 0.61–1.24)
GFR calc Af Amer: >60 mL/min (ref >60)
GFR calc non Af Amer: >60 mL/min (ref >60)
Glucose, Bld: 143 mg/dL — ABNORMAL HIGH (ref 70–99)
Potassium: 3.9 mmol/L (ref 3.5–5.1)
Sodium: 144 mmol/L (ref 135–145)
Total Bilirubin: 0.5 mg/dL (ref 0.3–1.2)
Total Protein: 6.4 g/dL — ABNORMAL LOW (ref 6.5–8.1)

## 2020-05-20 LAB — MAGNESIUM: Magnesium: 2.4 mg/dL (ref 1.7–2.4)

## 2020-05-20 LAB — D-DIMER, QUANTITATIVE: D-Dimer, Quant: 0.89 ug/mL-FEU — ABNORMAL HIGH (ref 0.00–0.50)

## 2020-05-20 LAB — FERRITIN: Ferritin: 509 ng/mL — ABNORMAL HIGH (ref 24–336)

## 2020-05-20 MED ORDER — NEPRO/CARBSTEADY PO LIQD
237.0000 mL | Freq: Two times a day (BID) | ORAL | Status: DC
  Administered 2020-05-21 – 2020-05-24 (×8): 237 mL
  Filled 2020-05-20 (×11): qty 237

## 2020-05-20 MED ORDER — PANTOPRAZOLE SODIUM 40 MG PO PACK
40.0000 mg | PACK | Freq: Two times a day (BID) | ORAL | Status: DC
  Administered 2020-05-20 – 2020-05-25 (×10): 40 mg
  Filled 2020-05-20 (×13): qty 20

## 2020-05-20 MED ORDER — JEVITY 1.2 CAL PO LIQD
237.0000 mL | Freq: Four times a day (QID) | ORAL | Status: DC
  Administered 2020-05-20 – 2020-05-21 (×3): 237 mL
  Filled 2020-05-20 (×3): qty 237

## 2020-05-20 MED ORDER — PANTOPRAZOLE SODIUM 40 MG PO TBEC: 40.0000 mg | DELAYED_RELEASE_TABLET | Freq: Two times a day (BID) | ORAL | Status: DC

## 2020-05-20 MED ORDER — JEVITY 1.5 CAL/FIBER PO LIQD
800.0000 mL | ORAL | Status: DC
  Filled 2020-05-20 (×3): qty 1000

## 2020-05-20 MED ORDER — ALBUTEROL SULFATE HFA 108 (90 BASE) MCG/ACT IN AERS: 1.0000 | INHALATION_SPRAY | Freq: Four times a day (QID) | RESPIRATORY_TRACT | Status: DC | PRN

## 2020-05-20 NOTE — Progress Notes (Signed)
Patient is refusing for this nurse to administer Jevity at 42mL/hr continuous feed from 2100-0700. Patient saying he do not take that much Jevity and he wants it the way he takes it at home. This nurse informed patient that I would consult with on-call attending and update after a response. On-call attending M.Sharlet Salina notified via AMION, new orders will be given and have dietician and MD make adjustments on day shift. Will continue to monitor the patient.

## 2020-05-20 NOTE — Progress Notes (Signed)
Patient reports that he uses Nepro for tube feeding. States that his home feeding routine is 1 can of nepro at 11 AM  (bolus feed) and 3 cans of nepro at 8-9PM  (continous feed) and 80 cc/hr. MD Ghimire consulted dietician. Patient does not prefer to have the scheduled Jevity q6h.

## 2020-05-20 NOTE — Progress Notes (Signed)
PROGRESS NOTE    [name] Center Sr.  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 Diagnosed with COVID-19 on 9/29.  Went to infusion center for Regeneron infusion, however he was found to be 75% on room air so transferred to ER. Patient had received his first dose of COVID-19 vaccine, and he was due for next dose.  Assessment & Plan:   Principal Problem:   Acute hypoxemic respiratory failure due to COVID-19 [facility]) Active Problems:   CAD s/p CABG 2005   Hyperlipidemia   Hypotension  Acute hypoxemic respiratory failure due to COVID-19 virus infection in a patient with head and neck deformity: Continue to monitor due to significant symptoms, still on significant oxygen. chest physiotherapy, incentive spirometry, deep breathing exercises, sputum induction, mucolytic's and bronchodilators. Patient does have surgical deformity of the nose and mouth along with the throat, difficult to participate on physical therapy and chest physio, however will continue as much as he can do. Supplemental oxygen to keep saturations more than 85 %. Covid directed therapy with , steroids, on high-dose Solu-Medrol. remdesivir, day 3/5 antibiotics, not indicated at this time. Due to severity of symptoms, patient will need daily inflammatory markers, chest x-rays, liver function test to monitor and direct COVID-19 therapies. Mobilize today.  COVID-19 Labs  Recent Labs    05/18/20 1355 05/19/20 0421 05/20/20 0331  DDIMER 1.46* 0.94* 0.89*  FERRITIN 573* 454* 509*  LDH 347*  --   --   CRP 19.6* 16.0* 10.1*    No results found  for: SARSCOV2NAA, reported positive test on 9/29 outside.  SpO2: (!) 88 % O2 Flow Rate (L/min): 10 L/min  Dysphagia secondary to surgical deformity: Has a functioning PEG tube.  Started on PEG tube feeding.  Will have nutrition consult.  Coronary artery disease status post CABG: Continue on aspirin and statin from home.   DVT prophylaxis: enoxaparin (LOVENOX) injection 40 mg Start: 05/18/20 1715   Code Status: Full code Family Communication: Patient's son on the phone, called and updated. Disposition Plan: Status is: Inpatient  Remains inpatient appropriate because:Inpatient level of care appropriate due to severity of illness   Dispo: The patient is from: Home              Anticipated d/c is to: Home              Anticipated d/c date is: 3 days              Patient currently is not medically stable to d/c.         Consultants:   None  Procedures:   None  Antimicrobials:   Remdesivir, 10/2----   Subjective: Patient seen and examined.  Today he has no complaints.  He is on about 8 L oxygen, however looks comfortable and able to communicate well.  Afebrile overnight.  Objective: Vitals:   05/20/20 0812 05/20/20 0820 05/20/20 0825 05/20/20 1211  BP: (!) 146/73   134/75  Pulse: 67   66  Resp: 19   19  Temp: 97.8 F (36.6 C)   98 F (36.7 C)  TempSrc: Oral   Oral  SpO2: (!) 76% (!) 85% 93% (!) 88%  Weight: 72.1 kg     Height: 5\' 10"  (1.778 m)       Intake/Output Summary (Last 24 hours) at 05/20/2020 1442 Last data filed at 05/20/2020 1233 Gross per 24 hour  Intake 457 ml  Output 300 ml  Net 157 ml   Filed Weights   05/20/20 0812  Weight: 72.1 kg    Examination:  General exam: Appears calm and comfortable on 8 L oxygen.  He has some difficulty phonation due to postsurgical deformity.  Respiratory system: Clear to auscultation. Respiratory effort normal.  Mostly conducted airway sounds. Cardiovascular system: S1 & S2 heard, RRR. No JVD, murmurs,  rubs, gallops or clicks. No pedal edema. Gastrointestinal system: Abdomen is nondistended, soft and nontender. No organomegaly or masses felt. Normal bowel sounds heard. PEG tube in place clean and intact. Central nervous system: Alert and oriented. No focal neurological deficits. Extremities: Symmetric 5 x 5 power. Skin: No rashes, lesions or ulcers Psychiatry: Judgement and insight appear normal. Mood & affect appropriate.     Data Reviewed: I have personally reviewed following labs and imaging studies  CBC: Recent Labs  Lab 05/18/20 1355 05/19/20 0421 05/20/20 0331  WBC 6.7 3.7* 8.5  NEUTROABS 5.6 3.0 7.2  HGB 12.3* 10.4* 12.7*  HCT 37.9* 32.4* 39.3  MCV 89.4 90.0 88.9  PLT 173 148* PLATELET CLUMPS NOTED ON SMEAR, UNABLE TO ESTIMATE   Basic Metabolic Panel: Recent Labs  Lab 05/18/20 1355 05/19/20 0421 05/20/20 0331  NA 144 145 144  K 3.9 3.4* 3.9  CL 103 113* 115*  CO2 27 23 21*  GLUCOSE 95 119* 143*  BUN 42* 38* 30*  CREATININE 0.79 0.63 0.58*  CALCIUM 9.1 7.8* 7.9*  MG  --  2.0 2.4   GFR: Estimated Creatinine Clearance: 85.1 mL/min (A) (by C-G formula based on SCr of 0.58 mg/dL (L)). Liver Function Tests: Recent Labs  Lab 05/18/20 1355 05/19/20 0421 05/20/20 0331  AST 102* 65* 71*  ALT 63* 46* 51*  ALKPHOS 81 59 66  BILITOT 1.0 0.9 0.5  PROT 7.8 6.1* 6.4*  ALBUMIN 3.4* 2.5* 2.7*   No results for input(s): LIPASE, AMYLASE in the last 168 hours. No results for input(s): AMMONIA in the last 168 hours. Coagulation Profile: No results for input(s): INR, PROTIME in the last 168 hours. Cardiac Enzymes: No results for input(s): CKTOTAL, CKMB, CKMBINDEX, TROPONINI in the last 168 hours. BNP (last 3 results) No results for input(s): PROBNP in the last 8760 hours. HbA1C: No results for input(s): HGBA1C in the last 72 hours. CBG: Recent Labs  Lab 05/19/20 1258 05/19/20 1642 05/19/20 2106 05/20/20 0847 05/20/20 1110  GLUCAP 93 137* 110* 145* 130*    Lipid Profile: Recent Labs    05/18/20 1355  TRIG 44   Thyroid Function Tests: No results for input(s): TSH, T4TOTAL, FREET4, T3FREE, THYROIDAB in the last 72 hours. Anemia Panel: Recent Labs    05/19/20 0421 05/20/20 0331  FERRITIN 454* 509*   Sepsis Labs: Recent Labs  Lab 05/18/20 1355  PROCALCITON <0.10  LATICACIDVEN 1.9    Recent Results (from the past 240 hour(s))  Blood Culture (routine x 2)     Status: None (Preliminary result)   Collection Time: 05/18/20  1:55 PM   Specimen: BLOOD LEFT WRIST  Result Value Ref Range Status   Specimen Description   Final    BLOOD LEFT WRIST Performed at [facility], [address] [place], [place] [address]    Special Requests  Final    BOTTLES DRAWN AEROBIC AND ANAEROBIC Blood Culture adequate volume Performed at [facility], [address], [place], [place] [address]    Culture   Final    NO GROWTH 2 DAYS Performed at [facility], [address] [place], [place] [address]    Report Status PENDING  Incomplete  Blood Culture (routine x 2)     Status: None (Preliminary result)   Collection Time: 05/18/20  2:00 PM   Specimen: BLOOD LEFT FOREARM  Result Value Ref Range Status   Specimen Description   Final    BLOOD LEFT FOREARM Performed at [facility], [address] [place], [place] [address]    Special Requests   Final    BOTTLES DRAWN AEROBIC AND ANAEROBIC Blood Culture adequate volume Performed at [facility], [address] [place], [place] [address]    Culture   Final    NO GROWTH 2 DAYS Performed at [facility], [address], [place], [place] [address]    Report Status PENDING  Incomplete         Radiology Studies: No results found.      Scheduled Meds: . aspirin  81 mg Per Tube Daily  . enoxaparin (LOVENOX) injection  40 mg Subcutaneous Q24H  . feeding supplement (JEVITY 1.5 CAL/FIBER)  800 mL Per Tube Q24H  . feeding  supplement (NEPRO CARB STEADY)  237 mL Per Tube BID BM  . methylPREDNISolone (SOLU-MEDROL) injection  60 mg Intravenous Q12H  . potassium chloride  40 mEq Oral BID  . simvastatin  20 mg Oral q1800   Continuous Infusions: . remdesivir 100 mg in NS 100 mL Stopped (05/20/20 1110)     LOS: 2 days    Time spent: 40 minutes    Dorcas Carrow, MD [facility] Pager [phone]

## 2020-05-20 NOTE — ED Notes (Signed)
Patient assisted on and off the bedside commode.

## 2020-05-20 NOTE — Plan of Care (Signed)
Progressing

## 2020-05-20 NOTE — Progress Notes (Signed)
Initial Nutrition Assessment  DOCUMENTATION CODES:   Not applicable  INTERVENTION:   Resume home TF regimen: -237 ml Nepro via PEG BID -Free water of 30 ml before and after each bolus -Continuous feed of Jevity 1.5 @ 80 ml/hr x 10 hours (2100-0700) via PEG -This regimen provides 2040 kcals, 89g protein and 952 ml H2O.   NUTRITION DIAGNOSIS:   Increased nutrient needs related to acute illness (COVID-19 infection) as evidenced by estimated needs.  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  Went to infusion center for Regeneron infusion, however he was found to be 75% on room air so transferred to ER.Patient had received his first dose of COVID-19 vaccine, and he was due for next dose.  Spoke with pt, a little hard to understand at times but RD was able to gather information. Pt with h/o surgical resection of oropharynx d/t cancer.  PTA pt was having N/V r/t COVID-19 infection.  Seems to be some confusion as to what his home TF regimen is.   Per his report to RD: ~10-11 AM: 8 oz Nepro ~6 PM: 8 oz Nepro Continuous feeds of 3 x 8oz cartons (240 ml) Two Cal HN @ 80 ml/hr over 9 hours.  TwoCal is not available via our formulary, pt was agreeable to receiving Jevity 1.5 instead at night. Pt was unable to tell me why he was prescribed Nepro to use. Offered to just have a continuous infusion running while admitted and pt preferred to not be on  the pump all day, RD agreed to order the regimen he was used to.   Given that TwoCal is not available, adjusted timing of night feeds of Jevity 1.5 to better meet estimated needs, including 2 boluses a day of Nepro.   Per weight records, weight has remained stable.  Medications reviewed. Labs reviewed: CBGs: 130-145  NUTRITION - FOCUSED PHYSICAL EXAM:  Unable to complete  Diet Order:   Diet Order            Diet NPO time specified Except for: Sips with Meds  Diet effective now                 EDUCATION NEEDS:   Education needs have been addressed  Skin:  Skin Assessment: Reviewed RN Assessment  Last BM:  10/4  Height:   Ht Readings from Last 1 Encounters:  05/20/20 5\' 10"  (1.778 m)    Weight:   Wt Readings from Last 1 Encounters:  05/20/20 72.1 kg   BMI:  Body mass index is 22.81 kg/m.  Estimated Nutritional Needs:   Kcal:  2000-2200  Protein:  100-115g  Fluid:  2L/day  Clayton Bibles, MS, RD, LDN Inpatient Clinical Dietitian Contact information available via Amion

## 2020-05-21 LAB — GLUCOSE, CAPILLARY
Glucose-Capillary: 127 mg/dL — ABNORMAL HIGH (ref 70–99)
Glucose-Capillary: 131 mg/dL — ABNORMAL HIGH (ref 70–99)
Glucose-Capillary: 146 mg/dL — ABNORMAL HIGH (ref 70–99)
Glucose-Capillary: 150 mg/dL — ABNORMAL HIGH (ref 70–99)
Glucose-Capillary: 151 mg/dL — ABNORMAL HIGH (ref 70–99)
Glucose-Capillary: 186 mg/dL — ABNORMAL HIGH (ref 70–99)

## 2020-05-21 LAB — CBC WITH DIFFERENTIAL/PLATELET
Abs Immature Granulocytes: 0.16 10*3/uL — ABNORMAL HIGH (ref 0.00–0.07)
Basophils Absolute: 0 10*3/uL (ref 0.0–0.1)
Basophils Relative: 0 %
Eosinophils Absolute: 0 10*3/uL (ref 0.0–0.5)
Eosinophils Relative: 0 %
HCT: 45.1 % (ref 39.0–52.0)
Hemoglobin: 14.2 g/dL (ref 13.0–17.0)
Immature Granulocytes: 2 %
Lymphocytes Relative: 6 %
Lymphs Abs: 0.6 10*3/uL — ABNORMAL LOW (ref 0.7–4.0)
MCH: 28.9 pg (ref 26.0–34.0)
MCHC: 31.5 g/dL (ref 30.0–36.0)
MCV: 91.9 fL (ref 80.0–100.0)
Monocytes Absolute: 0.5 10*3/uL (ref 0.1–1.0)
Monocytes Relative: 5 %
Neutro Abs: 8.3 10*3/uL — ABNORMAL HIGH (ref 1.7–7.7)
Neutrophils Relative %: 87 %
Platelets: 213 10*3/uL (ref 150–400)
RBC: 4.91 MIL/uL (ref 4.22–5.81)
RDW: 14.6 % (ref 11.5–15.5)
WBC: 9.6 10*3/uL (ref 4.0–10.5)
nRBC: 0.4 % — ABNORMAL HIGH (ref 0.0–0.2)

## 2020-05-21 LAB — COMPREHENSIVE METABOLIC PANEL
ALT: 82 U/L — ABNORMAL HIGH (ref 0–44)
AST: 115 U/L — ABNORMAL HIGH (ref 15–41)
Albumin: 2.8 g/dL — ABNORMAL LOW (ref 3.5–5.0)
Alkaline Phosphatase: 85 U/L (ref 38–126)
Anion gap: 9 (ref 5–15)
BUN: 28 mg/dL — ABNORMAL HIGH (ref 8–23)
CO2: 22 mmol/L (ref 22–32)
Calcium: 8.2 mg/dL — ABNORMAL LOW (ref 8.9–10.3)
Chloride: 112 mmol/L — ABNORMAL HIGH (ref 98–111)
Creatinine, Ser: 0.66 mg/dL (ref 0.61–1.24)
GFR calc Af Amer: >60 mL/min (ref >60)
GFR calc non Af Amer: >60 mL/min (ref >60)
Glucose, Bld: 148 mg/dL — ABNORMAL HIGH (ref 70–99)
Potassium: 4.6 mmol/L (ref 3.5–5.1)
Sodium: 143 mmol/L (ref 135–145)
Total Bilirubin: 0.9 mg/dL (ref 0.3–1.2)
Total Protein: 6.8 g/dL (ref 6.5–8.1)

## 2020-05-21 LAB — MAGNESIUM: Magnesium: 2.5 mg/dL — ABNORMAL HIGH (ref 1.7–2.4)

## 2020-05-21 LAB — FERRITIN: Ferritin: 535 ng/mL — ABNORMAL HIGH (ref 24–336)

## 2020-05-21 LAB — C-REACTIVE PROTEIN: CRP: 5.3 mg/dL — ABNORMAL HIGH (ref <1.0)

## 2020-05-21 LAB — D-DIMER, QUANTITATIVE: D-Dimer, Quant: 1 ug/mL-FEU — ABNORMAL HIGH (ref 0.00–0.50)

## 2020-05-21 NOTE — Progress Notes (Signed)
PROGRESS NOTE    [name] Center Sr.  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 Diagnosed with COVID-19 on 9/29.  Went to infusion center for Regeneron infusion, however he was found to be 75% on room air so transferred to ER. Patient had received his first dose of COVID-19 vaccine, and he was due for next dose.  Assessment & Plan:   Principal Problem:   Acute hypoxemic respiratory failure due to COVID-19 [facility]) Active Problems:   CAD s/p CABG 2005   Hyperlipidemia   Hypotension  Acute hypoxemic respiratory failure due to COVID-19 virus infection in a patient with head and neck deformity: Continue to monitor due to significant symptoms, still on significant oxygen. chest physiotherapy, incentive spirometry, deep breathing exercises, sputum induction, mucolytic's and bronchodilators. Patient does have surgical deformity of the nose and mouth along with the throat, difficult to participate on physical therapy and chest physio, however will continue as much as he can do. Supplemental oxygen to keep saturations more than 85 %. Covid directed therapy with , steroids, on high-dose Solu-Medrol. remdesivir, day 4/5 antibiotics, not indicated at this time. Due to severity of symptoms, patient will need daily inflammatory markers, chest x-rays, liver function test to monitor and direct COVID-19 therapies. Mobilize today.  COVID-19 Labs  Recent Labs    05/18/20 1355 05/18/20 1355 05/19/20 0421 05/20/20 0331 05/21/20 0518  DDIMER 1.46*   < > 0.94* 0.89* 1.00*  FERRITIN 573*   < > 454* 509* 535*  LDH 347*  --    --   --   --   CRP 19.6*   < > 16.0* 10.1* 5.3*   < > = values in this interval not displayed.    No results found for: SARSCOV2NAA, reported positive test on 9/29 outside.  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  Started on PEG tube feeding.  Will have nutrition consult.  Coronary artery disease status post CABG: Continue on aspirin and statin from home.   DVT prophylaxis: enoxaparin (LOVENOX) injection 40 mg Start: 05/18/20 1715   Code Status: Full code Family Communication: Patient's son on the phone, called and updated 10/4. Disposition Plan: Status is: Inpatient  Remains inpatient appropriate because:Inpatient level of care appropriate due to severity of illness   Dispo: The patient is from: Home              Anticipated d/c is to: Home              Anticipated d/c date is: 2 to 3 days.              Patient currently is not medically stable to d/c.         Consultants:   None  Procedures:   None  Antimicrobials:   Remdesivir, 10/2----   Subjective: Seen and examined.  He is on 10 L of oxygen but looks comfortable and feels comfortable. He was not quite happy about his tube feeding schedule because he wants exactly what he does at home.  Objective: Vitals:   05/20/20 1945 05/20/20 2015 05/21/20 0334 05/21/20 1338  BP:  139/80  131/76 (!) 148/83  Pulse: 68  62 72  Resp: 20  (!) 21 19  Temp: 98.5 F (36.9 C)  98 F (36.7 C) 97.7 F (36.5 C)  TempSrc:    Oral  SpO2: (!) 89% 91% 91% 95%  Weight:      Height:        Intake/Output Summary (Last 24 hours) at 05/21/2020 1348 Last data filed at 05/21/2020 0500 Gross per 24 hour  Intake 1274 ml  Output 500 ml  Net 774 ml   Filed Weights   05/20/20 0812  Weight: 72.1 kg    Examination:  General exam: Appears calm and comfortable on 10 L oxygen.  He has some difficulty phonation due to postsurgical deformity.   Patient  has postsurgical deformity on his lips and nose, however has clear airway. Respiratory system: Clear to auscultation. Respiratory effort normal.  Mostly conducted airway sounds. Cardiovascular system: S1 & S2 heard, RRR. No JVD, murmurs, rubs, gallops or clicks. No pedal edema. Gastrointestinal system: Abdomen is nondistended, soft and nontender. No organomegaly or masses felt. Normal bowel sounds heard. PEG tube in place clean and intact. Central nervous system: Alert and oriented. No focal neurological deficits. Extremities: Symmetric 5 x 5 power. Skin: No rashes, lesions or ulcers Psychiatry: Judgement and insight appear normal. Mood & affect appropriate.     Data Reviewed: I have personally reviewed following labs and imaging studies  CBC: Recent Labs  Lab 05/18/20 1355 05/19/20 0421 05/20/20 0331 05/21/20 0518  WBC 6.7 3.7* 8.5 9.6  NEUTROABS 5.6 3.0 7.2 8.3*  HGB 12.3* 10.4* 12.7* 14.2  HCT 37.9* 32.4* 39.3 45.1  MCV 89.4 90.0 88.9 91.9  PLT 173 148* PLATELET CLUMPS NOTED ON SMEAR, UNABLE TO ESTIMATE 213   Basic Metabolic Panel: Recent Labs  Lab 05/18/20 1355 05/19/20 0421 05/20/20 0331 05/21/20 0518  NA 144 145 144 143  K 3.9 3.4* 3.9 4.6  CL 103 113* 115* 112*  CO2 27 23 21* 22  GLUCOSE 95 119* 143* 148*  BUN 42* 38* 30* 28*  CREATININE 0.79 0.63 0.58* 0.66  CALCIUM 9.1 7.8* 7.9* 8.2*  MG  --  2.0 2.4 2.5*   GFR: Estimated Creatinine Clearance: 85.1 mL/min (by C-G formula based on SCr of 0.66 mg/dL). Liver Function Tests: Recent Labs  Lab 05/18/20 1355 05/19/20 0421 05/20/20 0331 05/21/20 0518  AST 102* 65* 71* 115*  ALT 63* 46* 51* 82*  ALKPHOS 81 59 66 85  BILITOT 1.0 0.9 0.5 0.9  PROT 7.8 6.1* 6.4* 6.8  ALBUMIN 3.4* 2.5* 2.7* 2.8*   No results for input(s): LIPASE, AMYLASE in the last 168 hours. No results for input(s): AMMONIA in the last 168 hours. Coagulation Profile: No results for input(s): INR, PROTIME in the last 168 hours. Cardiac  Enzymes: No results for input(s): CKTOTAL, CKMB, CKMBINDEX, TROPONINI in the last 168 hours. BNP (last 3 results) No results for input(s): PROBNP in the last 8760 hours. HbA1C: No results for input(s): HGBA1C in the last 72 hours. CBG: Recent Labs  Lab 05/20/20 1943 05/20/20 2306 05/21/20 0332 05/21/20 0727 05/21/20 1117  GLUCAP 105* 113* 150* 186* 151*   Lipid Profile: Recent Labs    05/18/20 1355  TRIG 44   Thyroid Function Tests: No results for input(s): TSH, T4TOTAL, FREET4, T3FREE, THYROIDAB in the last 72 hours. Anemia Panel: Recent Labs    05/20/20 0331 05/21/20 0518  FERRITIN 509* 535*   Sepsis Labs: Recent Labs  Lab 05/18/20 1355  PROCALCITON <0.10  LATICACIDVEN 1.9    Recent Results (from the past 240 hour(s))  Blood Culture (routine x 2)     Status: None (Preliminary result)   Collection Time: 05/18/20  1:55 PM   Specimen: BLOOD LEFT WRIST  Result Value Ref Range Status   Specimen Description   Final    BLOOD LEFT WRIST Performed at [facility], [address], [place], [place] [address]    Special Requests   Final    BOTTLES DRAWN AEROBIC AND ANAEROBIC Blood Culture adequate volume Performed at [facility], [address], [place], [place] [address]    Culture   Final    NO GROWTH 3 DAYS Performed at [facility], [address] [place], [place] [address]    Report Status PENDING  Incomplete  Blood Culture (routine x 2)     Status: None (Preliminary result)   Collection Time: 05/18/20  2:00 PM   Specimen: BLOOD LEFT FOREARM  Result Value Ref Range Status   Specimen Description   Final    BLOOD LEFT FOREARM Performed at [facility], [address], [place], [place] [address]    Special Requests   Final    BOTTLES DRAWN AEROBIC AND ANAEROBIC Blood Culture adequate volume Performed at [facility], [address], [place], [place] [address]    Culture   Final    NO GROWTH 3  DAYS Performed at [facility], [address], [place], [place] [address]    Report Status PENDING  Incomplete         Radiology Studies: No results found.      Scheduled Meds: . aspirin  81 mg Per Tube Daily  . enoxaparin (LOVENOX) injection  40 mg Subcutaneous Q24H  . feeding supplement (JEVITY 1.5 CAL/FIBER)  800 mL Per Tube Q24H  . feeding supplement (NEPRO CARB STEADY)  237 mL Per Tube BID BM  . methylPREDNISolone (SOLU-MEDROL) injection  60 mg Intravenous Q12H  . pantoprazole sodium  40 mg Per Tube BID  . simvastatin  20 mg Oral q1800   Continuous Infusions: . remdesivir 100 mg in NS 100 mL 100 mg (05/21/20 0946)     LOS: 3 days    Time spent: 35 minutes    Dorcas Carrow, MD [facility] Pager [phone]

## 2020-05-21 NOTE — Care Management Important Message (Signed)
Important Message  Patient Details IM Letter given to the Patient Name: [name] PUMPHREY Sr. MRN: [id number] Date of Birth: July 22, 1946   Medicare Important Message Given:  Yes     Kerin Salen 05/21/2020, 10:26 AM

## 2020-05-21 NOTE — Progress Notes (Signed)
Patient given 8oz/222mL of Jevity and tolerated well. Will continue to monitor the patient.

## 2020-05-21 NOTE — Progress Notes (Signed)
Nutrition Note  Noted that pt refused continuous feeds last night.  Spoke with patient to see if he had confusion given our conversation yesterday and what the plan would be regarding his tube feeding regimen here at the hospital.  Pt states he had never heard of Jevity 1.5. RD explained the kcal content and why the formula was being provided in a ready-to-hang container vs a bag. Also re-explained why this was being used instead of his usual TwoCal HN.  Pt states his tube feeding also was not started until after midnight which it was scheduled to be started at 9 pm. RD could not speak as to why this was but tried to answer all of pt's questions and reviewed the TF regimen that was ordered. Pt again agreed to what was discussed.  Pt did receive his morning bolus of Nepro and states this has gone well so far.   TF regimen as follows: -237 ml Nepro bolus between 1000-1100 -237 ml Nepro bolus 1800 -Continuous feeds of Jevity 1.5 @ 80 ml/hr x 10 hours (2100-0700)   If nutrition issues arise, please consult RD.   Clayton Bibles, MS, RD, LDN Inpatient Clinical Dietitian Contact information available via Amion

## 2020-05-21 NOTE — Progress Notes (Addendum)
Pt with 15 beats vtach. No symptoms. Labs reviewed. Oncall provider text paged.   Pt refused 2100-0700 continuous feed. Stated he felt full. Educated about the amount that will be given every hour. Pt continued to refuse. Will place RD re-consult.

## 2020-05-22 LAB — CBC WITH DIFFERENTIAL/PLATELET
Abs Immature Granulocytes: 0.21 10*3/uL — ABNORMAL HIGH (ref 0.00–0.07)
Basophils Absolute: 0 10*3/uL (ref 0.0–0.1)
Basophils Relative: 1 %
Eosinophils Absolute: 0 10*3/uL (ref 0.0–0.5)
Eosinophils Relative: 0 %
HCT: 42.8 % (ref 39.0–52.0)
Hemoglobin: 13.7 g/dL (ref 13.0–17.0)
Immature Granulocytes: 3 %
Lymphocytes Relative: 7 %
Lymphs Abs: 0.5 10*3/uL — ABNORMAL LOW (ref 0.7–4.0)
MCH: 28.7 pg (ref 26.0–34.0)
MCHC: 32 g/dL (ref 30.0–36.0)
MCV: 89.7 fL (ref 80.0–100.0)
Monocytes Absolute: 0.4 10*3/uL (ref 0.1–1.0)
Monocytes Relative: 6 %
Neutro Abs: 6.7 10*3/uL (ref 1.7–7.7)
Neutrophils Relative %: 83 %
Platelets: UNDETERMINED 10*3/uL (ref 150–400)
RBC: 4.77 MIL/uL (ref 4.22–5.81)
RDW: 14.1 % (ref 11.5–15.5)
WBC: 7.9 10*3/uL (ref 4.0–10.5)
nRBC: 0.5 % — ABNORMAL HIGH (ref 0.0–0.2)

## 2020-05-22 LAB — COMPREHENSIVE METABOLIC PANEL
ALT: 94 U/L — ABNORMAL HIGH (ref 0–44)
AST: 103 U/L — ABNORMAL HIGH (ref 15–41)
Albumin: 2.7 g/dL — ABNORMAL LOW (ref 3.5–5.0)
Alkaline Phosphatase: 80 U/L (ref 38–126)
Anion gap: 6 (ref 5–15)
BUN: 27 mg/dL — ABNORMAL HIGH (ref 8–23)
CO2: 21 mmol/L — ABNORMAL LOW (ref 22–32)
Calcium: 8.1 mg/dL — ABNORMAL LOW (ref 8.9–10.3)
Chloride: 111 mmol/L (ref 98–111)
Creatinine, Ser: 0.55 mg/dL — ABNORMAL LOW (ref 0.61–1.24)
GFR calc non Af Amer: >60 mL/min (ref >60)
Glucose, Bld: 122 mg/dL — ABNORMAL HIGH (ref 70–99)
Potassium: 3.8 mmol/L (ref 3.5–5.1)
Sodium: 138 mmol/L (ref 135–145)
Total Bilirubin: 0.9 mg/dL (ref 0.3–1.2)
Total Protein: 6.7 g/dL (ref 6.5–8.1)

## 2020-05-22 LAB — GLUCOSE, CAPILLARY
Glucose-Capillary: 118 mg/dL — ABNORMAL HIGH (ref 70–99)
Glucose-Capillary: 110 mg/dL — ABNORMAL HIGH (ref 70–99)
Glucose-Capillary: 207 mg/dL — ABNORMAL HIGH (ref 70–99)
Glucose-Capillary: 162 mg/dL — ABNORMAL HIGH (ref 70–99)
Glucose-Capillary: 167 mg/dL — ABNORMAL HIGH (ref 70–99)

## 2020-05-22 LAB — D-DIMER, QUANTITATIVE: D-Dimer, Quant: 0.83 ug/mL-FEU — ABNORMAL HIGH (ref 0.00–0.50)

## 2020-05-22 LAB — C-REACTIVE PROTEIN: CRP: 2.7 mg/dL — ABNORMAL HIGH (ref <1.0)

## 2020-05-22 LAB — FERRITIN: Ferritin: 434 ng/mL — ABNORMAL HIGH (ref 24–336)

## 2020-05-22 LAB — MAGNESIUM: Magnesium: 2.6 mg/dL — ABNORMAL HIGH (ref 1.7–2.4)

## 2020-05-22 MED ORDER — BARICITINIB 2 MG PO TABS
4.0000 mg | ORAL_TABLET | Freq: Every day | ORAL | Status: DC
  Administered 2020-05-22 – 2020-05-23 (×2): 4 mg via ORAL
  Filled 2020-05-22 (×3): qty 2

## 2020-05-22 MED ORDER — JEVITY 1.5 CAL/FIBER PO LIQD
237.0000 mL | Freq: Three times a day (TID) | ORAL | Status: DC
  Administered 2020-05-22 – 2020-05-25 (×9): 237 mL
  Filled 2020-05-22 (×12): qty 237

## 2020-05-22 NOTE — Progress Notes (Signed)
PROGRESS NOTE    [name].  WUJ:[id number] DOB: Jan 30, 1946 DOA: 05/18/2020 PCP: Shade Flood, MD   Chief Complaint  Patient presents with  . 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 Diagnosed with COVID-19 on 9/29.  Went to infusion center for Regeneron infusion, however he was found to be 75% on room air so transferred to ER. Patient had received his first dose of COVID-19 vaccine, and he was due for next dose.  Assessment & Plan:   Principal Problem:   Acute hypoxemic respiratory failure due to COVID-19 [facility]) Active Problems:   CAD s/p CABG 2005   Hyperlipidemia   Hypotension  Acute hypoxemic respiratory failure due to COVID-19 virus infection in [name] patient with head and neck deformity: Partially vaccinated CXR with chronic interstitial changes L >R on 10/2 Repeat CXR 10/7 Currently on 10 L HFNC  Patient does have surgical deformity of the nose and mouth along with the throat, difficult to participate on physical therapy and chest physio, however will continue as much as he can do. Supplemental oxygen to keep saturations more than 85 %. Continue steroids (10/2 - present).  Remdesivir (10/2-10/6).  Given significant o2 requirement, discussed risks/benefits of baricitinib including risk of infection, vte, malignancy.  Denies hx hepatitis or TB.  He's agreeable to baricitinib.  Strict I/O, daily weights Prone as able, OOB, IS, flutter, therapy  COVID-19 Labs  Recent Labs    05/20/20 0331 05/21/20 0518 05/22/20 0422  DDIMER 0.89* 1.00* 0.83*  FERRITIN 509* 535* 434*  CRP 10.1* 5.3* 2.7*    No results found for: SARSCOV2NAA  Dysphagia  secondary to surgical deformity: Has [name] functioning PEG tube.  Started on PEG tube feeding.  Will have nutrition consult.  Coronary artery disease status post CABG: Continue on aspirin and statin from home.  DVT prophylaxis: lovenox Code Status: full code Family Communication: none at bedside Disposition:   Status is: Inpatient  Remains inpatient appropriate because:Inpatient level of care appropriate due to severity of illness   Dispo: The patient is from: Home              Anticipated d/c is to: pending              Anticipated d/c date is: > 3 days              Patient currently is not medically stable to d/c. Consultants:   noen  Procedures:  none  Antimicrobials:  Anti-infectives (From admission, onward)   Start     Dose/Rate Route Frequency Ordered Stop   05/19/20 1000  remdesivir 100 mg in sodium chloride 0.9 % 100 mL IVPB       "Followed by" Linked Group Details   100 mg 200 mL/hr over 30 Minutes Intravenous Daily 05/18/20 1409 05/22/20 1150   05/19/20 1000  remdesivir 100 mg in sodium chloride 0.9 % 100 mL IVPB  Status:  Discontinued       "Followed by" Linked Group Details   100 mg 200 mL/hr over 30 Minutes Intravenous Daily 05/18/20 1709 05/18/20 1720   05/18/20 1715  remdesivir 200 mg in sodium chloride 0.9% 250 mL IVPB  Status:  Discontinued       "Followed  by" Linked Group Details   200 mg 580 mL/hr over 30 Minutes Intravenous Once 05/18/20 1709 05/18/20 1720   05/18/20 1600  remdesivir 200 mg in sodium chloride 0.9% 250 mL IVPB       "Followed by" Linked Group Details   200 mg 580 mL/hr over 30 Minutes Intravenous Once 05/18/20 1409 05/18/20 1737      Subjective: Feels ok overall No new complaints today  Objective: Vitals:   05/21/20 1338 05/21/20 2011 05/22/20 0332 05/22/20 1341  BP: (!) 148/83 (!) 149/82 (!) 148/77 136/83  Pulse: 72 71 61 68  Resp: 19 (!) 22 (!) 22 20  Temp: 97.7 F (36.5 C) 98.7 F (37.1 C) 98 F (36.7 C) 98.1 F (36.7  C)  TempSrc: Oral   Oral  SpO2: 95% (!) 87% (!) 89% 94%  Weight:      Height:        Intake/Output Summary (Last 24 hours) at 05/22/2020 1939 Last data filed at 05/22/2020 1828 Gross per 24 hour  Intake 60 ml  Output 1200 ml  Net -1140 ml   Filed Weights   05/20/20 0812  Weight: 72.1 kg    Examination:  General exam: Appears calm and comfortable  Head/neck deformity noted Respiratory system: Clear to auscultation. Respiratory effort normal. Cardiovascular system: S1 & S2 heard, RRR.  Gastrointestinal system: Abdomen is nondistended, soft and nontender.  Peg in place.  Central nervous system: Alert and oriented. No focal neurological deficits. Extremities: no LEE Skin: No rashes, lesions or ulcers Psychiatry: Judgement and insight appear normal. Mood & affect appropriate.     Data Reviewed: I have personally reviewed following labs and imaging studies  CBC: Recent Labs  Lab 05/18/20 1355 05/19/20 0421 05/20/20 0331 05/21/20 0518 05/22/20 0422  WBC 6.7 3.7* 8.5 9.6 7.9  NEUTROABS 5.6 3.0 7.2 8.3* 6.7  HGB 12.3* 10.4* 12.7* 14.2 13.7  HCT 37.9* 32.4* 39.3 45.1 42.8  MCV 89.4 90.0 88.9 91.9 89.7  PLT 173 148* PLATELET CLUMPS NOTED ON SMEAR, UNABLE TO ESTIMATE 213 PLATELET CLUMPS NOTED ON SMEAR, UNABLE TO ESTIMATE    Basic Metabolic Panel: Recent Labs  Lab 05/18/20 1355 05/19/20 0421 05/20/20 0331 05/21/20 0518 05/22/20 0422  NA 144 145 144 143 138  K 3.9 3.4* 3.9 4.6 3.8  CL 103 113* 115* 112* 111  CO2 27 23 21* 22 21*  GLUCOSE 95 119* 143* 148* 122*  BUN 42* 38* 30* 28* 27*  CREATININE 0.79 0.63 0.58* 0.66 0.55*  CALCIUM 9.1 7.8* 7.9* 8.2* 8.1*  MG  --  2.0 2.4 2.5* 2.6*    GFR: Estimated Creatinine Clearance: 85.1 mL/min ([name]) (by C-G formula based on SCr of 0.55 mg/dL (L)).  Liver Function Tests: Recent Labs  Lab 05/18/20 1355 05/19/20 0421 05/20/20 0331 05/21/20 0518 05/22/20 0422  AST 102* 65* 71* 115* 103*  ALT 63* 46* 51* 82* 94*   ALKPHOS 81 59 66 85 80  BILITOT 1.0 0.9 0.5 0.9 0.9  PROT 7.8 6.1* 6.4* 6.8 6.7  ALBUMIN 3.4* 2.5* 2.7* 2.8* 2.7*    CBG: Recent Labs  Lab 05/21/20 2003 05/21/20 2336 05/22/20 0329 05/22/20 0743 05/22/20 1642  GLUCAP 146* 131* 118* 110* 207*     Recent Results (from the past 240 hour(s))  Blood Culture (routine x 2)     Status: None (Preliminary result)   Collection Time: 05/18/20  1:55 PM   Specimen: BLOOD LEFT WRIST  Result Value Ref Range Status   Specimen Description  Final    BLOOD LEFT WRIST Performed at [facility], [address] [place], [place] [address]    Special Requests   Final    BOTTLES DRAWN AEROBIC AND ANAEROBIC Blood Culture adequate volume Performed at [facility], [address], [place], [place] [address]    Culture   Final    NO GROWTH 4 DAYS Performed at [facility], [address] [place], [place] [address]    Report Status PENDING  Incomplete  Blood Culture (routine x 2)     Status: None (Preliminary result)   Collection Time: 05/18/20  2:00 PM   Specimen: BLOOD LEFT FOREARM  Result Value Ref Range Status   Specimen Description   Final    BLOOD LEFT FOREARM Performed at [facility], [address], [place], [place] [address]    Special Requests   Final    BOTTLES DRAWN AEROBIC AND ANAEROBIC Blood Culture adequate volume Performed at [facility], [address] [place], [place] [address]    Culture   Final    NO GROWTH 4 DAYS Performed at [facility], [address], [place], [place] [address]    Report Status PENDING  Incomplete         Radiology Studies: No results found.      Scheduled Meds: . aspirin  81 mg Per Tube Daily  . baricitinib  4 mg Oral Daily  . enoxaparin (LOVENOX) injection  40 mg Subcutaneous Q24H  . feeding supplement (JEVITY 1.5 CAL/FIBER)  237 mL Per Tube TID WC  . feeding supplement (NEPRO CARB STEADY)  237 mL Per Tube BID BM   . methylPREDNISolone (SOLU-MEDROL) injection  60 mg Intravenous Q12H  . pantoprazole sodium  40 mg Per Tube BID  . simvastatin  20 mg Oral q1800   Continuous Infusions:   LOS: 4 days    Time spent: over 30 min    Lacretia Nicks, MD [facility]   To contact the attending provider between 7A-7P or the covering provider during after hours 7P-7A, please log into the web site [email or web address] and access using universal [place] password for that web site. If you do not have the password, please call the hospital operator.  05/22/2020, 7:39 PM

## 2020-05-22 NOTE — Progress Notes (Signed)
Nutrition Follow-up  INTERVENTION:   -Transition to bolus feeds as pt continues to refuse continuous feeds at night despite education from myself and RNs.  -237 ml Nepro BID via PEG -237 ml Jevity 1.5 TID via PEG -Flush with 30 ml free water before and after each bolus -Provides 1905 kcals, 83g protein and 1184 ml H2O. -Recommend additional free water of 100 ml every 4 hours (600 ml)  -If feelings of fullness continue may need to consider prokinetic agent (pt reported tolerating continuous feeds fine at home)  NUTRITION DIAGNOSIS:   Increased nutrient needs related to acute illness (COVID-19 infection) as evidenced by estimated needs.  Ongoing.  GOAL:   Patient will meet greater than or equal to 90% of their needs  Progressing.  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  Went to infusion center for Regeneron infusion, however he was found to be 75% on room air so transferred to ER.Patient had received his first dose of COVID-19 vaccine, and he was due for next dose.  Again pt is refusing continuous feeds at night d/t it not being his typical regimen. Pt not able to understand changes made and staff have attempted to educate pt about volumes being provided. Pt reporting feelings of fullness, however has tolerated continuous feeds at home. May need prokinetic agent if this is a continued issue.  Pt does accept bolus feeds so will change to complete bolus regimen and once home pt can resume combination of bolus and  continuous feeds.  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   Attempt was made to speak with pt's son on the phone but no answer.  Admission weight: 159 lbs. No new weights for admission.  Medications reviewed. Labs reviewed: CBGs: 110-118 Elevated Mg  Diet Order:   Diet Order            Diet NPO time specified Except for: Sips with Meds  Diet effective now                 EDUCATION NEEDS:   Education needs have been addressed  Skin:  Skin Assessment: Reviewed RN Assessment  Last BM:  10/5  Height:   Ht Readings from Last 1 Encounters:  05/20/20 5\' 10"  (1.778 m)    Weight:   Wt Readings from Last 1 Encounters:  05/20/20 72.1 kg   BMI:  Body mass index is 22.81 kg/m.  Estimated Nutritional Needs:   Kcal:  2000-2200  Protein:  100-115g  Fluid:  2L/day  Clayton Bibles, MS, RD, LDN Inpatient Clinical Dietitian Contact information available via Amion

## 2020-05-23 ENCOUNTER — Inpatient Hospital Stay (HOSPITAL_COMMUNITY): Payer: Medicare HMO

## 2020-05-23 LAB — CBC WITH DIFFERENTIAL/PLATELET
Abs Immature Granulocytes: 0.26 10*3/uL — ABNORMAL HIGH (ref 0.00–0.07)
Basophils Absolute: 0 10*3/uL (ref 0.0–0.1)
Basophils Relative: 1 %
Eosinophils Absolute: 0 10*3/uL (ref 0.0–0.5)
Eosinophils Relative: 0 %
HCT: 41.3 % (ref 39.0–52.0)
Hemoglobin: 13.4 g/dL (ref 13.0–17.0)
Immature Granulocytes: 4 %
Lymphocytes Relative: 10 %
Lymphs Abs: 0.6 10*3/uL — ABNORMAL LOW (ref 0.7–4.0)
MCH: 28.8 pg (ref 26.0–34.0)
MCHC: 32.4 g/dL (ref 30.0–36.0)
MCV: 88.6 fL (ref 80.0–100.0)
Monocytes Absolute: 0.4 10*3/uL (ref 0.1–1.0)
Monocytes Relative: 6 %
Neutro Abs: 4.9 10*3/uL (ref 1.7–7.7)
Neutrophils Relative %: 79 %
Platelets: 160 10*3/uL (ref 150–400)
RBC: 4.66 MIL/uL (ref 4.22–5.81)
RDW: 14.1 % (ref 11.5–15.5)
WBC: 6.2 10*3/uL (ref 4.0–10.5)
nRBC: 0.5 % — ABNORMAL HIGH (ref 0.0–0.2)

## 2020-05-23 LAB — CULTURE, BLOOD (ROUTINE X 2)
Culture: NO GROWTH
Culture: NO GROWTH
Special Requests: ADEQUATE
Special Requests: ADEQUATE

## 2020-05-23 LAB — COMPREHENSIVE METABOLIC PANEL
ALT: 78 U/L — ABNORMAL HIGH (ref 0–44)
AST: 63 U/L — ABNORMAL HIGH (ref 15–41)
Albumin: 2.5 g/dL — ABNORMAL LOW (ref 3.5–5.0)
Alkaline Phosphatase: 86 U/L (ref 38–126)
Anion gap: 7 (ref 5–15)
BUN: 29 mg/dL — ABNORMAL HIGH (ref 8–23)
CO2: 21 mmol/L — ABNORMAL LOW (ref 22–32)
Calcium: 7.8 mg/dL — ABNORMAL LOW (ref 8.9–10.3)
Chloride: 109 mmol/L (ref 98–111)
Creatinine, Ser: 0.48 mg/dL — ABNORMAL LOW (ref 0.61–1.24)
GFR calc non Af Amer: >60 mL/min (ref >60)
Glucose, Bld: 126 mg/dL — ABNORMAL HIGH (ref 70–99)
Potassium: 4.1 mmol/L (ref 3.5–5.1)
Sodium: 137 mmol/L (ref 135–145)
Total Bilirubin: 0.8 mg/dL (ref 0.3–1.2)
Total Protein: 6 g/dL — ABNORMAL LOW (ref 6.5–8.1)

## 2020-05-23 LAB — D-DIMER, QUANTITATIVE: D-Dimer, Quant: 0.55 ug/mL-FEU — ABNORMAL HIGH (ref 0.00–0.50)

## 2020-05-23 LAB — GLUCOSE, CAPILLARY
Glucose-Capillary: 127 mg/dL — ABNORMAL HIGH (ref 70–99)
Glucose-Capillary: 164 mg/dL — ABNORMAL HIGH (ref 70–99)
Glucose-Capillary: 173 mg/dL — ABNORMAL HIGH (ref 70–99)
Glucose-Capillary: 181 mg/dL — ABNORMAL HIGH (ref 70–99)
Glucose-Capillary: 183 mg/dL — ABNORMAL HIGH (ref 70–99)
Glucose-Capillary: 195 mg/dL — ABNORMAL HIGH (ref 70–99)
Glucose-Capillary: 253 mg/dL — ABNORMAL HIGH (ref 70–99)

## 2020-05-23 LAB — MAGNESIUM: Magnesium: 2.4 mg/dL (ref 1.7–2.4)

## 2020-05-23 LAB — FERRITIN: Ferritin: 327 ng/mL (ref 24–336)

## 2020-05-23 LAB — C-REACTIVE PROTEIN: CRP: 1.3 mg/dL — ABNORMAL HIGH (ref <1.0)

## 2020-05-23 MED ORDER — BARICITINIB 2 MG PO TABS
4.0000 mg | ORAL_TABLET | Freq: Every day | ORAL | Status: DC
  Administered 2020-05-24 – 2020-05-25 (×2): 4 mg
  Filled 2020-05-23 (×2): qty 2

## 2020-05-23 NOTE — Progress Notes (Signed)
PROGRESS NOTE    [name] Center Sr.  ZOX:[id number] DOB: Nov 30, 1945 DOA: 05/18/2020 PCP: Shade Flood, MD   Chief Complaint  Patient presents with  . 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 Diagnosed with COVID-19 on 9/29.  Went to infusion center for Regeneron infusion, however he was found to be 75% on room air so transferred to ER. Patient had received his first dose of COVID-19 vaccine, and he was due for next dose.  Assessment & Plan:   Principal Problem:   Acute hypoxemic respiratory failure due to COVID-19 [facility]) Active Problems:   CAD s/p CABG 2005   Hyperlipidemia   Hypotension  Acute hypoxemic respiratory failure due to COVID-19 virus infection in [name] patient with head and neck deformity: Partially vaccinated CXR with chronic interstitial changes L >R on 10/2 Repeat CXR 10/7 with bibasilar atelectasis/infiltrates and chronic interstitial lung disease Currently on 9 L HFNC - wean as tolerated Patient does have surgical deformity of the nose and mouth along with the throat, difficult to participate on physical therapy and chest physio, however will continue as much as he can do. Supplemental oxygen to keep saturations more than 85 %. Continue steroids (10/2 - present).  Remdesivir (10/2-10/6).  Baricitinib 10/6 - present. Strict I/O, daily weights Prone as able, OOB, IS, flutter, therapy  COVID-19 Labs  Recent Labs    05/21/20 0518 05/22/20 0422 05/23/20 0402  DDIMER 1.00* 0.83* 0.55*  FERRITIN 535* 434* 327  CRP 5.3* 2.7* 1.3*    No results found for: SARSCOV2NAA  Dysphagia secondary to surgical deformity: Has Randye Treichler functioning PEG tube.   Started on PEG tube feeding.  Will have nutrition consult (he's been transitioned to bolus food as he's refused continuous feeds at night).  Coronary artery disease status post CABG: Continue on aspirin and statin from home.  DVT prophylaxis: lovenox Code Status: full code Family Communication: none at bedside Disposition:   Status is: Inpatient  Remains inpatient appropriate because:Inpatient level of care appropriate due to severity of illness   Dispo: The patient is from: Home              Anticipated d/c is to: pending              Anticipated d/c date is: > 3 days              Patient currently is not medically stable to d/c. Consultants:   noen  Procedures:  none  Antimicrobials:  Anti-infectives (From admission, onward)   Start     Dose/Rate Route Frequency Ordered Stop   05/19/20 1000  remdesivir 100 mg in sodium chloride 0.9 % 100 mL IVPB       "Followed by" Linked Group Details   100 mg 200 mL/hr over 30 Minutes Intravenous Daily 05/18/20 1409 05/22/20 1150   05/19/20 1000  remdesivir 100 mg in sodium chloride 0.9 % 100 mL IVPB  Status:  Discontinued       "Followed by" Linked Group Details   100 mg 200 mL/hr over 30 Minutes Intravenous Daily 05/18/20 1709 05/18/20 1720   05/18/20 1715  remdesivir 200 mg in sodium chloride 0.9% 250 mL IVPB  Status:  Discontinued       "  Followed by" Linked Group Details   200 mg 580 mL/hr over 30 Minutes Intravenous Once 05/18/20 1709 05/18/20 1720   05/18/20 1600  remdesivir 200 mg in sodium chloride 0.9% 250 mL IVPB       "Followed by" Linked Group Details   200 mg 580 mL/hr over 30 Minutes Intravenous Once 05/18/20 1409 05/18/20 1737      Subjective:  No new  Complaints  Objective: Vitals:   05/22/20 2122 05/23/20 0450 05/23/20 0500 05/23/20 1317  BP: 140/75 (!) 150/77  (!) 142/79  Pulse: 61 (!) 50  60  Resp: 18 16  16   Temp: 97.7 F (36.5 C) (!) 97.5 F (36.4 C)  98.1 F (36.7 C)  TempSrc:    Oral  SpO2: 93%  94%  96%  Weight:   75.9 kg   Height:        Intake/Output Summary (Last 24 hours) at 05/23/2020 1459 Last data filed at 05/23/2020 1000 Gross per 24 hour  Intake 764 ml  Output 1300 ml  Net -536 ml   Filed Weights   05/20/20 0812 05/23/20 0500  Weight: 72.1 kg 75.9 kg    Examination:  General: No acute distress. Head/neck deformity Cardiovascular: Heart sounds show Kaliope Quinonez regular rate, and rhythm. Lungs: Clear to auscultation bilaterally  Abdomen: Soft, nontender, nondistended Neurological: Alert and oriented 3. Moves all extremities 4 . Cranial nerves II through XII grossly intact. Skin: Warm and dry. No rashes or lesions. Extremities: No clubbing or cyanosis. No edema. .  Data Reviewed: I have personally reviewed following labs and imaging studies  CBC: Recent Labs  Lab 05/19/20 0421 05/20/20 0331 05/21/20 0518 05/22/20 0422 05/23/20 0402  WBC 3.7* 8.5 9.6 7.9 6.2  NEUTROABS 3.0 7.2 8.3* 6.7 4.9  HGB 10.4* 12.7* 14.2 13.7 13.4  HCT 32.4* 39.3 45.1 42.8 41.3  MCV 90.0 88.9 91.9 89.7 88.6  PLT 148* PLATELET CLUMPS NOTED ON SMEAR, UNABLE TO ESTIMATE 213 PLATELET CLUMPS NOTED ON SMEAR, UNABLE TO ESTIMATE 160    Basic Metabolic Panel: Recent Labs  Lab 05/19/20 0421 05/20/20 0331 05/21/20 0518 05/22/20 0422 05/23/20 0402  NA 145 144 143 138 137  K 3.4* 3.9 4.6 3.8 4.1  CL 113* 115* 112* 111 109  CO2 23 21* 22 21* 21*  GLUCOSE 119* 143* 148* 122* 126*  BUN 38* 30* 28* 27* 29*  CREATININE 0.63 0.58* 0.66 0.55* 0.48*  CALCIUM 7.8* 7.9* 8.2* 8.1* 7.8*  MG 2.0 2.4 2.5* 2.6* 2.4    GFR: Estimated Creatinine Clearance: 86.2 mL/min (Jovanne Riggenbach) (by C-G formula based on SCr of 0.48 mg/dL (L)).  Liver Function Tests: Recent Labs  Lab 05/19/20 0421 05/20/20 0331 05/21/20 0518 05/22/20 0422 05/23/20 0402  AST 65* 71* 115* 103* 63*  ALT 46* 51* 82* 94* 78*  ALKPHOS 59 66 85 80 86  BILITOT 0.9 0.5 0.9 0.9 0.8  PROT 6.1* 6.4* 6.8 6.7 6.0*  ALBUMIN 2.5* 2.7* 2.8* 2.7*  2.5*    CBG: Recent Labs  Lab 05/22/20 2118 05/22/20 2359 05/23/20 0444 05/23/20 0754 05/23/20 1126  GLUCAP 167* 164* 127* 195* 173*     Recent Results (from the past 240 hour(s))  Blood Culture (routine x 2)     Status: None (Preliminary result)   Collection Time: 05/18/20  1:55 PM   Specimen: BLOOD LEFT WRIST  Result Value Ref Range Status   Specimen Description   Final    BLOOD LEFT WRIST Performed at [facility], [address] [place], [place] [address]    Special Requests   Final    BOTTLES DRAWN AEROBIC AND ANAEROBIC Blood Culture adequate volume Performed at [facility], [address], [place], [place] [address]    Culture   Final    NO GROWTH 4 DAYS Performed at [facility], [address], [place], [place] [address]    Report Status PENDING  Incomplete  Blood Culture (routine x 2)     Status: None (Preliminary result)   Collection Time: 05/18/20  2:00 PM   Specimen: BLOOD LEFT FOREARM  Result Value Ref Range Status   Specimen Description   Final    BLOOD LEFT FOREARM Performed at [facility], [address] [place], [place] [address]    Special Requests   Final    BOTTLES DRAWN AEROBIC AND ANAEROBIC Blood Culture adequate volume Performed at [facility], [address] [place], [place] [address]    Culture   Final    NO GROWTH 4 DAYS Performed at [facility], [address], [place], [place] [address]    Report Status PENDING  Incomplete         Radiology Studies: DG CHEST PORT 1 VIEW  Result Date: 05/23/2020 CLINICAL DATA:  Hypoxia.  COVID. EXAM: PORTABLE CHEST 1 VIEW COMPARISON:  05/18/2020.  06/13/2019.  01/29/2004. FINDINGS: Mediastinum and hilar structures normal. Prior CABG. Heart size normal. Bibasilar infiltrates. Underlying chronic interstitial lung disease. No pleural effusion or pneumothorax. IMPRESSION: 1. Bibasilar atelectasis/infiltrates. 2. Underlying chronic  interstitial lung disease. Electronically Signed   By: Maisie Fus  Register   On: 05/23/2020 06:37        Scheduled Meds: . aspirin  81 mg Per Tube Daily  . [START ON 05/24/2020] baricitinib  4 mg Per Tube Daily  . enoxaparin (LOVENOX) injection  40 mg Subcutaneous Q24H  . feeding supplement (JEVITY 1.5 CAL/FIBER)  237 mL Per Tube TID WC  . feeding supplement (NEPRO CARB STEADY)  237 mL Per Tube BID BM  . methylPREDNISolone (SOLU-MEDROL) injection  60 mg Intravenous Q12H  . pantoprazole sodium  40 mg Per Tube BID  . simvastatin  20 mg Oral q1800   Continuous Infusions:   LOS: 5 days    Time spent: over 30 min    Lacretia Nicks, MD [facility]   To contact the attending provider between 7A-7P or the covering provider during after hours 7P-7A, please log into the web site [email or web address] and access using universal [place] password for that web site. If you do not have the password, please call the hospital operator.  05/23/2020, 2:59 PM

## 2020-05-24 LAB — COMPREHENSIVE METABOLIC PANEL
ALT: 71 U/L — ABNORMAL HIGH (ref 0–44)
AST: 47 U/L — ABNORMAL HIGH (ref 15–41)
Albumin: 2.6 g/dL — ABNORMAL LOW (ref 3.5–5.0)
Alkaline Phosphatase: 95 U/L (ref 38–126)
Anion gap: 11 (ref 5–15)
BUN: 28 mg/dL — ABNORMAL HIGH (ref 8–23)
CO2: 21 mmol/L — ABNORMAL LOW (ref 22–32)
Calcium: 8.3 mg/dL — ABNORMAL LOW (ref 8.9–10.3)
Chloride: 104 mmol/L (ref 98–111)
Creatinine, Ser: 0.52 mg/dL — ABNORMAL LOW (ref 0.61–1.24)
GFR calc non Af Amer: >60 mL/min (ref >60)
Glucose, Bld: 164 mg/dL — ABNORMAL HIGH (ref 70–99)
Potassium: 4 mmol/L (ref 3.5–5.1)
Sodium: 136 mmol/L (ref 135–145)
Total Bilirubin: 0.9 mg/dL (ref 0.3–1.2)
Total Protein: 6 g/dL — ABNORMAL LOW (ref 6.5–8.1)

## 2020-05-24 LAB — GLUCOSE, CAPILLARY
Glucose-Capillary: 119 mg/dL — ABNORMAL HIGH (ref 70–99)
Glucose-Capillary: 119 mg/dL — ABNORMAL HIGH (ref 70–99)
Glucose-Capillary: 123 mg/dL — ABNORMAL HIGH (ref 70–99)
Glucose-Capillary: 153 mg/dL — ABNORMAL HIGH (ref 70–99)
Glucose-Capillary: 184 mg/dL — ABNORMAL HIGH (ref 70–99)
Glucose-Capillary: 186 mg/dL — ABNORMAL HIGH (ref 70–99)

## 2020-05-24 LAB — CBC WITH DIFFERENTIAL/PLATELET
Abs Immature Granulocytes: 0.39 10*3/uL — ABNORMAL HIGH (ref 0.00–0.07)
Basophils Absolute: 0 10*3/uL (ref 0.0–0.1)
Basophils Relative: 0 %
Eosinophils Absolute: 0 10*3/uL (ref 0.0–0.5)
Eosinophils Relative: 0 %
HCT: 43.1 % (ref 39.0–52.0)
Hemoglobin: 14.2 g/dL (ref 13.0–17.0)
Immature Granulocytes: 5 %
Lymphocytes Relative: 5 %
Lymphs Abs: 0.5 10*3/uL — ABNORMAL LOW (ref 0.7–4.0)
MCH: 28.6 pg (ref 26.0–34.0)
MCHC: 32.9 g/dL (ref 30.0–36.0)
MCV: 86.7 fL (ref 80.0–100.0)
Monocytes Absolute: 0.7 10*3/uL (ref 0.1–1.0)
Monocytes Relative: 8 %
Neutro Abs: 6.9 10*3/uL (ref 1.7–7.7)
Neutrophils Relative %: 82 %
Platelets: 211 10*3/uL (ref 150–400)
RBC: 4.97 MIL/uL (ref 4.22–5.81)
RDW: 14.1 % (ref 11.5–15.5)
WBC: 8.5 10*3/uL (ref 4.0–10.5)
nRBC: 0 % (ref 0.0–0.2)

## 2020-05-24 LAB — FERRITIN: Ferritin: 332 ng/mL (ref 24–336)

## 2020-05-24 LAB — MAGNESIUM: Magnesium: 2.3 mg/dL (ref 1.7–2.4)

## 2020-05-24 LAB — C-REACTIVE PROTEIN: CRP: 0.8 mg/dL (ref <1.0)

## 2020-05-24 LAB — PHOSPHORUS: Phosphorus: 2.6 mg/dL (ref 2.5–4.6)

## 2020-05-24 LAB — D-DIMER, QUANTITATIVE: D-Dimer, Quant: 0.76 ug/mL-FEU — ABNORMAL HIGH (ref 0.00–0.50)

## 2020-05-24 NOTE — Progress Notes (Signed)
PROGRESS NOTE    [name] Sr.  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 Diagnosed with COVID-19 on 9/29.  Went to [name] for Regeneron infusion, however he was found to be 75% on room air so transferred to ER. Patient had received his first dose of COVID-19 vaccine, and he was due for next dose.  Assessment & Plan:   Principal Problem:   Acute hypoxemic respiratory failure due to COVID-19 [name]) Active Problems:   CAD s/p CABG 2005   Hyperlipidemia   Hypotension  Acute hypoxemic respiratory failure due to COVID-19 virus infection in [name] patient with head and neck deformity: Partially vaccinated CXR with chronic interstitial changes L >R on 10/2 Repeat CXR 10/7 with bibasilar atelectasis/infiltrates and chronic interstitial lung disease Currently on 4 L [name], much improved today, continue to wean as tolerated  Patient does have surgical deformity of the nose and mouth along with the throat, difficult to participate on physical therapy and chest physio, however will continue as much as he can do. Supplemental oxygen to keep saturations more than 85 %. Continue steroids (10/2 - present).  Remdesivir (10/2-10/6).  Baricitinib 10/6 - present. 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  Started on PEG tube feeding.  Will have nutrition consult (he's been transitioned to bolus food as he's refused continuous feeds at night).  Coronary artery disease status post CABG: Continue on aspirin and statin from home.  DVT prophylaxis: lovenox Code Status: full code Family Communication: none at bedside Disposition:   Status is: Inpatient  Remains inpatient appropriate because:Inpatient level of care appropriate due to severity of illness   Dispo: The patient is from: Home              Anticipated d/c is to: pending              Anticipated d/c date is: > 3 days              Patient currently is not medically stable to d/c. Consultants:   noen  Procedures:  none  Antimicrobials:  Anti-infectives (From admission, onward)   Start     Dose/Rate Route Frequency Ordered Stop   05/19/20 1000  remdesivir 100 mg in sodium chloride 0.9 % 100 mL IVPB       "Followed by" Linked Group Details   100 mg 200 mL/hr over 30 Minutes Intravenous Daily 05/18/20 1409 05/22/20 1150   05/19/20 1000  remdesivir 100 mg in sodium chloride 0.9 % 100 mL IVPB  Status:  Discontinued       "Followed by" Linked Group Details   100 mg 200 mL/hr over 30 Minutes Intravenous Daily 05/18/20 1709 05/18/20 1720   05/18/20 1715  remdesivir 200 mg in sodium chloride 0.9% 250 mL IVPB  Status:  Discontinued       "  Followed by" Linked Group Details   200 mg 580 mL/hr over 30 Minutes Intravenous Once 05/18/20 1709 05/18/20 1720   05/18/20 1600  remdesivir 200 mg in sodium chloride 0.9% 250 mL IVPB       "Followed by" Linked Group Details   200 mg 580 mL/hr over 30 Minutes Intravenous Once 05/18/20 1409 05/18/20 1737      Subjective: No new complaints today Feeling better  Objective: Vitals:   05/23/20 1946 05/24/20 0348 05/24/20 0354 05/24/20 0900  BP: (!) 149/83 139/82    Pulse: 63 (!) 49    Resp: 18 18    Temp: 98.6 F (37 C) 98.1 F (36.7 C)     TempSrc: Oral Oral    SpO2: 93% 94%  92%  Weight:   74.1 kg   Height:        Intake/Output Summary (Last 24 hours) at 05/24/2020 1216 Last data filed at 05/24/2020 0515 Gross per 24 hour  Intake 1158 ml  Output 1000 ml  Net 158 ml   Filed Weights   05/20/20 0812 05/23/20 0500 05/24/20 0354  Weight: 72.1 kg 75.9 kg 74.1 kg    Examination:  General: No acute distress. Head/neck deformity noted Cardiovascular: Heart sounds show [name] regular rate, and rhythm Lungs: Clear to auscultation bilaterally Abdomen: Soft, nontender, nondistended. pegt tube. Neurological: Alert and oriented 3. Moves all extremities 4. Cranial nerves II through XII grossly intact. Skin: Warm and dry. No rashes or lesions. Extremities: No clubbing or cyanosis. No edema   Data Reviewed: I have personally reviewed following labs and imaging studies  CBC: Recent Labs  Lab 05/20/20 0331 05/21/20 0518 05/22/20 0422 05/23/20 0402 05/24/20 0903  WBC 8.5 9.6 7.9 6.2 8.5  NEUTROABS 7.2 8.3* 6.7 4.9 6.9  HGB 12.7* 14.2 13.7 13.4 14.2  HCT 39.3 45.1 42.8 41.3 43.1  MCV 88.9 91.9 89.7 88.6 86.7  PLT PLATELET CLUMPS NOTED ON SMEAR, UNABLE TO ESTIMATE 213 PLATELET CLUMPS NOTED ON SMEAR, UNABLE TO ESTIMATE 160 211    Basic Metabolic Panel: Recent Labs  Lab 05/20/20 0331 05/21/20 0518 05/22/20 0422 05/23/20 0402 05/24/20 0903  NA 144 143 138 137 136  K 3.9 4.6 3.8 4.1 4.0  CL 115* 112* 111 109 104  CO2 21* 22 21* 21* 21*  GLUCOSE 143* 148* 122* 126* 164*  BUN 30* 28* 27* 29* 28*  CREATININE 0.58* 0.66 0.55* 0.48* 0.52*  CALCIUM 7.9* 8.2* 8.1* 7.8* 8.3*  MG 2.4 2.5* 2.6* 2.4 2.3  PHOS  --   --   --   --  2.6    GFR: Estimated Creatinine Clearance: 86.2 mL/min ([name]) (by C-G formula based on SCr of 0.52 mg/dL (L)).  Liver Function Tests: Recent Labs  Lab 05/20/20 0331 05/21/20 0518 05/22/20 0422 05/23/20 0402 05/24/20 0903  AST 71* 115* 103* 63* 47*  ALT 51* 82* 94* 78* 71*  ALKPHOS 66 85  80 86 95  BILITOT 0.5 0.9 0.9 0.8 0.9  PROT 6.4* 6.8 6.7 6.0* 6.0*  ALBUMIN 2.7* 2.8* 2.7* 2.5* 2.6*    CBG: Recent Labs  Lab 05/23/20 1948 05/23/20 2338 05/24/20 0350 05/24/20 0743 05/24/20 1123  GLUCAP 181* 253* 123* 186* 119*     Recent Results (from the past 240 hour(s))  Blood Culture (routine x 2)     Status: None   Collection Time: 05/18/20  1:55 PM   Specimen: BLOOD LEFT WRIST  Result Value Ref Range Status   Specimen Description   Final  BLOOD LEFT WRIST Performed at [facility], [address], [place], [place] [address]    Special Requests   Final    BOTTLES DRAWN AEROBIC AND ANAEROBIC Blood Culture adequate volume Performed at [name], [address] [place], [place] [address]    Culture   Final    NO GROWTH 5 DAYS Performed at [facility], [address], [place], [place] [address]    Report Status 05/23/2020 FINAL  Final  Blood Culture (routine x 2)     Status: None   Collection Time: 05/18/20  2:00 PM   Specimen: BLOOD LEFT FOREARM  Result Value Ref Range Status   Specimen Description   Final    BLOOD LEFT FOREARM Performed at [name], [address] [place], [place] [address]    Special Requests   Final    BOTTLES DRAWN AEROBIC AND ANAEROBIC Blood Culture adequate volume Performed at [name], [address] [place], [place] [address]    Culture   Final    NO GROWTH 5 DAYS Performed at [facility], [address] [place], [place] [address]    Report Status 05/23/2020 FINAL  Final         Radiology Studies: DG CHEST PORT 1 VIEW  Result Date: 05/23/2020 CLINICAL DATA:  Hypoxia.  COVID. EXAM: PORTABLE CHEST 1 VIEW COMPARISON:  05/18/2020.  06/13/2019.  01/29/2004. FINDINGS: Mediastinum and hilar structures normal. Prior CABG. Heart size normal. Bibasilar infiltrates. Underlying chronic interstitial lung disease. No pleural effusion or pneumothorax.  IMPRESSION: 1. Bibasilar atelectasis/infiltrates. 2. Underlying chronic interstitial lung disease. Electronically Signed   By: Maisie Fus  Register   On: 05/23/2020 06:37        Scheduled Meds:  aspirin  81 mg Per Tube Daily   baricitinib  4 mg Per Tube Daily   enoxaparin (LOVENOX) injection  40 mg Subcutaneous Q24H   feeding supplement (JEVITY 1.5 CAL/FIBER)  237 mL Per Tube TID WC   feeding supplement (NEPRO CARB STEADY)  237 mL Per Tube BID BM   methylPREDNISolone (SOLU-MEDROL) injection  60 mg Intravenous Q12H   pantoprazole sodium  40 mg Per Tube BID   simvastatin  20 mg Oral q1800   Continuous Infusions:   LOS: 6 days    Time spent: over 30 min    Lacretia Nicks, MD [facility]   To contact the attending provider between 7A-7P or the covering provider during after hours 7P-7A, please log into the web site [email or web address] and access using universal  password for that web site. If you do not have the password, please call the hospital operator.  05/24/2020, 12:16 PM

## 2020-05-24 NOTE — Care Management Important Message (Signed)
Important Message  Patient Details IM Letter given to the Patient Name: [name] BACHTEL Sr. MRN: [id number] Date of Birth: 1946-06-07   Medicare Important Message Given:  Yes     Kerin Salen 05/24/2020, 10:51 AM

## 2020-05-24 NOTE — Progress Notes (Signed)
PT Cancellation Note  Patient Details Name: [name] Sr. MRN: [id number] DOB: [birth year]   Cancelled Treatment:    Reason Eval/Treat Not Completed: Fatigue/lethargy limiting ability to participate, patient stated that he was up in the room to the sink. Will check back tomorrow.   Claretha Cooper 05/24/2020, 4:59 PM [place] Pager [phone] Office [phone]

## 2020-05-25 LAB — CBC WITH DIFFERENTIAL/PLATELET
Abs Immature Granulocytes: 0.35 10*3/uL — ABNORMAL HIGH (ref 0.00–0.07)
Basophils Absolute: 0 10*3/uL (ref 0.0–0.1)
Basophils Relative: 1 %
Eosinophils Absolute: 0 10*3/uL (ref 0.0–0.5)
Eosinophils Relative: 0 %
HCT: 41.1 % (ref 39.0–52.0)
Hemoglobin: 13.5 g/dL (ref 13.0–17.0)
Immature Granulocytes: 5 %
Lymphocytes Relative: 6 %
Lymphs Abs: 0.4 10*3/uL — ABNORMAL LOW (ref 0.7–4.0)
MCH: 28.8 pg (ref 26.0–34.0)
MCHC: 32.8 g/dL (ref 30.0–36.0)
MCV: 87.8 fL (ref 80.0–100.0)
Monocytes Absolute: 0.4 10*3/uL (ref 0.1–1.0)
Monocytes Relative: 6 %
Neutro Abs: 5.5 10*3/uL (ref 1.7–7.7)
Neutrophils Relative %: 82 %
Platelets: 123 10*3/uL — ABNORMAL LOW (ref 150–400)
RBC: 4.68 MIL/uL (ref 4.22–5.81)
RDW: 14.3 % (ref 11.5–15.5)
WBC: 6.7 10*3/uL (ref 4.0–10.5)
nRBC: 0 % (ref 0.0–0.2)

## 2020-05-25 LAB — PHOSPHORUS: Phosphorus: 3.4 mg/dL (ref 2.5–4.6)

## 2020-05-25 LAB — COMPREHENSIVE METABOLIC PANEL
ALT: 63 U/L — ABNORMAL HIGH (ref 0–44)
AST: 43 U/L — ABNORMAL HIGH (ref 15–41)
Albumin: 2.4 g/dL — ABNORMAL LOW (ref 3.5–5.0)
Alkaline Phosphatase: 81 U/L (ref 38–126)
Anion gap: 8 (ref 5–15)
BUN: 28 mg/dL — ABNORMAL HIGH (ref 8–23)
CO2: 21 mmol/L — ABNORMAL LOW (ref 22–32)
Calcium: 7.9 mg/dL — ABNORMAL LOW (ref 8.9–10.3)
Chloride: 105 mmol/L (ref 98–111)
Creatinine, Ser: 0.5 mg/dL — ABNORMAL LOW (ref 0.61–1.24)
GFR, Estimated: >60 mL/min (ref >60)
Glucose, Bld: 129 mg/dL — ABNORMAL HIGH (ref 70–99)
Potassium: 4.1 mmol/L (ref 3.5–5.1)
Sodium: 134 mmol/L — ABNORMAL LOW (ref 135–145)
Total Bilirubin: 0.8 mg/dL (ref 0.3–1.2)
Total Protein: 5.4 g/dL — ABNORMAL LOW (ref 6.5–8.1)

## 2020-05-25 LAB — GLUCOSE, CAPILLARY
Glucose-Capillary: 120 mg/dL — ABNORMAL HIGH (ref 70–99)
Glucose-Capillary: 203 mg/dL — ABNORMAL HIGH (ref 70–99)
Glucose-Capillary: 95 mg/dL (ref 70–99)
Glucose-Capillary: 144 mg/dL — ABNORMAL HIGH (ref 70–99)

## 2020-05-25 LAB — MAGNESIUM: Magnesium: 2.6 mg/dL — ABNORMAL HIGH (ref 1.7–2.4)

## 2020-05-25 LAB — HEMOGLOBIN A1C
Hgb A1c MFr Bld: 6 % — ABNORMAL HIGH (ref 4.8–5.6)
Mean Plasma Glucose: 125.5 mg/dL

## 2020-05-25 LAB — FERRITIN: Ferritin: 308 ng/mL (ref 24–336)

## 2020-05-25 LAB — C-REACTIVE PROTEIN: CRP: 0.6 mg/dL (ref <1.0)

## 2020-05-25 LAB — D-DIMER, QUANTITATIVE: D-Dimer, Quant: 0.72 ug/mL-FEU — ABNORMAL HIGH (ref 0.00–0.50)

## 2020-05-25 MED ORDER — DEXAMETHASONE 6 MG PO TABS: 6.0000 mg | ORAL_TABLET | Freq: Every day | ORAL | 0 refills | Status: AC

## 2020-05-25 MED ORDER — NEPRO/CARBSTEADY PO LIQD: 237.0000 mL | Freq: Two times a day (BID) | ORAL | 0 refills | Status: AC

## 2020-05-25 MED ORDER — JEVITY 1.5 CAL/FIBER PO LIQD: 80.0000 mL/h | ORAL | 1 refills | Status: AC

## 2020-05-25 MED ORDER — INSULIN ASPART 100 UNIT/ML ~~LOC~~ SOLN
0.0000 [IU] | Freq: Three times a day (TID) | SUBCUTANEOUS | Status: DC
  Administered 2020-05-25: 1 [IU] via SUBCUTANEOUS
  Administered 2020-05-25: 3 [IU] via SUBCUTANEOUS

## 2020-05-25 NOTE — Discharge Summary (Signed)
Physician Discharge Summary  [name] Center Sr. WFU:[id number] DOB: [birth year] DOA: 05/18/2020  PCP: [name] Flood, MD  Admit date: 05/18/2020 Discharge date: 05/25/2020  Time spent: 40 minutes  Recommendations for Outpatient Follow-up:  1. Follow outpatient CBC/CMP 2. Quarantine per [employer] guidelines (until after Oct 20) 3. Follow outpatient CXR in 3-4 weeks 4. Follow oxygen requirement outpatient, wean as tolerated  5. 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 Diagnosed with COVID-19 on 9/29. Went to infusion center for Regeneron infusion, however he was found to be 75% on room air so transferred to ER. Patient had received his first dose of COVID-19 vaccine, and he was due for next dose.  He was admitted for covid, he's improved gradually with steroids, remdesivir, and baricitinib.  Discharge today with supplemental oxygen and complete course of steroids.  Hospital Course:  Acute hypoxemic respiratory failure due to COVID-19 virus infection in 74 [name] patient with head and neck deformity: Partially vaccinated CXR with chronic interstitial changes L >R on 10/2 Repeat CXR 10/7 with bibasilar atelectasis/infiltrates and chronic interstitial lung disease Worked with OT today, was 90% on RA, desatted  to 85% on RA with activity, required 2 L to maintain oxygen 90-91%.  Will d/c home with 2 L with activity and at night.  Will d/c with steroids to complete course.  Patient does have surgical deformity of the nose and mouth along with the throat, difficult to participate on physical therapy and chest physio, however will continue as much as he can do. Supplemental oxygen to keep saturations more than 85 %. Continue steroids (10/2 - present).  Remdesivir (10/2-10/6).  Baricitinib 10/6 - 10/9. 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  Resume home regiment  Coronary artery disease status post CABG:Continue on aspirin and statin from home.  Sinus Bradycardia: asymptomatic, noted while he was sleeping, follow outpatient  Procedures:  none  Consultations:  none  Discharge Exam: Vitals:   05/25/20 0314 05/25/20 1342  BP: 135/79 125/70  Pulse: (!) 50 (!) 48  Resp:  18  Temp: 98 F (36.7 C) 97.9 F (36.6 C)  SpO2: 90% 100%   Eager to go home, feeling better Discussed with daugther  General: No acute distress. Head/neck deformity noted Cardiovascular: Heart sounds show 74 [name] regular rate, and rhythm.  Lungs: Clear to auscultation bilaterally Abdomen: Soft, nontender, nondistended.  PEG Neurological: Alert and oriented 3. Moves all extremities 4. Cranial nerves II through XII grossly intact. Skin: Warm and dry. No rashes or lesions. Extremities: No clubbing or cyanosis. No edema.  Discharge Instructions   Discharge Instructions    Call MD for:  difficulty breathing, headache or visual disturbances   Complete by: As directed    Call MD for:  extreme fatigue   Complete by: As directed    Call  MD for:  hives   Complete by: As directed    Call MD for:  persistant dizziness or light-headedness   Complete by: As directed    Call MD for:  persistant nausea and vomiting   Complete by: As directed    Call MD for:  redness, tenderness, or signs of infection (pain, swelling, redness, odor or green/yellow discharge around incision site)   Complete by: As directed    Call MD for:  severe uncontrolled pain   Complete by: As directed    Call MD for:  temperature >100.4   Complete by: As directed    Diet - low sodium heart healthy   Complete by: As directed    Discharge instructions   Complete by: As directed    You were seen for covid pneumonia.  You've improved with steroids, remdesivir, and baricitinib.  You will go home with supplemental oxygen.  Wear this with activity and at night.  Please follow up with your PCP to see when this can be weaned.  You will go home with 3 more days of steroids.  Continue to isolate until 06/05/2020.    Resume your home tube feeds.  Return for new, recurrent, or worsening symptoms.  Please ask your PCP to request records from this hospitalization so they know what was done and what the next steps will be.   Free water flush   Complete by: As directed    30 cc before and after each bolus   Increase activity slowly   Complete by: As directed      Allergies as of 05/25/2020      Reactions   Aspartame Hives   Bupropion Anxiety   Other Hives, Itching      Medication List    TAKE these medications   aspirin EC 81 MG tablet Take 81 mg by mouth daily.   dexamethasone 6 MG tablet Commonly known as: Decadron Place 1 tablet (6 mg total) into feeding tube daily for 3 days.   feeding supplement (NEPRO CARB STEADY) Liqd Place 237 mLs into feeding tube 2 (two) times daily between meals.   feeding supplement (JEVITY 1.5 CAL/FIBER) Liqd Place 80 mL/hr into feeding tube continuous. For 10 hours daily from 2100 - 0700   furosemide 20 MG  tablet Commonly known as: LASIX Take 1 tablet (20 mg total) by mouth daily as needed for edema.   Omega 3 1200 MG Caps Take by mouth.   psyllium 58.6 % packet Commonly known as: METAMUCIL Take 1 packet by mouth daily as needed (constipation).   simvastatin 20 MG tablet Commonly known as: ZOCOR Take 1 tablet (20 mg total) by mouth daily at 6 PM.            Durable Medical Equipment  (From admission, onward)         Start     Ordered   05/25/20 1440  For home use only DME oxygen  Once       Comments: Patient satting 90% on RA at rest, but desaturates to 85% on room air with activity.  Patient requires 2 L with activity to maintain O2 saturations >88%  Question Answer Comment  Length of Need 6 Months   Mode or (  Route) Nasal cannula   Liters per Minute 2   Frequency Continuous (stationary and portable oxygen unit needed)   Oxygen conserving device Yes   Oxygen delivery system Gas      05/25/20 1441         Allergies  Allergen Reactions   Aspartame Hives   Bupropion Anxiety   Other Hives and Itching    Follow-up Information    [facility] Follow up.   Why: agency will provide home oxygen Contact information: [address] [address] [phone]                The results of significant diagnostics from this hospitalization (including imaging, microbiology, ancillary and laboratory) are listed below for reference.    Significant Diagnostic Studies: DG CHEST PORT 1 VIEW  Result Date: 05/23/2020 CLINICAL DATA:  Hypoxia.  COVID. EXAM: PORTABLE CHEST 1 VIEW COMPARISON:  05/18/2020.  06/13/2019.  01/29/2004. FINDINGS: Mediastinum and hilar structures normal. Prior CABG. Heart size normal. Bibasilar infiltrates. Underlying chronic interstitial lung disease. No pleural effusion or pneumothorax. IMPRESSION: 1. Bibasilar atelectasis/infiltrates. 2. Underlying chronic interstitial lung disease. Electronically Signed   By:  Maisie Fus  Register   On: 05/23/2020 06:37   DG Chest Port 1 View  Result Date: 05/18/2020 CLINICAL DATA:  Patient coming from infusion center, covid positive. O2 was 76% on RA at infusion center, 93% on 6L. H/o CAD, HTN. Former smoker. EXAM: PORTABLE CHEST - 1 VIEW COMPARISON:  06/13/2019 FINDINGS: Coarse interstitial opacities in the lung bases left greater than right, perhaps slightly more conspicuous than on prior study. No new airspace consolidation. No overt edema. Heart size and mediastinal contours are within normal limits. Previous CABG. No effusion.  No pneumothorax. Previous median sternotomy. IMPRESSION: Chronic interstitial changes, left greater than right. No acute disease. Electronically Signed   By: Corlis Leak M.D.   On: 05/18/2020 14:27    Microbiology: Recent Results (from the past 240 hour(s))  Blood Culture (routine x 2)     Status: None   Collection Time: 05/18/20  1:55 PM   Specimen: BLOOD LEFT WRIST  Result Value Ref Range Status   Specimen Description   Final    BLOOD LEFT WRIST Performed at [facility], [address] [place], [place] [address]    Special Requests   Final    BOTTLES DRAWN AEROBIC AND ANAEROBIC Blood Culture adequate volume Performed at [facility], [address] [place], [place] [address]    Culture   Final    NO GROWTH 5 DAYS Performed at [facility], [address], [place], [place] [address]    Report Status 05/23/2020 FINAL  Final  Blood Culture (routine x 2)     Status: None   Collection Time: 05/18/20  2:00 PM   Specimen: BLOOD LEFT FOREARM  Result Value Ref Range Status   Specimen Description   Final    BLOOD LEFT FOREARM Performed at [facility], [address] [place], [place] [address]    Special Requests   Final    BOTTLES DRAWN AEROBIC AND ANAEROBIC Blood Culture adequate volume Performed at [facility], [address], [place], [place] [address]    Culture   Final     NO GROWTH 5 DAYS Performed at [facility], [address] [place], [place] [address]    Report Status 05/23/2020 FINAL  Final     Labs: Basic  Metabolic Panel: Recent Labs  Lab 05/21/20 0518 05/22/20 0422 05/23/20 0402 05/24/20 0903 05/25/20 0436  NA 143 138 137 136 134*  K 4.6 3.8 4.1 4.0 4.1  CL 112* 111 109 104 105  CO2 22 21* 21* 21* 21*  GLUCOSE 148* 122* 126* 164* 129*  BUN 28* 27* 29* 28* 28*  CREATININE 0.66 0.55* 0.48* 0.52* 0.50*  CALCIUM 8.2* 8.1* 7.8* 8.3* 7.9*  MG 2.5* 2.6* 2.4 2.3 2.6*  PHOS  --   --   --  2.6 3.4   Liver Function Tests: Recent Labs  Lab 05/21/20 0518 05/22/20 0422 05/23/20 0402 05/24/20 0903 05/25/20 0436  AST 115* 103* 63* 47* 43*  ALT 82* 94* 78* 71* 63*  ALKPHOS 85 80 86 95 81  BILITOT 0.9 0.9 0.8 0.9 0.8  PROT 6.8 6.7 6.0* 6.0* 5.4*  ALBUMIN 2.8* 2.7* 2.5* 2.6* 2.4*   No results for input(s): LIPASE, AMYLASE in the last 168 hours. No results for input(s): AMMONIA in the last 168 hours. CBC: Recent Labs  Lab 05/21/20 0518 05/22/20 0422 05/23/20 0402 05/24/20 0903 05/25/20 0436  WBC 9.6 7.9 6.2 8.5 6.7  NEUTROABS 8.3* 6.7 4.9 6.9 5.5  HGB 14.2 13.7 13.4 14.2 13.5  HCT 45.1 42.8 41.3 43.1 41.1  MCV 91.9 89.7 88.6 86.7 87.8  PLT 213 PLATELET CLUMPS NOTED ON SMEAR, UNABLE TO ESTIMATE 160 211 123*   Cardiac Enzymes: No results for input(s): CKTOTAL, CKMB, CKMBINDEX, TROPONINI in the last 168 hours. BNP: BNP (last 3 results) No results for input(s): BNP in the last 8760 hours.  ProBNP (last 3 results) No results for input(s): PROBNP in the last 8760 hours.  CBG: Recent Labs  Lab 05/24/20 2014 05/24/20 2349 05/25/20 0356 05/25/20 0731 05/25/20 1111  GLUCAP 153* 119* 120* 203* 95       Signed:  Lacretia Nicks MD.  [facility] 05/25/2020, 3:48 PM

## 2020-05-25 NOTE — Evaluation (Signed)
Physical Therapy Evaluation Patient Details Name: [name] YOUNGDAHL Sr. 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  Went to infusion center for Regeneron infusion, however he was found to be 75% on room air so transferred to ER. Admitted fo Acute hypoxemic respiratory failure due to COVID-19  Clinical Impression  On eval, pt was Min guard for mobility. He walked ~350 feet around the unit. O2 86%, dyspnea 2/4 on RA during session. Pt tolerated activity fairly well. He has been very activity on today. Will likely need pulmonary O2 sat assessment again prior to d/c (possibly tmrw per pt). Do not anticipate any f/u PT needs at discharge. Will plan to follow during hospital stay.     Follow Up Recommendations No PT follow up    Equipment Recommendations  None recommended by PT    Recommendations for Other Services       Precautions / Restrictions Precautions Precautions: None Precaution Comments: monitor O2 Restrictions Weight Bearing Restrictions: No      Mobility  Bed Mobility Overal bed mobility: Independent                Transfers Overall transfer level: Needs assistance Equipment used: None Transfers: Sit to/from Stand Sit to Stand: Supervision Stand pivot transfers: Min guard       General transfer comment: min guard for ambulation in room and in hallway for safety.  Ambulation/Gait Ambulation/Gait assistance: Min guard Gait Distance (Feet): 350 Feet Assistive device:  (pushing O2 carrier) Gait Pattern/deviations: Step-through pattern;Decreased stride length      General Gait Details: Intermittent unsteadiness but no overt LOB. O2 86% on RA, dyspnea 2/4  Stairs            Wheelchair Mobility    Modified Rankin (Stroke Patients Only)       Balance Overall balance assessment: Mild deficits observed, not formally tested                                           Pertinent Vitals/Pain Pain Assessment: No/denies pain    Home Living Family/patient expects to be discharged to:: Private residence Living Arrangements: Children Available Help at Discharge: Family;Available 24 hours/day Type of Home: Apartment Home Access: Level entry     Home Layout: One level Home Equipment: Shower seat      Prior Function Level of Independence: Independent               Hand Dominance   Dominant Hand: Right    Extremity/Trunk Assessment   Upper Extremity Assessment Upper Extremity Assessment: Defer to OT evaluation    Lower Extremity Assessment Lower Extremity Assessment: Generalized weakness    Cervical / Trunk Assessment Cervical / Trunk Assessment: Normal  Communication   Communication: No difficulties  Cognition Arousal/Alertness: Awake/alert Behavior During Therapy: WFL for tasks assessed/performed Overall Cognitive Status: Within Functional Limits for tasks assessed  General Comments      Exercises     Assessment/Plan    PT Assessment Patient needs continued PT services  PT Problem List Decreased activity tolerance       PT Treatment Interventions Gait training;Therapeutic activities;Therapeutic exercise;Patient/family education;Functional mobility training    PT Goals (Current goals can be found in the Care Plan section)  Acute Rehab PT Goals Patient Stated Goal: to go home soon and to not need O2 PT Goal Formulation: With patient Time For Goal Achievement: 06/08/20 Potential to Achieve Goals: Good    Frequency Min 3X/week    Barriers to discharge        Co-evaluation               AM-PAC PT "6 Clicks" Mobility  Outcome Measure Help needed turning from your back to your side while in a flat bed without using bedrails?: None Help needed moving from lying on your back to sitting on the side of a flat bed without using bedrails?: None Help needed moving to and from a bed to a chair (including a wheelchair)?: None Help needed standing up from a chair using your arms (e.g., wheelchair or bedside chair)?: None Help needed to walk in hospital room?: A Little Help needed climbing 3-5 steps with a railing? : A Little 6 Click Score: 22    End of Session   Activity Tolerance: Patient tolerated treatment well Patient left: in bed;with call bell/phone within reach   PT Visit Diagnosis: Unsteadiness on feet (R26.81)    Time: 1445-1500 PT Time Calculation (min) (ACUTE ONLY): 15 min   Charges:   PT Evaluation $PT Eval Low Complexity: 1 Low            Faye Ramsay, PT Acute Rehabilitation  Office: [phone] Pager: [phone]

## 2020-05-25 NOTE — TOC Progression Note (Signed)
Transition of Care (TOC) - Progression Note    Patient Details  Name: [name] BANKA Sr. MRN: [id number] Date of Birth: 1945/09/25  Transition of Care [facility]) CM/SW Contact  Joaquin Courts, RN Phone Number: 05/25/2020, 3:03 PM  Clinical Narrative: [employer] to provide home oxygen.       Expected Discharge Plan: Home/Self Care Barriers to Discharge: Continued Medical Work up  Expected Discharge Plan and Services Expected Discharge Plan: Home/Self Care                         DME Arranged: Oxygen DME Agency: [facility] Date DME Agency Contacted: 05/25/20 Time DME Agency Contacted: 1502 Representative spoke with at DME Agency: on-call referral line             Social Determinants of Health (SDOH) Interventions    Readmission Risk Interventions No flowsheet data found.

## 2020-05-25 NOTE — Progress Notes (Signed)
Pt has sustained HR between 37-40 bpm while asleep. When awaken to take vital signs, BP:135/79 and HR:50. Pt denies any symptoms and is resting comfortably in bed.

## 2020-05-25 NOTE — Evaluation (Signed)
Occupational Therapy Evaluation Patient Details Name: [name]. 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  Went to infusion center for Regeneron infusion, however he was found to be 75% on room air so transferred to ER. Admitted fo Acute hypoxemic respiratory failure due to COVID-19   Clinical Impression   [name] is a 74 year old man admitted to hospital with COVID and who presents supine in bed on 3L [place] at 92%. Patient transferred to side of bed and then performed grooming task at sink x 7 minutes. O2 sat maintained at 92-93%. Patient ambulated to bathroom (approx 24 feet) performed toilet transfer and returned to recliner on RA and o2 sat dropped to 85%. Patient able to recover to 90% on RA with increased time. Patient placed on 2L [place]. Patient ambulated 100 feet in hallway on 2L and maintained between 90-91%. Patient reports no shortness of breath or significant fatigue and reports he feels fine. Patient appears to be near his baseline in regards to functional mobility and ADLs -  except for needing oxygen. NO OT needs at this time.    Follow Up Recommendations  No OT follow up    Equipment Recommendations  None recommended by OT    Recommendations for Other Services       Precautions / Restrictions Precautions Precautions: None Restrictions Weight Bearing Restrictions: No      Mobility Bed Mobility Overal bed mobility: Independent                Transfers Overall transfer level: Needs assistance Equipment used: None Transfers: Sit to/from Stand;Stand  Pivot Transfers Sit to Stand: Min guard Stand pivot transfers: Min guard       General transfer comment: min guard for ambulation in room and in hallway for safety.    Balance Overall balance assessment: Mild deficits observed, not formally tested                                         ADL either performed or assessed with clinical judgement   ADL                                         General ADL Comments: Able to perform ADLs with increased time but no physical assistance. Therapist provided supervision for safety and to monitor o2 sats.     Vision Patient Visual Report: No change from baseline Vision Assessment?: No apparent visual deficits     Perception     Praxis      Pertinent Vitals/Pain Pain Assessment: No/denies pain     Hand Dominance Right   Extremity/Trunk Assessment Upper Extremity Assessment Upper Extremity Assessment: Overall WFL for tasks assessed   Lower Extremity Assessment Lower Extremity Assessment: Defer to PT evaluation   Cervical / Trunk Assessment Cervical / Trunk Assessment: Normal   Communication Communication Communication: No difficulties   Cognition Arousal/Alertness: Awake/alert Behavior During Therapy: WFL for tasks assessed/performed Overall Cognitive Status: Within  Functional Limits for tasks assessed                                     General Comments       Exercises     Shoulder Instructions      Home Living Family/patient expects to be discharged to:: Private residence Living Arrangements: Children (son and his son's girlfriend) Available Help at Discharge: Family;Available 24 hours/day Type of Home:  (Condo) Home Access: Level entry     Home Layout: One level     Bathroom Shower/Tub: Producer, television/film/video: Standard     Home Equipment: Shower seat          Prior Functioning/Environment Level of Independence: Independent                  OT Problem List: Cardiopulmonary status limiting activity      OT Treatment/Interventions:      OT Goals(Current goals can be found in the care plan section) Acute Rehab OT Goals Patient Stated Goal: to go home soon OT Goal Formulation: All assessment and education complete, DC therapy  OT Frequency:     Barriers to D/C:            Co-evaluation              AM-PAC OT "6 Clicks" Daily Activity     Outcome Measure Help from another person eating meals?: None Help from another person taking care of personal grooming?: None Help from another person toileting, which includes using toliet, bedpan, or urinal?: None Help from another person bathing (including washing, rinsing, drying)?: None Help from another person to put on and taking off regular upper body clothing?: None Help from another person to put on and taking off regular lower body clothing?: None 6 Click Score: 24   End of Session Equipment Utilized During Treatment: Oxygen Nurse Communication: Mobility status  Activity Tolerance: Patient tolerated treatment well Patient left: in chair;with call bell/phone within reach  OT Visit Diagnosis: Muscle weakness (generalized) (M62.81)                Time: 5409-8119 OT Time Calculation (min): 39 min Charges:  OT General Charges $OT Visit: 1 Visit OT Evaluation $OT Eval Low Complexity: 1 Low OT Treatments $Self Care/Home Management : 8-22 mins $Therapeutic Activity: 8-22 mins  Haly Feher, OTR/L Acute Care Rehab Services  Office [phone] Pager: [phone]   Kelli Churn 05/25/2020, 1:58 PM

## 2020-05-25 NOTE — Progress Notes (Signed)
SATURATION QUALIFICATIONS: (This note is used to comply with regulatory documentation for home oxygen)  Patient Saturations on Room Air at Rest = 90%  Patient Saturations on Room Air while Ambulating = 85%  Patient Saturations on 2 Liters of oxygen while Ambulating = 93%  Please briefly explain why patient needs home oxygen: Patient requires 2L of oxygen to maintain O2 saturations at an acceptable level while ambulating.

## 2020-05-25 NOTE — Discharge Instructions (Signed)
10 Things You Can Do to Manage Your COVID-19 Symptoms at Home If you have possible or confirmed COVID-19: 1. Stay home from work and school. And stay away from other public places. If you must go out, avoid using any kind of public transportation, ridesharing, or taxis. 2. Monitor your symptoms carefully. If your symptoms get worse, call your healthcare provider immediately. 3. Get rest and stay hydrated. 4. If you have a medical appointment, call the healthcare provider ahead of time and tell them that you have or may have COVID-19. 5. For medical emergencies, call 911 and notify the dispatch personnel that you have or may have COVID-19. 6. Cover your cough and sneezes with a tissue or use the inside of your elbow. 7. Wash your hands often with soap and water for at least 20 seconds or clean your hands with an alcohol-based hand sanitizer that contains at least 60% alcohol. 8. As much as possible, stay in a specific room and away from other people in your home. Also, you should use a separate bathroom, if available. If you need to be around other people in or outside of the home, wear a mask. 9. Avoid sharing personal items with other people in your household, like dishes, towels, and bedding. 10. Clean all surfaces that are touched often, like counters, tabletops, and doorknobs. Use household cleaning sprays or wipes according to the label instructions. [email or web address].com 02/15/2019 This information is not intended to replace advice given to you by your health care provider. Make sure you discuss any questions you have with your health care provider. Document Revised: 07/20/2019 Document Reviewed: 07/20/2019 Elsevier Patient Education  [place].  COVID-19: How to Protect Yourself and Others Know how it spreads  There is currently no vaccine to prevent coronavirus disease 2019 (COVID-19).  The best way to prevent illness is to avoid being exposed to this virus.  The virus is  thought to spread mainly from person-to-person. ? Between people who are in close contact with one another (within about 6 feet). ? Through respiratory droplets produced when an infected person coughs, sneezes or talks. ? These droplets can land in the mouths or noses of people who are nearby or possibly be inhaled into the lungs. ? COVID-19 may be spread by people who are not showing symptoms. Everyone should Clean your hands often  Wash your hands often with soap and water for at least 20 seconds especially after you have been in a public place, or after blowing your nose, coughing, or sneezing.  If soap and water are not readily available, use a hand sanitizer that contains at least 60% alcohol. Cover all surfaces of your hands and rub them together until they feel dry.  Avoid touching your eyes, nose, and mouth with unwashed hands. Avoid close contact  Limit contact with others as much as possible.  Avoid close contact with people who are sick.  Put distance between yourself and other people. ? Remember that some people without symptoms may be able to spread virus. ? This is especially important for people who are at higher risk of getting very [employer]ain[employer].cy Cover your mouth and nose with a mask when around others  You could spread COVID-19 to others even if you do not feel sick.  Everyone should wear a mask in public settings and when around people not living in their household, especially when social distancing is difficult to maintain. ? Masks should not be placed on young children under age 48, anyone who  has trouble breathing, or is unconscious, incapacitated or otherwise unable to remove the mask without assistance.  The mask is meant to protect other people in case you are infected.  Do NOT use a facemask meant for a Dietitian.  Continue to keep about 6 feet between yourself and others. The  mask is not a substitute for social distancing. Cover coughs and sneezes  Always cover your mouth and nose with a tissue when you cough or sneeze or use the inside of your elbow.  Throw used tissues in the trash.  Immediately wash your hands with soap and water for at least 20 seconds. If soap and water are not readily available, clean your hands with a hand sanitizer that contains at least 60% alcohol. Clean and disinfect  Clean AND disinfect frequently touched surfaces daily. This includes tables, doorknobs, light switches, countertops, handles, desks, phones, keyboards, toilets, faucets, and sinks. RackRewards[employer]fr  If surfaces are dirty, clean them: Use detergent or soap and water prior to disinfection.  Then, use a household disinfectant. You can see a list of EPA-registered household disinfectants here. [email or web address].com 04/19/2019 This information is not intended to replace advice given to you by your health care provider. Make sure you discuss any questions you have with your health care provider. Document Revised: 04/27/2019 Document Reviewed: 02/23/2019 Elsevier Patient Education  [place].

## 2020-05-28 ENCOUNTER — Telehealth: Payer: Self-pay | Admitting: *Deleted

## 2020-05-28 NOTE — Telephone Encounter (Signed)
Schedule a hospital follow  Left message to call Almyra Free

## 2020-06-04 ENCOUNTER — Encounter: Payer: Medicare HMO | Admitting: Gastroenterology

## 2020-06-07 DIAGNOSIS — C44329 Squamous cell carcinoma of skin of other parts of face: Secondary | ICD-10-CM | POA: Diagnosis not present

## 2020-06-07 DIAGNOSIS — R627 Adult failure to thrive: Secondary | ICD-10-CM | POA: Diagnosis not present

## 2020-06-07 DIAGNOSIS — R1312 Dysphagia, oropharyngeal phase: Secondary | ICD-10-CM | POA: Diagnosis not present

## 2020-06-09 DIAGNOSIS — C44329 Squamous cell carcinoma of skin of other parts of face: Secondary | ICD-10-CM | POA: Diagnosis not present

## 2020-06-09 DIAGNOSIS — R1312 Dysphagia, oropharyngeal phase: Secondary | ICD-10-CM | POA: Diagnosis not present

## 2020-06-09 DIAGNOSIS — U071 COVID-19: Secondary | ICD-10-CM | POA: Diagnosis not present

## 2020-06-09 DIAGNOSIS — R627 Adult failure to thrive: Secondary | ICD-10-CM | POA: Diagnosis not present

## 2020-06-10 ENCOUNTER — Other Ambulatory Visit: Payer: Self-pay

## 2020-06-10 ENCOUNTER — Telehealth (INDEPENDENT_AMBULATORY_CARE_PROVIDER_SITE_OTHER): Payer: Medicare HMO | Admitting: Family Medicine

## 2020-06-10 VITALS — Ht 70.0 in

## 2020-06-10 DIAGNOSIS — R040 Epistaxis: Secondary | ICD-10-CM | POA: Diagnosis not present

## 2020-06-10 DIAGNOSIS — U071 COVID-19: Secondary | ICD-10-CM | POA: Diagnosis not present

## 2020-06-10 DIAGNOSIS — R001 Bradycardia, unspecified: Secondary | ICD-10-CM

## 2020-06-10 DIAGNOSIS — J9601 Acute respiratory failure with hypoxia: Secondary | ICD-10-CM

## 2020-06-10 NOTE — Progress Notes (Deleted)
Subjective:  Patient ID: [name] Fuelling., male    DOB: 1945/12/23  Age: 74 y.o. MRN: [id number]  CC:  Chief Complaint  Patient presents with  . covid-19    Pt reports he is feeling better.  . possible oral infection    Pt repots he he thinks he may have an oral in fection.    HPI [name] PRESTO Sr. presents for   Hospital follow-up, COVID-19 infection with acute hypoxic respiratory failure. Admitted October 2 through October 9.Diagnosed with COVID-19 on September 29.  Went to infusion center for Regeneron, O2 sat 75% on room air.  Transfer to ER.  Partially vaccinated, had received first dose of COVID-19 vaccine, due for second dose. Ultimately was improving gradually with steroids, remdesivir, baricitinib.  Discharged on supplemental oxygen and completion of steroid course on 05/25/2020.  Quarantine per CDC guidelines until after October 20, outpatient chest x-ray in 3 to 4 weeks planned, plan for O2 weaning as tolerated, plan for outpatient sinus bradycardia monitoring although asymptomatic.    History Patient Active Problem List   Diagnosis Date Noted  . Acute hypoxemic respiratory failure due to COVID-19 (HCC) 05/18/2020  . Hypotension 05/18/2020  . CAD s/p CABG 2005 07/04/2013  . Hyperlipidemia 07/04/2013  . HTN (hypertension) 07/04/2013  . Sinus bradycardia 07/04/2013   Past Medical History:  Diagnosis Date  . 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 Laneta Simmers  . HTN (hypertension) 07/04/2013  . Hyperlipidemia 07/04/2013   Past Surgical History:  Procedure Laterality Date  . CARDIAC CATHETERIZATION  01/15/2004  . CORONARY ARTERY BYPASS GRAFT  01/16/2004   CABG's x4  . HERNIA REPAIR N/A    Phreesia 01/27/2020  . NM MYOCAR PERF WALL MOTION  01/16/2010   EF 65%,Mets 10   Allergies  Allergen Reactions  . Aspartame Hives  . Bupropion Anxiety  . Other Hives and Itching   Prior to  Admission medications   Medication Sig Start Date End Date Taking? Authorizing Provider  aspirin EC 81 MG tablet Take 81 mg by mouth daily.   Yes [provider]  Nutritional Supplements (FEEDING SUPPLEMENT, NEPRO CARB STEADY,) LIQD Place 237 mLs into feeding tube 2 (two) times daily between meals. 05/25/20 06/24/20 Yes Zigmund Daniel., MD  psyllium (METAMUCIL) 58.6 % packet Take 1 packet by mouth daily as needed (constipation).   Yes [provider]  simvastatin (ZOCOR) 20 MG tablet Take 1 tablet (20 mg total) by mouth daily at 6 PM. 01/29/20  Yes Croitoru, Mihai, MD  furosemide (LASIX) 20 MG tablet Take 1 tablet (20 mg total) by mouth daily as needed for edema. 02/08/18 05/09/18  Croitoru, Mihai, MD  Nutritional Supplements (FEEDING SUPPLEMENT, JEVITY 1.5 CAL/FIBER,) LIQD Place 80 mL/hr into feeding tube continuous. For 10 hours daily from 2100 - 0700 05/25/20 06/24/20  Zigmund Daniel., MD  Omega 3 1200 MG CAPS Take by mouth. Patient not taking: Reported on 02/28/2020    [provider]   Social History   Socioeconomic History  . Marital status: Divorced    Spouse name: Not on file  . Number of children: Not on file  . Years of education: Not on file  . Highest education level: Not on file  Occupational History  . Not on file  Tobacco Use  . Smoking status: Former Smoker    Quit date: 08/16/2005  Years since quitting: 14.8  . Smokeless tobacco: Never Used  Substance and Sexual Activity  . Alcohol use: Yes    Comment: rarely  . Drug use: No  . Sexual activity: Not on file  Other Topics Concern  . Not on file  Social History Narrative  . Not on file   Social Determinants of Health   Financial Resource Strain:   . Difficulty of Paying Living Expenses: Not on file  Food Insecurity:   . Worried About Programme researcher, broadcasting/film/video in the Last Year: Not on file  . Ran Out of Food in the Last Year: Not on file  Transportation Needs:   . Lack of  Transportation (Medical): Not on file  . Lack of Transportation (Non-Medical): Not on file  Physical Activity:   . Days of Exercise per Week: Not on file  . Minutes of Exercise per Session: Not on file  Stress:   . Feeling of Stress : Not on file  Social Connections:   . Frequency of Communication with Friends and Family: Not on file  . Frequency of Social Gatherings with Friends and Family: Not on file  . Attends Religious Services: Not on file  . Active Member of Clubs or Organizations: Not on file  . Attends Banker Meetings: Not on file  . Marital Status: Not on file  Intimate Partner Violence:   . Fear of Current or Ex-Partner: Not on file  . Emotionally Abused: Not on file  . Physically Abused: Not on file  . Sexually Abused: Not on file    Review of Systems   Objective:   Vitals:   06/10/20 1028  Height: 5\' 10"  (1.778 m)     Physical Exam     Assessment & Plan:  [name] GLENDENNING Sr. is a 74 y.o. male . No diagnosis found.   No orders of the defined types were placed in this encounter.  Patient Instructions       If you have lab work done today you will be contacted with your lab results within the next 2 weeks.  If you have not heard from [place] then please contact us. The fastest way to get your results is to register for My Chart.   IF you received an x-ray today, you will receive an invoice from [facility]. Please contact [facility] at [phone] with questions or concerns regarding your invoice.   IF you received labwork today, you will receive an invoice from [place]. Please contact [facility] at [phone] with questions or concerns regarding your invoice.   Our billing staff will not be able to assist you with questions regarding bills from these companies.  You will be contacted with the lab results as soon as they are available. The fastest way to get your results is to activate your My Chart account. Instructions  are located on the last page of this paperwork. If you have not heard from [place] regarding the results in 2 weeks, please contact this office.         Signed, Meredith Staggers, MD [facility]

## 2020-06-10 NOTE — Patient Instructions (Addendum)
  I'm glad to hear that you are better. Can try spraying saline nasal spray in passages if not causing too much discharge into mouth. If bleeding in nose returns, please be seen here in person, or urgent care (or emergency room) if needed for persistent or worsening bleeding.   Follow up in office in next 7-10 days.  Return to the clinic or go to the nearest emergency room if any of your symptoms worsen or new symptoms occur.  Thanks for taking my call today. Please let me know if there are questions.    If you have lab work done today you will be contacted with your lab results within the next 2 weeks.  If you have not heard from [place] then please contact us. The fastest way to get your results is to register for My Chart.   IF you received an x-ray today, you will receive an invoice from [facility]. Please contact [facility] at [phone] with questions or concerns regarding your invoice.   IF you received labwork today, you will receive an invoice from [place]. Please contact [facility] at [phone] with questions or concerns regarding your invoice.   Our billing staff will not be able to assist you with questions regarding bills from these companies.  You will be contacted with the lab results as soon as they are available. The fastest way to get your results is to activate your My Chart account. Instructions are located on the last page of this paperwork. If you have not heard from [place] regarding the results in 2 weeks, please contact this office.

## 2020-06-10 NOTE — Progress Notes (Signed)
Virtual Visit via Telephone Note  I connected with [name]. on 06/10/20 at 6:15 PM  10 min chart review.  by telephone and verified that I am speaking with the correct person using two identifiers. Patient location:home My location: office   I discussed the limitations, risks, security and privacy concerns of performing an evaluation and management service by telephone and the availability of in person appointments. I also discussed with the patient that there may be a patient responsible charge related to this service. The patient expressed understanding and agreed to proceed, consent obtained.   Chief complaint: Chief Complaint  Patient presents with  . covid-19    Pt reports he is feeling better.  . possible oral infection    Pt repots he he thinks he may have an oral in fection.   History of Present Illness:  Hospital follow-up, COVID-19 infection with acute hypoxic respiratory failure. Admitted October 2 through October 9.Diagnosed with COVID-19 on September 29.  Went to infusion center for Regeneron, O2 sat 75% on room air.  Transfer to ER.  Partially vaccinated, had received first dose of COVID-19 vaccine, due for second dose. Ultimately was improving gradually with steroids, remdesivir, baricitinib.  Discharged on supplemental oxygen and completion of steroid course on 05/25/2020.  Quarantine per CDC guidelines until after October 20, outpatient chest x-ray in 3 to 4 weeks planned, plan for O2 weaning as tolerated, plan for outpatient sinus bradycardia monitoring although asymptomatic.  Has been doing a little bit better every day.  Trying to stay active - has been out for a walk.  Had second covid shot on 10/20. Flu shot that day.  Weaned off oxygen since 10/22. No dyspnea since that time.  No cough.  Using feeding pump - working ok.   Has had some bleeding from nose - noted 2 days after he came home,and then 2 days ago. More blood the first time. Episode 2 nights  ago not enough to soak 1 kleenex.  None yesterday or today. Was using oxygen tubing in nose until 06/07/20. No new pain in face, no discolored discharge, no new HA, no fever.  Has not used saline spray recently recently. Difficult to use with hole in nasal passage  Runs into mouth.   Previous time this occurred had an infection in mouth/nose when this occurred earlier in the year - having teeth pulled at that time. Prescribed antibiotic and chlorhexidine at that time.      Patient Active Problem List   Diagnosis Date Noted  . Acute hypoxemic respiratory failure due to COVID-19 ([place]) 05/18/2020  . Hypotension 05/18/2020  . CAD s/p CABG 2005 07/04/2013  . Hyperlipidemia 07/04/2013  . HTN (hypertension) 07/04/2013  . Sinus bradycardia 07/04/2013   Past Medical History:  Diagnosis Date  . 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 Cyndia Bent  . HTN (hypertension) 07/04/2013  . Hyperlipidemia 07/04/2013   Past Surgical History:  Procedure Laterality Date  . CARDIAC CATHETERIZATION  01/15/2004  . CORONARY ARTERY BYPASS GRAFT  01/16/2004   CABG's x4  . HERNIA REPAIR N/A    Phreesia 01/27/2020  . NM MYOCAR PERF WALL MOTION  01/16/2010   EF 65%,Mets 10   Allergies  Allergen Reactions  . Aspartame Hives  . Bupropion Anxiety  . Other Hives and Itching   Prior to Admission medications   Medication Sig Start Date End Date Taking?  Authorizing Provider  aspirin EC 81 MG tablet Take 81 mg by mouth daily.   Yes [provider]  Nutritional Supplements (FEEDING SUPPLEMENT, NEPRO CARB STEADY,) LIQD Place 237 mLs into feeding tube 2 (two) times daily between meals. 05/25/20 06/24/20 Yes Elodia Florence., MD  psyllium (METAMUCIL) 58.6 % packet Take 1 packet by mouth daily as needed (constipation).   Yes [provider]  simvastatin (ZOCOR) 20 MG tablet Take 1 tablet (20 mg total) by mouth daily at 6  PM. 01/29/20  Yes Croitoru, Mihai, MD  furosemide (LASIX) 20 MG tablet Take 1 tablet (20 mg total) by mouth daily as needed for edema. 02/08/18 05/09/18  Croitoru, Mihai, MD  Nutritional Supplements (FEEDING SUPPLEMENT, JEVITY 1.5 CAL/FIBER,) LIQD Place 80 mL/hr into feeding tube continuous. For 10 hours daily from 2100 - 0700 05/25/20 06/24/20  Elodia Florence., MD  Omega 3 1200 MG CAPS Take by mouth. Patient not taking: Reported on 02/28/2020    [provider]   Social History   Socioeconomic History  . Marital status: Divorced    Spouse name: Not on file  . Number of children: Not on file  . Years of education: Not on file  . Highest education level: Not on file  Occupational History  . Not on file  Tobacco Use  . Smoking status: Former Smoker    Quit date: 08/16/2005    Years since quitting: 14.8  . Smokeless tobacco: Never Used  Substance and Sexual Activity  . Alcohol use: Yes    Comment: rarely  . Drug use: No  . Sexual activity: Not on file  Other Topics Concern  . Not on file  Social History Narrative  . Not on file   Social Determinants of Health   Financial Resource Strain:   . Difficulty of Paying Living Expenses: Not on file  Food Insecurity:   . Worried About Charity fundraiser in the Last Year: Not on file  . Ran Out of Food in the Last Year: Not on file  Transportation Needs:   . Lack of Transportation (Medical): Not on file  . Lack of Transportation (Non-Medical): Not on file  Physical Activity:   . Days of Exercise per Week: Not on file  . Minutes of Exercise per Session: Not on file  Stress:   . Feeling of Stress : Not on file  Social Connections:   . Frequency of Communication with Friends and Family: Not on file  . Frequency of Social Gatherings with Friends and Family: Not on file  . Attends Religious Services: Not on file  . Active Member of Clubs or Organizations: Not on file  . Attends Archivist Meetings: Not on file    . Marital Status: Not on file  Intimate Partner Violence:   . Fear of Current or Ex-Partner: Not on file  . Emotionally Abused: Not on file  . Physically Abused: Not on file  . Sexually Abused: Not on file     Observations/Objective: Vitals:   06/10/20 1028  Height: 5\' 10"  (1.778 m)  Speaking in complete sentences, no distress.  All questions were answered with understanding of plan expressed.   Assessment and Plan: Acute hypoxemic respiratory failure due to COVID-19 [facility])  -Status post COVID-19 with respiratory failure, treatment as above.  Has continued to improve since hospital discharge, off oxygen.  Plan for an office recheck in the next week to 10 days for repeat x-ray.  Can repeat CMP,  CBC at that time as well to recheck platelets that were slightly low previously.  Also to recheck borderline hyponatremia, elevated LFTs.  Likely reactive to infection.  RTC/ER precautions if worsening.  Sinus bradycardia  -Asymptomatic, noted in hospital, will discuss further at office visit.  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  Suspect bleeding may have been related to irritation from nasal cannula oxygen.  Initial episode was worse than episode 2 days ago.  Minimal bleeding at that time reported.  Can try saline nasal spray.  This may be difficult with his fistula.  In office or urgent care eval discussed if he does have some slight recurrence with ER precautions if acute worsening bleeding.  Potentially may need ENT evaluation as well.  Follow Up Instructions:  Patient Instructions    I'm glad to hear that you are better. Can try spraying saline nasal spray in passages if not causing too much discharge into mouth. If bleeding in nose returns, please be seen here in person, or urgent care (or emergency room) if needed for persistent or worsening bleeding.   Follow up in office in next 7-10 days.  Return to the clinic or go to the nearest  emergency room if any of your symptoms worsen or new symptoms occur.  Thanks for taking my call today. Please let me know if there are questions.    If you have lab work done today you will be contacted with your lab results within the next 2 weeks.  If you have not heard from [place] then please contact us. The fastest way to get your results is to register for My Chart.   IF you received an x-ray today, you will receive an invoice from [facility]. Please contact [facility] at [phone] with questions or concerns regarding your invoice.   IF you received labwork today, you will receive an invoice from [place]. Please contact [facility] at [phone] with questions or concerns regarding your invoice.   Our billing staff will not be able to assist you with questions regarding bills from these companies.  You will be contacted with the lab results as soon as they are available. The fastest way to get your results is to activate your My Chart account. Instructions are located on the last page of this paperwork. If you have not heard from [place] regarding the results in 2 weeks, please contact this office.       I discussed the assessment and treatment plan with the patient. The patient was provided an opportunity to ask questions and all were answered. The patient agreed with the plan and demonstrated an understanding of the instructions.   The patient was advised to call back or seek an in-person evaluation if the symptoms worsen or if the condition fails to improve as anticipated.  I provided 26 minutes of non-face-to-face time during this encounter. 56min chart review.   Signed,   Merri Ray, MD [facility] [place].  06/10/20

## 2020-06-11 ENCOUNTER — Telehealth: Payer: Self-pay | Admitting: Family Medicine

## 2020-06-11 NOTE — Telephone Encounter (Signed)
06/11/2020 - PATIENT HAD A VIRTUAL APPOINTMENT WITH DR. Carlota Raspberry ON Monday (06/10/2020). DR. Carlota Raspberry HAS REQUESTED MR. Mcgath RETURN IN THE OFFICE IN 1 WEEK TO RECHECK HIS CXR AND A COVID 19 FOLLOW-UP. I TRIED TO CALL AND SCHEDULE BUT HAD TO LEAVE A MESSAGE ON HIS VOICE MAIL TO RETURN MY CALL. [place]

## 2020-06-25 DIAGNOSIS — C44329 Squamous cell carcinoma of skin of other parts of face: Secondary | ICD-10-CM | POA: Diagnosis not present

## 2020-06-25 DIAGNOSIS — U071 COVID-19: Secondary | ICD-10-CM | POA: Diagnosis not present

## 2020-06-25 DIAGNOSIS — R627 Adult failure to thrive: Secondary | ICD-10-CM | POA: Diagnosis not present

## 2020-06-25 DIAGNOSIS — R1312 Dysphagia, oropharyngeal phase: Secondary | ICD-10-CM | POA: Diagnosis not present

## 2020-07-10 DIAGNOSIS — R1312 Dysphagia, oropharyngeal phase: Secondary | ICD-10-CM | POA: Diagnosis not present

## 2020-07-10 DIAGNOSIS — C44329 Squamous cell carcinoma of skin of other parts of face: Secondary | ICD-10-CM | POA: Diagnosis not present

## 2020-07-10 DIAGNOSIS — U071 COVID-19: Secondary | ICD-10-CM | POA: Diagnosis not present

## 2020-07-10 DIAGNOSIS — R627 Adult failure to thrive: Secondary | ICD-10-CM | POA: Diagnosis not present

## 2020-07-15 DIAGNOSIS — R1312 Dysphagia, oropharyngeal phase: Secondary | ICD-10-CM | POA: Diagnosis not present

## 2020-07-15 DIAGNOSIS — C44329 Squamous cell carcinoma of skin of other parts of face: Secondary | ICD-10-CM | POA: Diagnosis not present

## 2020-07-15 DIAGNOSIS — R627 Adult failure to thrive: Secondary | ICD-10-CM | POA: Diagnosis not present

## 2020-07-18 ENCOUNTER — Other Ambulatory Visit: Payer: Self-pay

## 2020-07-18 MED ORDER — SIMVASTATIN 20 MG PO TABS
20.0000 mg | ORAL_TABLET | Freq: Every day | ORAL | 3 refills | Status: DC
Start: 2020-07-18 — End: 2020-09-04

## 2020-07-22 ENCOUNTER — Ambulatory Visit (INDEPENDENT_AMBULATORY_CARE_PROVIDER_SITE_OTHER): Payer: Medicare HMO | Admitting: Family Medicine

## 2020-07-22 ENCOUNTER — Other Ambulatory Visit: Payer: Self-pay

## 2020-07-22 ENCOUNTER — Encounter: Payer: Self-pay | Admitting: Family Medicine

## 2020-07-22 ENCOUNTER — Ambulatory Visit
Admission: RE | Admit: 2020-07-22 | Discharge: 2020-07-22 | Disposition: A | Discharge status: Home / Self Care | Payer: Medicare HMO | Source: Ambulatory Visit | Attending: Family Medicine | Admitting: Family Medicine

## 2020-07-22 ENCOUNTER — Telehealth: Payer: Self-pay | Admitting: Family Medicine

## 2020-07-22 VITALS — BP 124/76 | HR 75 | Temp 98.5°F | Ht 70.0 in | Wt 158.0 lb

## 2020-07-22 DIAGNOSIS — I2510 Atherosclerotic heart disease of native coronary artery without angina pectoris: Secondary | ICD-10-CM

## 2020-07-22 DIAGNOSIS — J849 Interstitial pulmonary disease, unspecified: Secondary | ICD-10-CM

## 2020-07-22 DIAGNOSIS — R7989 Other specified abnormal findings of blood chemistry: Secondary | ICD-10-CM | POA: Diagnosis not present

## 2020-07-22 DIAGNOSIS — U071 COVID-19: Secondary | ICD-10-CM | POA: Diagnosis not present

## 2020-07-22 DIAGNOSIS — Q385 Congenital malformations of palate, not elsewhere classified: Secondary | ICD-10-CM

## 2020-07-22 DIAGNOSIS — J9601 Acute respiratory failure with hypoxia: Secondary | ICD-10-CM | POA: Diagnosis not present

## 2020-07-22 DIAGNOSIS — J1282 Pneumonia due to coronavirus disease 2019: Secondary | ICD-10-CM | POA: Diagnosis not present

## 2020-07-22 DIAGNOSIS — R5383 Other fatigue: Secondary | ICD-10-CM | POA: Diagnosis not present

## 2020-07-22 DIAGNOSIS — Z8616 Personal history of COVID-19: Secondary | ICD-10-CM | POA: Diagnosis not present

## 2020-07-22 DIAGNOSIS — E785 Hyperlipidemia, unspecified: Secondary | ICD-10-CM | POA: Diagnosis not present

## 2020-07-22 NOTE — Progress Notes (Signed)
Subjective:  Patient ID: [name] Fuelling., male    DOB: Jun 30, 1946  Age: 74 y.o. MRN: [id number]  CC:  Chief Complaint  Patient presents with  . Follow-up    on Acute hypoxemic respiratory failure due to COVID-19. Pt reports he is doing better now.   . [facility] [employer]    ntertitial Pulmonary disease ICD-10: [phone]  . Nutrition Counseling    Pt has some conserns about his nutrition due to his feeding tube, and would like to discuss with the provider. pt is having an issue wit hhis feeding tube supplies supplior.    HPI [name] DUFFY Sr. presents for   CAD status post CABG in 2005. As needed lasix for ankle swelling - none past few weeks.  Hx of sinus bradycardia.  Home readings: BP Readings from Last 3 Encounters:  07/22/20 124/76  05/25/20 125/70  05/18/20 132/74   Lab Results  Component Value Date   CREATININE 0.74 (L) 07/22/2020   Hyperlipidemia: Takes zocor 20mg  qd. Dr. Royann Shivers, appt 09/26/19.   Taking ASA 81mg  QD.   Lab Results  Component Value Date   CHOL 154 07/22/2020   HDL 55 07/22/2020   LDLCALC 78 07/22/2020   TRIG 115 07/22/2020   CHOLHDL 2.8 07/22/2020   Lab Results  Component Value Date   ALT 23 07/22/2020   AST 36 07/22/2020   ALKPHOS 121 07/22/2020   BILITOT 0.5 07/22/2020   Interstitial pulmonary disease: Admitted October 2 through October 9 with COVID-19 infection with acute hypoxic respiratory failure.  Treated with baricitinib, remdesivir, steroids and supplemental oxygen.  Chest x-ray in October indicated coarse interstitial opacities, chronic interstitial changes left greater than right.  Hospital follow-up via virtual visit October 25, improving at that time and had been off oxygen for 3 days.  Some nasal bleeding intermittently, thought to be possibly due to oxygen tubing.  Saline nasal spray discussed, anticipate improvement after cessation of oxygen.  RTC/ER precautions.  Some difficulty with fatigue, legs feel weak walking long distance, no  dyspnea. Large parkling lot hard to walk across. Legs feel better with stopping. Noticed since Covid infection.  No CP, no cough, no dyspnea.   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  Referred to ENT, visit with Dr. Hezzie Bump to review options for reconstructive surgery.  Visit in September.  Note reviewed, goal of the surgery would be to close the oronasal fistula, but upper lip forward and inferior to better mid to lower lip, and cosmetic improvement.  Possible need for several staged surgeries, and initial plan for 3D reconstruction with scan to evaluate for any signs of recurrence, evaluate neck vessels, and evaluate bone health.  He requires use of a feeding tube for nutrition, has had some difficulty getting coverage for the feeding tube.  Using feeding tube, but will need a letter documenting that he does or still require the use of feeding tube. Can take a few pills with water, but still has some difficulty with water - come out nose at times.  Plans on surgery with ENT as above to proceed with surgery.   History Patient Active Problem List   Diagnosis Date Noted  . Acute hypoxemic respiratory failure due to COVID-19 (HCC) 05/18/2020  . Hypotension 05/18/2020  . CAD s/p CABG 2005 07/04/2013  . Hyperlipidemia 07/04/2013  . HTN (hypertension) 07/04/2013  . Sinus bradycardia 07/04/2013   Past Medical History:  Diagnosis Date  . CAD  s/p CABG 2005 07/04/2013   2005 LIMA to LAD, free radial to ramus intermedius, sequential SVG to PDA and distal RCA Dr. Laneta Simmers  . HTN (hypertension) 07/04/2013  . Hyperlipidemia 07/04/2013   Past Surgical History:  Procedure Laterality Date  . CARDIAC CATHETERIZATION  01/15/2004  . CORONARY ARTERY BYPASS GRAFT  01/16/2004   CABG's x4  . HERNIA REPAIR N/A    Phreesia 01/27/2020  . NM MYOCAR PERF WALL MOTION  01/16/2010   EF 65%,Mets 10   Allergies  Allergen Reactions  . Aspartame  Hives  . Bupropion Anxiety  . Other Hives and Itching   Prior to Admission medications   Medication Sig Start Date End Date Taking? Authorizing Provider  aspirin EC 81 MG tablet Take 81 mg by mouth daily.    [provider]  furosemide (LASIX) 20 MG tablet Take 1 tablet (20 mg total) by mouth daily as needed for edema. 02/08/18 05/09/18  Croitoru, Mihai, MD  Omega 3 1200 MG CAPS Take by mouth. Patient not taking: Reported on 02/28/2020    [provider]  psyllium (METAMUCIL) 58.6 % packet Take 1 packet by mouth daily as needed (constipation).    [provider]  simvastatin (ZOCOR) 20 MG tablet Take 1 tablet (20 mg total) by mouth daily at 6 PM. 07/18/20   Croitoru, Rachelle Hora, MD   Social History   Socioeconomic History  . Marital status: Divorced    Spouse name: Not on file  . Number of children: Not on file  . Years of education: Not on file  . Highest education level: Not on file  Occupational History  . Not on file  Tobacco Use  . Smoking status: Former Smoker    Quit date: 08/16/2005    Years since quitting: 14.9  . Smokeless tobacco: Never Used  Substance and Sexual Activity  . Alcohol use: Yes    Comment: rarely  . Drug use: No  . Sexual activity: Not on file  Other Topics Concern  . Not on file  Social History Narrative  . Not on file   Social Determinants of Health   Financial Resource Strain:   . Difficulty of Paying Living Expenses: Not on file  Food Insecurity:   . Worried About Programme researcher, broadcasting/film/video in the Last Year: Not on file  . Ran Out of Food in the Last Year: Not on file  Transportation Needs:   . Lack of Transportation (Medical): Not on file  . Lack of Transportation (Non-Medical): Not on file  Physical Activity:   . Days of Exercise per Week: Not on file  . Minutes of Exercise per Session: Not on file  Stress:   . Feeling of Stress : Not on file  Social Connections:   . Frequency of Communication with Friends and Family: Not  on file  . Frequency of Social Gatherings with Friends and Family: Not on file  . Attends Religious Services: Not on file  . Active Member of Clubs or Organizations: Not on file  . Attends Banker Meetings: Not on file  . Marital Status: Not on file  Intimate Partner Violence:   . Fear of Current or Ex-Partner: Not on file  . Emotionally Abused: Not on file  . Physically Abused: Not on file  . Sexually Abused: Not on file    Review of Systems Per HPI  Objective:   Vitals:   07/22/20 1345  BP: 124/76  Pulse: 75  Temp: 98.5 F (36.9 C)  TempSrc: Temporal  SpO2: 94%  Weight: 158 lb (71.7 kg)  Height: 5\' 10"  (1.778 m)     Physical Exam Vitals reviewed.  Constitutional:      Appearance: He is well-developed.  Eyes:     Pupils: Pupils are equal, round, and reactive to light.  Neck:     Vascular: No carotid bruit or JVD.  Cardiovascular:     Rate and Rhythm: Normal rate and regular rhythm.     Heart sounds: Normal heart sounds. No murmur heard.   Pulmonary:     Effort: Pulmonary effort is normal.     Breath sounds: Normal breath sounds. No rales.  Musculoskeletal:     Right lower leg: No edema.     Left lower leg: No edema.     Comments: Intact and equal lower extremity strength, calves nontender, negative Homans, no calf swelling.  Skin:    General: Skin is warm and dry.  Neurological:     General: No focal deficit present.     Mental Status: He is alert and oriented to person, place, and time.        Assessment & Plan:  [name] DONINI Sr. is a 74 y.o. male . Palate abnormality  -Plan follow-up with ENT regarding surgical correction.  Letter provided for continued use of feeding tube due to difficulty with prior surgery.  Consider updated swallow study, but depending on timing of surgery, ENT eval.  Acute hypoxemic respiratory failure due to COVID-19 [facility])  -Symptomatically improved.  Updated chest x-ray ordered.  Hyperlipidemia, unspecified  hyperlipidemia type - Plan: Lipid panel  Elevated LFTs - Plan: Comprehensive metabolic panel  Coronary artery disease involving native heart without angina pectoris, unspecified vessel or lesion type - Plan: Ambulatory referral to Cardiology Fatigue, unspecified type - Plan: Ambulatory referral to Cardiology, CBC, TSH  -Fatigue in legs, and generalized fatigue with exertion.  Previous Covid infection.  History of CAD.  Differential includes peripheral vascular disease/PAD, or potentially deconditioning post Covid.  Referred to cardiology to discuss further and decide on further testing.  ER precautions given.  Check CBC, TSH.  Handicap placard provided.  Interstitial pulmonary disease (HCC) - Plan: DG Chest 2 View History of COVID-19 - Plan: CBC  -Improved breathing, repeat chest x-ray, previously thought to have component of chronic interstitial changes versus secondary to COVID-19.  No orders of the defined types were placed in this encounter.  Patient Instructions     I will refer you to your cardioloist to discuss fatigue in legs/body with walking, as well as check some labs today. I will complete a handicap placard for now.   Please go to [facility] imaging for a chest x-ray.  I will let you know if there are any concerns.  [facility]  [address] [place], [place], [place] [address]. Phone: [phone].  Keep follow-up with ear nose and throat as planned.  Let me know if further information needed besides a letter from today for your feeding tube, but would also recommend updated swallowing study. This can be discussed with ear nose and throat specialist depending on timing of your surgery.   If you have lab work done today you will be contacted with your lab results within the next 2 weeks.  If you have not heard from [place] then please contact us. The fastest way to get your results is to register for My Chart.   IF you received an x-ray today, you will receive an invoice from  [facility]. Please  contact [facility] Radiology at [phone] with questions or concerns regarding your invoice.   IF you received labwork today, you will receive an invoice from [place]. Please contact [facility] at [phone] with questions or concerns regarding your invoice.   Our billing staff will not be able to assist you with questions regarding bills from these companies.  You will be contacted with the lab results as soon as they are available. The fastest way to get your results is to activate your My Chart account. Instructions are located on the last page of this paperwork. If you have not heard from [place] regarding the results in 2 weeks, please contact this office.         Signed, Meredith Staggers, MD [facility]

## 2020-07-22 NOTE — Telephone Encounter (Addendum)
07/22/2020 - PATIENT SAW DR. Carlota Raspberry ON Monday (07/22/2020). DR. Carlota Raspberry HAS REQUESTED HE RETURN IN 2 MONTHS FOR A FOLLOW-UP VISIT WHICH HAS BEEN SCHEDULED. MR. Shirk ALREADY HAD A PHYSICAL SCHEDULED WITH DR. Carlota Raspberry ON Friday (08/30/2020), BUT I TOLD HIM IT WAS WITH JULIE AND WE WOULD CALL HIM. I NEED TO LET HIM KNOW IT IS WITH DR. Carlota Raspberry AND THAT HE NEEDS TO COME INTO THE OFFICE FOR THIS. Remy

## 2020-07-22 NOTE — Patient Instructions (Addendum)
   I will refer you to your cardioloist to discuss fatigue in legs/body with walking, as well as check some labs today. I will complete a handicap placard for now.   Please go to [facility] imaging for a chest x-ray.  I will let you know if there are any concerns.  [facility]  [place], [place], [place] [address]. Phone: [phone].  Keep follow-up with ear nose and throat as planned.  Let me know if further information needed besides a letter from today for your feeding tube, but would also recommend updated swallowing study. This can be discussed with ear nose and throat specialist depending on timing of your surgery.   If you have lab work done today you will be contacted with your lab results within the next 2 weeks.  If you have not heard from [place] then please contact us. The fastest way to get your results is to register for My Chart.   IF you received an x-ray today, you will receive an invoice from [facility] Radiology. Please contact [facility] Radiology at [phone] with questions or concerns regarding your invoice.   IF you received labwork today, you will receive an invoice from [place]. Please contact [facility] at [phone] with questions or concerns regarding your invoice.   Our billing staff will not be able to assist you with questions regarding bills from these companies.  You will be contacted with the lab results as soon as they are available. The fastest way to get your results is to activate your My Chart account. Instructions are located on the last page of this paperwork. If you have not heard from [place] regarding the results in 2 weeks, please contact this office.

## 2020-07-23 ENCOUNTER — Encounter: Payer: Self-pay | Admitting: Family Medicine

## 2020-07-23 LAB — CBC
Hematocrit: 43.7 % (ref 37.5–51.0)
Hemoglobin: 13.6 g/dL (ref 13.0–17.7)
MCH: 27.6 pg (ref 26.6–33.0)
MCHC: 31.1 g/dL — ABNORMAL LOW (ref 31.5–35.7)
MCV: 89 fL (ref 79–97)
Platelets: 184 10*3/uL (ref 150–450)
RBC: 4.92 x10E6/uL (ref 4.14–5.80)
RDW: 14.5 % (ref 11.6–15.4)
WBC: 4.9 10*3/uL (ref 3.4–10.8)

## 2020-07-23 LAB — COMPREHENSIVE METABOLIC PANEL
ALT: 23 IU/L (ref 0–44)
AST: 36 IU/L (ref 0–40)
Albumin/Globulin Ratio: 1.5 (ref 1.2–2.2)
Albumin: 4.4 g/dL (ref 3.7–4.7)
Alkaline Phosphatase: 121 IU/L (ref 44–121)
BUN/Creatinine Ratio: 35 — ABNORMAL HIGH (ref 10–24)
BUN: 26 mg/dL (ref 8–27)
Bilirubin Total: 0.5 mg/dL (ref 0.0–1.2)
CO2: 25 mmol/L (ref 20–29)
Calcium: 9.6 mg/dL (ref 8.6–10.2)
Chloride: 100 mmol/L (ref 96–106)
Creatinine, Ser: 0.74 mg/dL — ABNORMAL LOW (ref 0.76–1.27)
GFR calc Af Amer: 107 mL/min/{1.73_m2} (ref >59)
GFR calc non Af Amer: 92 mL/min/{1.73_m2} (ref >59)
Globulin, Total: 3 g/dL (ref 1.5–4.5)
Glucose: 99 mg/dL (ref 65–99)
Potassium: 4.2 mmol/L (ref 3.5–5.2)
Sodium: 141 mmol/L (ref 134–144)
Total Protein: 7.4 g/dL (ref 6.0–8.5)

## 2020-07-23 LAB — TSH: TSH: 2.95 u[IU]/mL (ref 0.450–4.500)

## 2020-07-23 LAB — LIPID PANEL
Chol/HDL Ratio: 2.8 ratio (ref 0.0–5.0)
Cholesterol, Total: 154 mg/dL (ref 100–199)
HDL: 55 mg/dL (ref >39)
LDL Chol Calc (NIH): 78 mg/dL (ref 0–99)
Triglycerides: 115 mg/dL (ref 0–149)
VLDL Cholesterol Cal: 21 mg/dL (ref 5–40)

## 2020-07-25 DIAGNOSIS — U071 COVID-19: Secondary | ICD-10-CM | POA: Diagnosis not present

## 2020-07-25 DIAGNOSIS — R1312 Dysphagia, oropharyngeal phase: Secondary | ICD-10-CM | POA: Diagnosis not present

## 2020-07-25 DIAGNOSIS — C44329 Squamous cell carcinoma of skin of other parts of face: Secondary | ICD-10-CM | POA: Diagnosis not present

## 2020-07-25 DIAGNOSIS — R627 Adult failure to thrive: Secondary | ICD-10-CM | POA: Diagnosis not present

## 2020-08-08 DIAGNOSIS — R627 Adult failure to thrive: Secondary | ICD-10-CM | POA: Diagnosis not present

## 2020-08-08 DIAGNOSIS — R1312 Dysphagia, oropharyngeal phase: Secondary | ICD-10-CM | POA: Diagnosis not present

## 2020-08-08 DIAGNOSIS — C44329 Squamous cell carcinoma of skin of other parts of face: Secondary | ICD-10-CM | POA: Diagnosis not present

## 2020-08-09 DIAGNOSIS — R627 Adult failure to thrive: Secondary | ICD-10-CM | POA: Diagnosis not present

## 2020-08-09 DIAGNOSIS — R1312 Dysphagia, oropharyngeal phase: Secondary | ICD-10-CM | POA: Diagnosis not present

## 2020-08-09 DIAGNOSIS — C44329 Squamous cell carcinoma of skin of other parts of face: Secondary | ICD-10-CM | POA: Diagnosis not present

## 2020-08-09 DIAGNOSIS — U071 COVID-19: Secondary | ICD-10-CM | POA: Diagnosis not present

## 2020-08-20 DIAGNOSIS — Q359 Cleft palate, unspecified: Secondary | ICD-10-CM | POA: Diagnosis not present

## 2020-08-20 DIAGNOSIS — I2510 Atherosclerotic heart disease of native coronary artery without angina pectoris: Secondary | ICD-10-CM | POA: Diagnosis not present

## 2020-08-20 DIAGNOSIS — Z9889 Other specified postprocedural states: Secondary | ICD-10-CM | POA: Diagnosis not present

## 2020-08-20 DIAGNOSIS — Z87891 Personal history of nicotine dependence: Secondary | ICD-10-CM | POA: Diagnosis not present

## 2020-08-20 DIAGNOSIS — Z8589 Personal history of malignant neoplasm of other organs and systems: Secondary | ICD-10-CM | POA: Diagnosis not present

## 2020-08-20 DIAGNOSIS — Z923 Personal history of irradiation: Secondary | ICD-10-CM | POA: Diagnosis not present

## 2020-08-20 DIAGNOSIS — C050 Malignant neoplasm of hard palate: Secondary | ICD-10-CM | POA: Diagnosis not present

## 2020-08-25 DIAGNOSIS — C44329 Squamous cell carcinoma of skin of other parts of face: Secondary | ICD-10-CM | POA: Diagnosis not present

## 2020-08-25 DIAGNOSIS — R1312 Dysphagia, oropharyngeal phase: Secondary | ICD-10-CM | POA: Diagnosis not present

## 2020-08-25 DIAGNOSIS — R627 Adult failure to thrive: Secondary | ICD-10-CM | POA: Diagnosis not present

## 2020-08-25 DIAGNOSIS — U071 COVID-19: Secondary | ICD-10-CM | POA: Diagnosis not present

## 2020-08-30 ENCOUNTER — Encounter: Payer: Medicare HMO | Admitting: Family Medicine

## 2020-09-04 ENCOUNTER — Encounter: Payer: Self-pay | Admitting: Cardiovascular Disease

## 2020-09-04 ENCOUNTER — Other Ambulatory Visit: Payer: Self-pay

## 2020-09-04 ENCOUNTER — Ambulatory Visit: Payer: Medicare HMO | Admitting: Cardiovascular Disease

## 2020-09-04 VITALS — BP 128/66 | HR 71 | Ht 70.0 in | Wt 159.4 lb

## 2020-09-04 DIAGNOSIS — E7800 Pure hypercholesterolemia, unspecified: Secondary | ICD-10-CM | POA: Diagnosis not present

## 2020-09-04 DIAGNOSIS — I25810 Atherosclerosis of coronary artery bypass graft(s) without angina pectoris: Secondary | ICD-10-CM

## 2020-09-04 MED ORDER — SIMVASTATIN 20 MG PO TABS
20.0000 mg | ORAL_TABLET | Freq: Every day | ORAL | 3 refills | Status: DC
Start: 2020-09-04 — End: 2021-07-28

## 2020-09-04 NOTE — Progress Notes (Signed)
Patient ID: [name]., male   DOB: Feb 05, 1946, 75 y.o.   MRN: [id number]    Cardiology Office Note    Date:  09/04/2020   ID:  [name] Center Sr., DOB [birth year], MRN [id number]  PCP:  Shade Flood, MD  Cardiologist:   Thurmon Fair, MD   No chief complaint on file.   History of Present Illness:  [name]. is a 75 y.o. male who underwent coronary bypass surgery for multivessel CAD in 2005. He has treated hyperlipidemia and hypertension.   He has not had any cardiovascular problems in the last year, but was hospitalized with COVID-19 pneumonia and hypoxia in September.  He had received only 1 dose of the vaccine.  He was in the intensive care unit for a couple of days and spent about a week in the hospital, followed by another week on oxygen at home.  He had some problems with "COVID fog" for a while, but all his symptoms have subsequently resolved.  She did not have any angina during this time (for him angina and has generally been left arm pain).  The patient specifically denies any chest pain at rest exertion, dyspnea at rest or with exertion, orthopnea, paroxysmal nocturnal dyspnea, syncope, palpitations, focal neurological deficits, intermittent claudication, lower extremity edema, unexplained weight gain, cough, hemoptysis or wheezing.  He has taken the "as needed" furosemide once every 3 to 4 months.  He is contemplating facial reconstructive surgery this spring, if COVID numbers allow.  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 His teeth have been extracted from the upper maxillary. This prevents him from eating and drinking and he is still getting nutrition through a gastrostomy tube.   He is taking aspirin and statin, but  is not on beta-blockers since he had marked sinus bradycardia in the 40s in the past.  Today his heart rate is 73.Marland Kitchen   He has not had problems with coronary symptoms since his bypass surgery.  He has a "false positive" ECG treadmill response, but had a normal nuclear stress test in 2011. In 2016, his exercise capacity was identical to 2011 (8 minutes on the Bruce protocol).  He has long-standing problems with erectile dysfunction.   Past Medical History:  Diagnosis Date  . 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 Laneta Simmers  . HTN (hypertension) 07/04/2013  . Hyperlipidemia 07/04/2013    Past Surgical History:  Procedure Laterality Date  . CARDIAC CATHETERIZATION  01/15/2004  . CORONARY ARTERY BYPASS GRAFT  01/16/2004   CABG's x4  . HERNIA REPAIR N/A    Phreesia 01/27/2020  . NM MYOCAR PERF WALL MOTION  01/16/2010   EF 65%,Mets 10    Current Medications: Outpatient Medications Prior to Visit  Medication Sig Dispense Refill  . aspirin EC 81 MG tablet Take 81 mg by mouth daily.    . furosemide (LASIX) 20 MG tablet Take 20 mg by mouth as needed. For swelling    . simvastatin (ZOCOR) 20 MG tablet Take 1 tablet (20 mg total) by mouth daily at 6 PM. 90 tablet 3  . furosemide (LASIX) 20 MG tablet Take 1  tablet (20 mg total) by mouth daily as needed for edema. 45 tablet 3  . Omega 3 1200 MG CAPS Take by mouth. (Patient not taking: Reported on 02/28/2020)    . psyllium (METAMUCIL) 58.6 % packet Take 1 packet by mouth daily as needed (constipation).     No facility-administered medications prior to visit.     Allergies:   Aspartame, Bupropion, and Other   Social History   Socioeconomic History  . Marital status: Divorced    Spouse name: Not on file  . Number of children: Not on file  . Years of education: Not on file  . Highest education level: Not on file  Occupational History  . Not on file  Tobacco Use   . Smoking status: Former Smoker    Quit date: 08/16/2005    Years since quitting: 15.0  . Smokeless tobacco: Never Used  Substance and Sexual Activity  . Alcohol use: Yes    Comment: rarely  . Drug use: No  . Sexual activity: Not on file  Other Topics Concern  . Not on file  Social History Narrative  . Not on file   Social Determinants of Health   Financial Resource Strain: Not on file  Food Insecurity: Not on file  Transportation Needs: Not on file  Physical Activity: Not on file  Stress: Not on file  Social Connections: Not on file    ROS:   Please see the history of present illness.    ROS All other systems are reviewed and are negative.   PHYSICAL EXAM:   VS:  BP 128/66   Pulse 71   Ht 5\' 10"  (1.778 m)   Wt 159 lb 6.4 oz (72.3 kg)   BMI 22.87 kg/m     General: Alert, oriented x3, no distress, he is lean but not cachectic or malnourished Head: He has an orifice connecting his right nostril area to his nasal and oral cavity with a large defect in the hard palate, PERRL, EOMI, no exophtalmos or lid lag, no myxedema, no xanthelasma; normal ears, nose and oropharynx Neck: normal jugular venous pulsations and no hepatojugular reflux; brisk carotid pulses without delay and no carotid bruits Chest: clear to auscultation, no signs of consolidation by percussion or palpation, normal fremitus, symmetrical and full respiratory excursions Cardiovascular: normal position and quality of the apical impulse, regular rhythm, normal first and second heart sounds, no murmurs, rubs or gallops Abdomen: no tenderness or distention, no masses by palpation, no abnormal pulsatility or arterial bruits, normal bowel sounds, no hepatosplenomegaly Extremities: no clubbing, cyanosis or edema; 2+ radial, ulnar and brachial pulses bilaterally; 2+ right femoral, posterior tibial and dorsalis pedis pulses; 2+ left femoral, posterior tibial and dorsalis pedis pulses; no subclavian or femoral  bruits Neurological: grossly nonfocal Psych: Normal mood and affect   Wt Readings from Last 3 Encounters:  09/04/20 159 lb 6.4 oz (72.3 kg)  07/22/20 158 lb (71.7 kg)  05/25/20 163 lb 5.8 oz (74.1 kg)      Studies/Labs Reviewed:   EKG:  EKG is ordered today.  It shows sinus rhythm and possible left atrial abnormality, but is otherwise a normal tracing.  Recent Labs: 05/25/2020: Magnesium 2.6 07/22/2020: ALT 23; BUN 26; Creatinine, Ser 0.74; Hemoglobin 13.6; Platelets 184; Potassium 4.2; Sodium 141; TSH 2.950   Lipid Panel    Component Value Date/Time   CHOL 154 07/22/2020 1639   TRIG 115 07/22/2020 1639   HDL 55 07/22/2020 1639   CHOLHDL 2.8 07/22/2020 1639  CHOLHDL 3.0 10/18/2014 1033   VLDL 16 10/18/2014 1033   LDLCALC 78 07/22/2020 1639     ASSESSMENT:    1. Coronary artery disease involving coronary bypass graft of native heart without angina pectoris   2. Hypercholesterolemia      PLAN:  In order of problems listed above:  1. CAD: Asymptomatic despite being physically active.  On aspirin and statin, but not on beta-blocker due to history of bradycardia.  I believe he is at low risk for major cardiovascular complications with facial reconstructive surgery. 2. HLP: LDL is close to target and HDL is excellent on current statin therapy.  Hemoglobin A1c in prediabetes range, despite the fact that he is very lean.  I wonder whether this may have to do with the fact that he has to be on a liquid diet, eating simple rather than complex carbohydrates. 3. Sinus bradycardia: No longer a problem, but I still think it is wise to avoid any medications with negative chronotropic effect..   Medication Adjustments/Labs and Tests Ordered: Current medicines are reviewed at length with the patient today.  Concerns regarding medicines are outlined above.  Medication changes, Labs and Tests ordered today are listed in the Patient Instructions below. Patient Instructions  Medication  Instructions:  No changes *If you need a refill on your cardiac medications before your next appointment, please call your pharmacy*   Lab Work: None ordered If you have labs (blood work) drawn today and your tests are completely normal, you will receive your results only by: Marland Kitchen MyChart Message (if you have MyChart) OR . A paper copy in the mail If you have any lab test that is abnormal or we need to change your treatment, we will call you to review the results.   Testing/Procedures: None ordered   Follow-Up: At [facility], you and your health needs are our priority.  As part of our continuing mission to provide you with exceptional heart care, we have created designated Provider Care Teams.  These Care Teams include your primary Cardiologist (physician) and Advanced Practice Providers (APPs -  Physician Assistants and Nurse Practitioners) who all work together to provide you with the care you need, when you need it.  We recommend signing up for the patient portal called "MyChart".  Sign up information is provided on this After Visit Summary.  MyChart is used to connect with patients for Virtual Visits (Telemedicine).  Patients are able to view lab/test results, encounter notes, upcoming appointments, etc.  Non-urgent messages can be sent to your provider as well.   To learn more about what you can do with MyChart, go to [email or web address].    Your next appointment:   12 month(s)  The format for your next appointment:   In Person  Provider:   You may see Thurmon Fair, MD or one of the following Advanced Practice Providers on your designated Care Team:    Azalee Course, PA-C  Micah Flesher, [place] or   Judy Pimple, PA-C       Signed, Thurmon Fair, MD  09/04/2020 2:15 PM    [facility] [address] [place], [place], [place]  [address] Phone: [phone]; Fax: [phone]

## 2020-09-04 NOTE — Patient Instructions (Signed)

## 2020-09-10 DIAGNOSIS — R1312 Dysphagia, oropharyngeal phase: Secondary | ICD-10-CM | POA: Diagnosis not present

## 2020-09-10 DIAGNOSIS — C44329 Squamous cell carcinoma of skin of other parts of face: Secondary | ICD-10-CM | POA: Diagnosis not present

## 2020-09-10 DIAGNOSIS — R627 Adult failure to thrive: Secondary | ICD-10-CM | POA: Diagnosis not present

## 2020-09-23 ENCOUNTER — Encounter: Payer: Self-pay | Admitting: Family Medicine

## 2020-09-23 ENCOUNTER — Ambulatory Visit (INDEPENDENT_AMBULATORY_CARE_PROVIDER_SITE_OTHER): Payer: Medicare HMO | Admitting: Family Medicine

## 2020-09-23 ENCOUNTER — Other Ambulatory Visit: Payer: Self-pay

## 2020-09-23 VITALS — BP 135/81 | HR 69 | Temp 98.7°F | Ht 70.0 in | Wt 161.0 lb

## 2020-09-23 DIAGNOSIS — Q385 Congenital malformations of palate, not elsewhere classified: Secondary | ICD-10-CM | POA: Diagnosis not present

## 2020-09-23 DIAGNOSIS — R5383 Other fatigue: Secondary | ICD-10-CM

## 2020-09-23 DIAGNOSIS — I2510 Atherosclerotic heart disease of native coronary artery without angina pectoris: Secondary | ICD-10-CM

## 2020-09-23 NOTE — Patient Instructions (Addendum)
Glad to hear that you are doing better.  If your surgeon needs anything from me, let me know  Let me in June and we can recheck some lab work.  Good luck and take care!     If you have lab work done today you will be contacted with your lab results within the next 2 weeks.  If you have not heard from [place] then please contact us. The fastest way to get your results is to register for My Chart.   IF you received an x-ray today, you will receive an invoice from [facility]. Please contact [facility]iology at [phone] with questions or concerns regarding your invoice.   IF you received labwork today, you will receive an invoice from [place]. Please contact [facility] at [phone] with questions or concerns regarding your invoice.   Our billing staff will not be able to assist you with questions regarding bills from these companies.  You will be contacted with the lab results as soon as they are available. The fastest way to get your results is to activate your My Chart account. Instructions are located on the last page of this paperwork. If you have not heard from [place] regarding the results in 2 weeks, please contact this office.

## 2020-09-23 NOTE — Progress Notes (Signed)
Subjective:  Patient ID: [name]., male    DOB: 16-Jun-1946  Age: 75 y.o. MRN: [id number]  CC:  Chief Complaint  Patient presents with  . Follow-up    On Palate abnormality,hyperlipidemia, and fatigue. Pt reports he saw the cardiologist since last OV and was told every thing looked good there. Pt reports fatigue has gotten better since last OV. Pt also reports seeing a surgeon since last OV.    HPI [name] TINDER Sr. presents for   Fatigue Discussed in December, referred to cardiology given his history of CAD.  Differential includes PAD or possible deconditioning post Covid infection.  CBC, TSH were reassuring.  He was evaluated January 19 by cardiology, thought to be at low risk for major cardiovascular complication for facial reconstructive surgery.  Previous sinus bradycardia but no recent issues, and recommended avoiding medications with negative chronotropic effect.  Previous "COVID fog" had improved.  Chest x-ray December 6 with chronic findings of emphysema, pulmonary scarring.  Close to baseline study of 2020.  Slight progression of scarring in bases possibly.  He was admitted in October 2021 with COVID-19 infection with acute hypoxic respiratory failure.  Thought to have some possible chronic interstitial changes versus secondary to COVID-19 on previous imaging.  Feeling better, no further fatigue. No DOE. No cough. No chest pain.  Previous elevated LFT's resolved 07/22/20.    Palate abnormality Followed by ENT, Dr. Hezzie Bump at [facility], (Dr. Pollyann Kennedy locally).,  regarding surgical correction of palate abnormality, oronasal fistula. Appointment January 4 with Dr. Hezzie Bump.  Plan for surgical correction, possible several staged surgeries.  Plan for setting up surgeries. Possibility of delay with current pandemic.     History Patient Active Problem List   Diagnosis Date Noted  . Acute hypoxemic respiratory failure due to COVID-19 (HCC) 05/18/2020  . Hypotension  05/18/2020  . CAD s/p CABG 2005 07/04/2013  . Hyperlipidemia 07/04/2013  . HTN (hypertension) 07/04/2013  . Sinus bradycardia 07/04/2013   Past Medical History:  Diagnosis Date  . 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 Laneta Simmers  . HTN (hypertension) 07/04/2013  . Hyperlipidemia 07/04/2013   Past Surgical History:  Procedure Laterality Date  . CARDIAC CATHETERIZATION  01/15/2004  . CORONARY ARTERY BYPASS GRAFT  01/16/2004   CABG's x4  . HERNIA REPAIR N/A    Phreesia 01/27/2020  . NM MYOCAR PERF WALL MOTION  01/16/2010   EF 65%,Mets 10   Allergies  Allergen Reactions  . Aspartame Hives  . Bupropion Anxiety  . Other Hives and Itching   Prior to Admission medications   Medication Sig Start Date End Date Taking? Authorizing Provider  aspirin EC 81 MG tablet Take 81 mg by mouth daily.   Yes [provider]  furosemide (LASIX) 20 MG tablet Take 20 mg by mouth as needed. For swelling   Yes [provider]  simvastatin (ZOCOR) 20 MG tablet Take 1 tablet (20 mg total) by mouth daily at 6 PM. 09/04/20  Yes Croitoru, Rachelle Hora, MD   Social History   Socioeconomic History  . Marital status: Divorced    Spouse name: Not on file  . Number of children: Not on file  . Years of education: Not on file  . Highest education level: Not on file  Occupational History  . Not on file  Tobacco Use  . Smoking status: Former Smoker  Quit date: 08/16/2005    Years since quitting: 15.1  . Smokeless tobacco: Never Used  Substance and Sexual Activity  . Alcohol use: Yes    Comment: rarely  . Drug use: No  . Sexual activity: Not on file  Other Topics Concern  . Not on file  Social History Narrative  . Not on file   Social Determinants of Health   Financial Resource Strain: Not on file  Food Insecurity: Not on file  Transportation Needs: Not on file  Physical Activity: Not on file  Stress: Not  on file  Social Connections: Not on file  Intimate Partner Violence: Not on file    Review of Systems  Respiratory: Negative for cough and shortness of breath.   Cardiovascular: Negative for chest pain, palpitations and leg swelling.   Objective:   Vitals:   09/23/20 1401  BP: 135/81  Pulse: 69  Temp: 98.7 F (37.1 C)  TempSrc: Temporal  SpO2: 97%  Weight: 161 lb (73 kg)  Height: 5\' 10"  (1.778 m)     Physical Exam Vitals reviewed.  Constitutional:      Appearance: He is well-developed and well-nourished.  HENT:     Head: Normocephalic and atraumatic.  Eyes:     Extraocular Movements: EOM normal.     Pupils: Pupils are equal, round, and reactive to light.  Neck:     Vascular: No carotid bruit or JVD.  Cardiovascular:     Rate and Rhythm: Normal rate and regular rhythm.     Heart sounds: Normal heart sounds. No murmur heard.   Pulmonary:     Effort: Pulmonary effort is normal.     Breath sounds: Normal breath sounds. No rales.  Musculoskeletal:        General: No edema.  Skin:    General: Skin is warm and dry.  Neurological:     Mental Status: He is alert and oriented to person, place, and time.  Psychiatric:        Mood and Affect: Mood and affect normal.     Assessment & Plan:  [name] SAMONS Sr. is a 75 y.o. male . Palate abnormality  Coronary artery disease involving native heart without angina pectoris, unspecified vessel or lesion type  Fatigue, unspecified type  Fatigue has improved.  Denies recent symptoms, denies cough, dyspnea.  Recent cardiology eval as above without specific restrictions for surgery.  Plan follow-up with ENT at [facility] for surgical correction of palate abnormality as above.  Follow-up with me in 4 months for repeat labs.  No orders of the defined types were placed in this encounter.  Patient Instructions   Glad to hear that you are doing better.  If your surgeon needs anything from me, let me know  Let me in  June and we can recheck some lab work.  Good luck and take care!     If you have lab work done today you will be contacted with your lab results within the next 2 weeks.  If you have not heard from [place] then please contact us. The fastest way to get your results is to register for My Chart.   IF you received an x-ray today, you will receive an invoice from [facility]. Please contact [facility] at [phone] with questions or concerns regarding your invoice.   IF you received labwork today, you will receive an invoice from [place]. Please contact [facility] at [phone] with questions or concerns regarding your invoice.   Our  billing staff will not be able to assist you with questions regarding bills from these companies.  You will be contacted with the lab results as soon as they are available. The fastest way to get your results is to activate your My Chart account. Instructions are located on the last page of this paperwork. If you have not heard from [place] regarding the results in 2 weeks, please contact this office.         Signed, Meredith Staggers, MD [facility]

## 2020-10-10 DIAGNOSIS — I2510 Atherosclerotic heart disease of native coronary artery without angina pectoris: Secondary | ICD-10-CM | POA: Diagnosis not present

## 2020-10-10 DIAGNOSIS — Z923 Personal history of irradiation: Secondary | ICD-10-CM | POA: Diagnosis not present

## 2020-10-10 DIAGNOSIS — R627 Adult failure to thrive: Secondary | ICD-10-CM | POA: Diagnosis not present

## 2020-10-10 DIAGNOSIS — K9423 Gastrostomy malfunction: Secondary | ICD-10-CM | POA: Diagnosis not present

## 2020-10-10 DIAGNOSIS — K219 Gastro-esophageal reflux disease without esophagitis: Secondary | ICD-10-CM | POA: Diagnosis not present

## 2020-10-10 DIAGNOSIS — R1312 Dysphagia, oropharyngeal phase: Secondary | ICD-10-CM | POA: Diagnosis not present

## 2020-10-10 DIAGNOSIS — C44329 Squamous cell carcinoma of skin of other parts of face: Secondary | ICD-10-CM | POA: Diagnosis not present

## 2020-10-10 DIAGNOSIS — Z87891 Personal history of nicotine dependence: Secondary | ICD-10-CM | POA: Diagnosis not present

## 2020-10-10 DIAGNOSIS — Z91018 Allergy to other foods: Secondary | ICD-10-CM | POA: Diagnosis not present

## 2020-10-10 DIAGNOSIS — Z85828 Personal history of other malignant neoplasm of skin: Secondary | ICD-10-CM | POA: Diagnosis not present

## 2020-10-10 DIAGNOSIS — Z951 Presence of aortocoronary bypass graft: Secondary | ICD-10-CM | POA: Diagnosis not present

## 2020-10-10 DIAGNOSIS — I10 Essential (primary) hypertension: Secondary | ICD-10-CM | POA: Diagnosis not present

## 2020-10-12 DIAGNOSIS — I10 Essential (primary) hypertension: Secondary | ICD-10-CM | POA: Diagnosis not present

## 2020-10-12 DIAGNOSIS — K9423 Gastrostomy malfunction: Secondary | ICD-10-CM | POA: Diagnosis not present

## 2020-10-12 DIAGNOSIS — Z87891 Personal history of nicotine dependence: Secondary | ICD-10-CM | POA: Diagnosis not present

## 2020-10-12 DIAGNOSIS — L539 Erythematous condition, unspecified: Secondary | ICD-10-CM | POA: Diagnosis not present

## 2020-10-12 DIAGNOSIS — Z7982 Long term (current) use of aspirin: Secondary | ICD-10-CM | POA: Diagnosis not present

## 2020-10-12 DIAGNOSIS — K219 Gastro-esophageal reflux disease without esophagitis: Secondary | ICD-10-CM | POA: Diagnosis not present

## 2020-10-12 DIAGNOSIS — Z888 Allergy status to other drugs, medicaments and biological substances status: Secondary | ICD-10-CM | POA: Diagnosis not present

## 2020-10-12 DIAGNOSIS — Z79899 Other long term (current) drug therapy: Secondary | ICD-10-CM | POA: Diagnosis not present

## 2020-10-12 DIAGNOSIS — Z4682 Encounter for fitting and adjustment of non-vascular catheter: Secondary | ICD-10-CM | POA: Diagnosis not present

## 2020-10-12 DIAGNOSIS — I2510 Atherosclerotic heart disease of native coronary artery without angina pectoris: Secondary | ICD-10-CM | POA: Diagnosis not present

## 2020-10-24 ENCOUNTER — Telehealth: Payer: Self-pay | Admitting: Family Medicine

## 2020-10-24 NOTE — Telephone Encounter (Signed)
Thayer Headings is calling for her father-in -law. He is needing a referral to a GI specialist. He is having a hard time with his feeding tube, the food will not go In properly. I offered an appointment for him to get a referral, she wanted a phone message put in. Please advise at [phone].

## 2020-10-24 NOTE — Telephone Encounter (Signed)
If pt doesn't have a GI provider and needs a referral an appt would be needed to place the referral.

## 2020-10-24 NOTE — Telephone Encounter (Signed)
Appointment scheduled for Monday, 10/28/2020.

## 2020-10-28 ENCOUNTER — Encounter: Payer: Self-pay | Admitting: Family Medicine

## 2020-10-28 ENCOUNTER — Other Ambulatory Visit: Payer: Self-pay

## 2020-10-28 ENCOUNTER — Ambulatory Visit (INDEPENDENT_AMBULATORY_CARE_PROVIDER_SITE_OTHER): Payer: Medicare HMO | Admitting: Family Medicine

## 2020-10-28 VITALS — BP 136/77 | HR 67 | Temp 98.4°F | Resp 15 | Ht 70.0 in | Wt 160.8 lb

## 2020-10-28 DIAGNOSIS — K9423 Gastrostomy malfunction: Secondary | ICD-10-CM | POA: Diagnosis not present

## 2020-10-28 NOTE — Patient Instructions (Addendum)
I have entered a referral for gastroenterology - they should be calling you soon.  Return to the clinic or go to the nearest emergency room if any of your symptoms worsen or new symptoms occur.   [facility] Address: [address] [place], [place], [place] [address] Phone: [phone]   If you have lab work done today you will be contacted with your lab results within the next 2 weeks.  If you have not heard from [place] then please contact us. The fastest way to get your results is to register for My Chart.   IF you received an x-ray today, you will receive an invoice from [facility]. Please contact [facility] at [phone] with questions or concerns regarding your invoice.   IF you received labwork today, you will receive an invoice from [place]. Please contact [facility] at [phone] with questions or concerns regarding your invoice.   Our billing staff will not be able to assist you with questions regarding bills from these companies.  You will be contacted with the lab results as soon as they are available. The fastest way to get your results is to activate your My Chart account. Instructions are located on the last page of this paperwork. If you have not heard from [place] regarding the results in 2 weeks, please contact this office.

## 2020-10-28 NOTE — Progress Notes (Signed)
Subjective:  Patient ID: [name]., male    DOB: 09-05-1945  Age: 75 y.o. MRN: [id number]  CC:  Chief Complaint  Patient presents with  . Referral    Pt here needs referral to see GI due to leak in PEG tube, needs evaluation by endoscopy.     HPI [name] presents for   PEG feeding tube difficulty: Planned surgery with Dr. 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  Has PEG feeding tube - fell out on 2/24 - went to [facility] ER. 18 french feeding tube easily inserted and balloon inflation without difficulty.  Went to use feeding tube next day and noticed liquid come out of the hole around the tube. Went to ER, G tube noted to be within the stomach. At ER noted PEG tube leaking - thought to have a small leak as it enter the stomach - GI follow up with endoscopy recommended.  Has been able to use feeding tube if using pump (runs slower), just not able to use syringe.  Does not have GI locally.  No new skin irritation or abd pain.      History Patient Active Problem List   Diagnosis Date Noted  . Acute hypoxemic respiratory failure due to COVID-19 (HCC) 05/18/2020  . Hypotension 05/18/2020  . CAD s/p CABG 2005 07/04/2013  . Hyperlipidemia 07/04/2013  . HTN (hypertension) 07/04/2013  . Sinus bradycardia 07/04/2013   Past Medical History:  Diagnosis Date  . 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 Laneta Simmers  . HTN (hypertension) 07/04/2013  . Hyperlipidemia 07/04/2013   Past Surgical History:  Procedure Laterality Date  . CARDIAC CATHETERIZATION  01/15/2004  . CORONARY ARTERY BYPASS GRAFT  01/16/2004   CABG's x4  . HERNIA REPAIR N/A    Phreesia 01/27/2020  . NM MYOCAR PERF WALL MOTION  01/16/2010   EF 65%,Mets 10   Allergies  Allergen Reactions  . Aspartame Hives  . Bupropion Anxiety  . Other Hives and Itching   Prior to  Admission medications   Medication Sig Start Date End Date Taking? Authorizing Provider  aspirin EC 81 MG tablet Take 81 mg by mouth daily.   Yes [provider]  esomeprazole (NEXIUM) 20 MG packet Take 20 mg by mouth. 2 tablets in the morning 1 tablet at night   Yes [provider]  furosemide (LASIX) 20 MG tablet Take 20 mg by mouth as needed. For swelling   Yes [provider]  simvastatin (ZOCOR) 20 MG tablet Take 1 tablet (20 mg total) by mouth daily at 6 PM. 09/04/20  Yes Croitoru, Rachelle Hora, MD   Social History   Socioeconomic History  . Marital status: Divorced    Spouse name: Not on file  . Number of children: Not on file  . Years of education: Not on file  . Highest education level: Not on file  Occupational History  . Not on file  Tobacco Use  . Smoking status: Former Smoker    Quit date: 08/16/2005    Years since quitting: 15.2  . Smokeless tobacco: Never Used  Substance and Sexual Activity  . Alcohol use: Yes    Comment: rarely  . Drug use: No  . Sexual activity: Not on file  Other Topics Concern  . Not on file  Social History Narrative  . Not on file  Social Determinants of Health   Financial Resource Strain: Not on file  Food Insecurity: Not on file  Transportation Needs: Not on file  Physical Activity: Not on file  Stress: Not on file  Social Connections: Not on file  Intimate Partner Violence: Not on file    Review of Systems Per HPI.   Objective:   Vitals:   10/28/20 1500  BP: 136/77  Pulse: 67  Resp: 15  Temp: 98.4 F (36.9 C)  TempSrc: Temporal  SpO2: 96%  Weight: 160 lb 12.8 oz (72.9 kg)  Height: 5\' 10"  (1.778 m)     Physical Exam Vitals reviewed.  Constitutional:      General: He is not in acute distress.    Appearance: He is well-developed.  HENT:     Head: Normocephalic and atraumatic.  Cardiovascular:     Rate and Rhythm: Normal rate.  Pulmonary:     Effort: Pulmonary effort is normal.  Abdominal:      General: Abdomen is flat.     Palpations: Abdomen is soft.     Tenderness: There is no abdominal tenderness.     Comments: PEG tube noted with surrounding skin without significant erythema, rash.  Dry bandage in place.  Neurological:     Mental Status: He is alert and oriented to person, place, and time.  Psychiatric:        Mood and Affect: Mood normal.        Behavior: Behavior normal.     Assessment & Plan:  [name] is a 75 y.o. male . Leaking PEG tube (HCC) - Plan: Ambulatory referral to Gastroenterology  -Leaking PEG tube, still able to function with slow feeds with pump.  Urgent referral placed to gastroenterology for evaluation as potentially may need to be evaluated via endoscopy as above.  RTC/ER precautions given  No orders of the defined types were placed in this encounter.  Patient Instructions     [facility] Address: [address] [place], [place], [place] [address] Phone: [phone]   If you have lab work done today you will be contacted with your lab results within the next 2 weeks.  If you have not heard from [place] then please contact us. The fastest way to get your results is to register for My Chart.   IF you received an x-ray today, you will receive an invoice from [facility]. Please contact [facility] at [phone] with questions or concerns regarding your invoice.   IF you received labwork today, you will receive an invoice from [facility]. Please contact [facility] at [phone] with questions or concerns regarding your invoice.   Our billing staff will not be able to assist you with questions regarding bills from these companies.  You will be contacted with the lab results as soon as they are available. The fastest way to get your results is to activate your My Chart account. Instructions are located on the last page of this paperwork. If you have not heard from [place] regarding the results in 2 weeks, please contact  this office.         Signed, Meredith Staggers, MD [facility]

## 2020-10-31 ENCOUNTER — Telehealth: Payer: Self-pay | Admitting: Emergency Medicine

## 2020-10-31 DIAGNOSIS — J3489 Other specified disorders of nose and nasal sinuses: Secondary | ICD-10-CM | POA: Diagnosis not present

## 2020-10-31 DIAGNOSIS — T8183XA Persistent postprocedural fistula, initial encounter: Secondary | ICD-10-CM | POA: Diagnosis not present

## 2020-10-31 DIAGNOSIS — E785 Hyperlipidemia, unspecified: Secondary | ICD-10-CM | POA: Diagnosis not present

## 2020-10-31 DIAGNOSIS — D62 Acute posthemorrhagic anemia: Secondary | ICD-10-CM | POA: Diagnosis not present

## 2020-10-31 DIAGNOSIS — Z8616 Personal history of COVID-19: Secondary | ICD-10-CM | POA: Diagnosis not present

## 2020-10-31 DIAGNOSIS — K219 Gastro-esophageal reflux disease without esophagitis: Secondary | ICD-10-CM | POA: Diagnosis not present

## 2020-10-31 DIAGNOSIS — D696 Thrombocytopenia, unspecified: Secondary | ICD-10-CM | POA: Diagnosis not present

## 2020-10-31 DIAGNOSIS — K9423 Gastrostomy malfunction: Secondary | ICD-10-CM

## 2020-10-31 DIAGNOSIS — I10 Essential (primary) hypertension: Secondary | ICD-10-CM | POA: Diagnosis not present

## 2020-10-31 DIAGNOSIS — I2510 Atherosclerotic heart disease of native coronary artery without angina pectoris: Secondary | ICD-10-CM | POA: Diagnosis not present

## 2020-10-31 NOTE — Telephone Encounter (Signed)
Dr Alan Mulder stated  "We do not provide services related to PEG tubes. Patient needs to be referred to intervention radiology."  Can you place a referral wherever they do intervention radiology. Patient stated his surgery is scheduled for 11/07/20 so he need this asap

## 2020-10-31 NOTE — Telephone Encounter (Signed)
[name]/[name] was informed about the referral to interventional Radiology

## 2020-10-31 NOTE — Telephone Encounter (Signed)
Noted.  Referral placed for interventional radiology but please advise patient of that referral and reason.

## 2020-11-07 DIAGNOSIS — R0689 Other abnormalities of breathing: Secondary | ICD-10-CM | POA: Diagnosis not present

## 2020-11-07 DIAGNOSIS — T8183XA Persistent postprocedural fistula, initial encounter: Secondary | ICD-10-CM | POA: Diagnosis not present

## 2020-11-07 DIAGNOSIS — Z923 Personal history of irradiation: Secondary | ICD-10-CM | POA: Diagnosis not present

## 2020-11-07 DIAGNOSIS — D696 Thrombocytopenia, unspecified: Secondary | ICD-10-CM | POA: Diagnosis not present

## 2020-11-07 DIAGNOSIS — J3489 Other specified disorders of nose and nasal sinuses: Secondary | ICD-10-CM | POA: Diagnosis not present

## 2020-11-07 DIAGNOSIS — K219 Gastro-esophageal reflux disease without esophagitis: Secondary | ICD-10-CM | POA: Diagnosis not present

## 2020-11-07 DIAGNOSIS — M278 Other specified diseases of jaws: Secondary | ICD-10-CM | POA: Diagnosis not present

## 2020-11-07 DIAGNOSIS — Q359 Cleft palate, unspecified: Secondary | ICD-10-CM | POA: Diagnosis not present

## 2020-11-07 DIAGNOSIS — E878 Other disorders of electrolyte and fluid balance, not elsewhere classified: Secondary | ICD-10-CM | POA: Diagnosis not present

## 2020-11-07 DIAGNOSIS — E46 Unspecified protein-calorie malnutrition: Secondary | ICD-10-CM | POA: Diagnosis not present

## 2020-11-07 DIAGNOSIS — I2510 Atherosclerotic heart disease of native coronary artery without angina pectoris: Secondary | ICD-10-CM | POA: Diagnosis not present

## 2020-11-07 DIAGNOSIS — R001 Bradycardia, unspecified: Secondary | ICD-10-CM | POA: Diagnosis not present

## 2020-11-07 DIAGNOSIS — R9431 Abnormal electrocardiogram [ECG] [EKG]: Secondary | ICD-10-CM | POA: Diagnosis not present

## 2020-11-07 DIAGNOSIS — J329 Chronic sinusitis, unspecified: Secondary | ICD-10-CM | POA: Diagnosis not present

## 2020-11-07 DIAGNOSIS — D62 Acute posthemorrhagic anemia: Secondary | ICD-10-CM | POA: Diagnosis not present

## 2020-11-07 DIAGNOSIS — C410 Malignant neoplasm of bones of skull and face: Secondary | ICD-10-CM | POA: Diagnosis not present

## 2020-11-07 DIAGNOSIS — K122 Cellulitis and abscess of mouth: Secondary | ICD-10-CM | POA: Diagnosis not present

## 2020-11-07 DIAGNOSIS — I10 Essential (primary) hypertension: Secondary | ICD-10-CM | POA: Diagnosis not present

## 2020-11-07 DIAGNOSIS — Z8616 Personal history of COVID-19: Secondary | ICD-10-CM | POA: Diagnosis not present

## 2020-11-07 DIAGNOSIS — E785 Hyperlipidemia, unspecified: Secondary | ICD-10-CM | POA: Diagnosis not present

## 2020-11-07 DIAGNOSIS — Z931 Gastrostomy status: Secondary | ICD-10-CM | POA: Diagnosis not present

## 2020-11-07 DIAGNOSIS — R739 Hyperglycemia, unspecified: Secondary | ICD-10-CM | POA: Diagnosis not present

## 2020-11-07 DIAGNOSIS — G8918 Other acute postprocedural pain: Secondary | ICD-10-CM | POA: Diagnosis not present

## 2020-11-14 ENCOUNTER — Other Ambulatory Visit: Payer: Self-pay

## 2020-11-14 NOTE — Patient Outreach (Signed)
[facility]) Care Management  11/14/2020  [name] OLAZABAL Sr. 06/17/1946 [id number]   Referral Date: 11/14/20 Referral Source: [employer] Report Date of Discharge: 11/13/20 Facility:  [facility] Insurance: [facility]   Referral received. No outreach warranted at this time. Transition of Care will be completed by primary care provider office who will refer to [facility] care management if needed.  Plan: RN CM will close case.   Jone Baseman, RN, MSN [facility] Care Management Coordinator Direct Line [phone] Cell [phone] Toll Free: [phone]  Fax: [phone]

## 2020-11-18 DIAGNOSIS — R1312 Dysphagia, oropharyngeal phase: Secondary | ICD-10-CM | POA: Diagnosis not present

## 2020-11-18 DIAGNOSIS — C44329 Squamous cell carcinoma of skin of other parts of face: Secondary | ICD-10-CM | POA: Diagnosis not present

## 2020-11-18 DIAGNOSIS — R627 Adult failure to thrive: Secondary | ICD-10-CM | POA: Diagnosis not present

## 2020-11-20 DIAGNOSIS — Z87891 Personal history of nicotine dependence: Secondary | ICD-10-CM | POA: Diagnosis not present

## 2020-11-20 DIAGNOSIS — Z9889 Other specified postprocedural states: Secondary | ICD-10-CM | POA: Diagnosis not present

## 2020-11-20 DIAGNOSIS — C050 Malignant neoplasm of hard palate: Secondary | ICD-10-CM | POA: Diagnosis not present

## 2020-11-27 ENCOUNTER — Encounter: Payer: Medicare HMO | Admitting: Family Medicine

## 2020-12-11 DIAGNOSIS — Z602 Problems related to living alone: Secondary | ICD-10-CM | POA: Diagnosis not present

## 2020-12-11 DIAGNOSIS — R1312 Dysphagia, oropharyngeal phase: Secondary | ICD-10-CM | POA: Diagnosis not present

## 2020-12-11 DIAGNOSIS — Z8522 Personal history of malignant neoplasm of nasal cavities, middle ear, and accessory sinuses: Secondary | ICD-10-CM | POA: Diagnosis not present

## 2020-12-11 DIAGNOSIS — R633 Feeding difficulties, unspecified: Secondary | ICD-10-CM | POA: Diagnosis not present

## 2020-12-11 DIAGNOSIS — C050 Malignant neoplasm of hard palate: Secondary | ICD-10-CM | POA: Diagnosis not present

## 2020-12-11 DIAGNOSIS — R933 Abnormal findings on diagnostic imaging of other parts of digestive tract: Secondary | ICD-10-CM | POA: Diagnosis not present

## 2020-12-11 DIAGNOSIS — Z923 Personal history of irradiation: Secondary | ICD-10-CM | POA: Diagnosis not present

## 2020-12-11 DIAGNOSIS — Z9889 Other specified postprocedural states: Secondary | ICD-10-CM | POA: Diagnosis not present

## 2020-12-11 DIAGNOSIS — Z48815 Encounter for surgical aftercare following surgery on the digestive system: Secondary | ICD-10-CM | POA: Diagnosis not present

## 2020-12-11 DIAGNOSIS — Z85828 Personal history of other malignant neoplasm of skin: Secondary | ICD-10-CM | POA: Diagnosis not present

## 2020-12-18 DIAGNOSIS — R1312 Dysphagia, oropharyngeal phase: Secondary | ICD-10-CM | POA: Diagnosis not present

## 2020-12-18 DIAGNOSIS — R627 Adult failure to thrive: Secondary | ICD-10-CM | POA: Diagnosis not present

## 2020-12-18 DIAGNOSIS — C44329 Squamous cell carcinoma of skin of other parts of face: Secondary | ICD-10-CM | POA: Diagnosis not present

## 2020-12-19 DIAGNOSIS — R1312 Dysphagia, oropharyngeal phase: Secondary | ICD-10-CM | POA: Diagnosis not present

## 2020-12-26 DIAGNOSIS — R1312 Dysphagia, oropharyngeal phase: Secondary | ICD-10-CM | POA: Diagnosis not present

## 2021-01-02 DIAGNOSIS — R1312 Dysphagia, oropharyngeal phase: Secondary | ICD-10-CM | POA: Diagnosis not present

## 2021-01-07 DIAGNOSIS — R1312 Dysphagia, oropharyngeal phase: Secondary | ICD-10-CM | POA: Diagnosis not present

## 2021-01-08 DIAGNOSIS — Z923 Personal history of irradiation: Secondary | ICD-10-CM | POA: Diagnosis not present

## 2021-01-08 DIAGNOSIS — Z8522 Personal history of malignant neoplasm of nasal cavities, middle ear, and accessory sinuses: Secondary | ICD-10-CM | POA: Diagnosis not present

## 2021-01-08 DIAGNOSIS — J3489 Other specified disorders of nose and nasal sinuses: Secondary | ICD-10-CM | POA: Diagnosis not present

## 2021-01-08 DIAGNOSIS — Z9889 Other specified postprocedural states: Secondary | ICD-10-CM | POA: Diagnosis not present

## 2021-01-08 DIAGNOSIS — K1379 Other lesions of oral mucosa: Secondary | ICD-10-CM | POA: Diagnosis not present

## 2021-01-08 DIAGNOSIS — Z87891 Personal history of nicotine dependence: Secondary | ICD-10-CM | POA: Diagnosis not present

## 2021-01-08 DIAGNOSIS — K068 Other specified disorders of gingiva and edentulous alveolar ridge: Secondary | ICD-10-CM | POA: Diagnosis not present

## 2021-01-20 DIAGNOSIS — R627 Adult failure to thrive: Secondary | ICD-10-CM | POA: Diagnosis not present

## 2021-01-20 DIAGNOSIS — R1312 Dysphagia, oropharyngeal phase: Secondary | ICD-10-CM | POA: Diagnosis not present

## 2021-01-20 DIAGNOSIS — C44329 Squamous cell carcinoma of skin of other parts of face: Secondary | ICD-10-CM | POA: Diagnosis not present

## 2021-01-27 DIAGNOSIS — Z87891 Personal history of nicotine dependence: Secondary | ICD-10-CM | POA: Diagnosis not present

## 2021-01-27 DIAGNOSIS — Z08 Encounter for follow-up examination after completed treatment for malignant neoplasm: Secondary | ICD-10-CM | POA: Diagnosis not present

## 2021-01-27 DIAGNOSIS — Z923 Personal history of irradiation: Secondary | ICD-10-CM | POA: Diagnosis not present

## 2021-01-27 DIAGNOSIS — Z85828 Personal history of other malignant neoplasm of skin: Secondary | ICD-10-CM | POA: Diagnosis not present

## 2021-01-27 DIAGNOSIS — C4402 Squamous cell carcinoma of skin of lip: Secondary | ICD-10-CM | POA: Diagnosis not present

## 2021-01-27 DIAGNOSIS — Z888 Allergy status to other drugs, medicaments and biological substances status: Secondary | ICD-10-CM | POA: Diagnosis not present

## 2021-01-27 DIAGNOSIS — K219 Gastro-esophageal reflux disease without esophagitis: Secondary | ICD-10-CM | POA: Diagnosis not present

## 2021-01-27 DIAGNOSIS — Z8522 Personal history of malignant neoplasm of nasal cavities, middle ear, and accessory sinuses: Secondary | ICD-10-CM | POA: Diagnosis not present

## 2021-01-27 DIAGNOSIS — Z951 Presence of aortocoronary bypass graft: Secondary | ICD-10-CM | POA: Diagnosis not present

## 2021-01-27 DIAGNOSIS — Z7982 Long term (current) use of aspirin: Secondary | ICD-10-CM | POA: Diagnosis not present

## 2021-01-28 DIAGNOSIS — E119 Type 2 diabetes mellitus without complications: Secondary | ICD-10-CM | POA: Diagnosis not present

## 2021-01-28 DIAGNOSIS — C9000 Multiple myeloma not having achieved remission: Secondary | ICD-10-CM | POA: Diagnosis not present

## 2021-02-03 ENCOUNTER — Encounter: Payer: Self-pay | Admitting: Family Medicine

## 2021-02-05 DIAGNOSIS — R1310 Dysphagia, unspecified: Secondary | ICD-10-CM | POA: Diagnosis not present

## 2021-02-05 DIAGNOSIS — R1312 Dysphagia, oropharyngeal phase: Secondary | ICD-10-CM | POA: Diagnosis not present

## 2021-02-05 DIAGNOSIS — R633 Feeding difficulties, unspecified: Secondary | ICD-10-CM | POA: Diagnosis not present

## 2021-02-14 DIAGNOSIS — R1312 Dysphagia, oropharyngeal phase: Secondary | ICD-10-CM | POA: Diagnosis not present

## 2021-02-14 DIAGNOSIS — R627 Adult failure to thrive: Secondary | ICD-10-CM | POA: Diagnosis not present

## 2021-02-14 DIAGNOSIS — C44329 Squamous cell carcinoma of skin of other parts of face: Secondary | ICD-10-CM | POA: Diagnosis not present

## 2021-03-04 DIAGNOSIS — Q359 Cleft palate, unspecified: Secondary | ICD-10-CM | POA: Diagnosis not present

## 2021-03-04 DIAGNOSIS — C300 Malignant neoplasm of nasal cavity: Secondary | ICD-10-CM | POA: Diagnosis not present

## 2021-03-04 DIAGNOSIS — C050 Malignant neoplasm of hard palate: Secondary | ICD-10-CM | POA: Diagnosis not present

## 2021-03-18 DIAGNOSIS — R1312 Dysphagia, oropharyngeal phase: Secondary | ICD-10-CM | POA: Diagnosis not present

## 2021-03-18 DIAGNOSIS — R627 Adult failure to thrive: Secondary | ICD-10-CM | POA: Diagnosis not present

## 2021-03-18 DIAGNOSIS — C44329 Squamous cell carcinoma of skin of other parts of face: Secondary | ICD-10-CM | POA: Diagnosis not present

## 2021-04-16 DIAGNOSIS — R627 Adult failure to thrive: Secondary | ICD-10-CM | POA: Diagnosis not present

## 2021-04-16 DIAGNOSIS — C44329 Squamous cell carcinoma of skin of other parts of face: Secondary | ICD-10-CM | POA: Diagnosis not present

## 2021-04-16 DIAGNOSIS — R1312 Dysphagia, oropharyngeal phase: Secondary | ICD-10-CM | POA: Diagnosis not present

## 2021-05-15 DIAGNOSIS — C44329 Squamous cell carcinoma of skin of other parts of face: Secondary | ICD-10-CM | POA: Diagnosis not present

## 2021-05-15 DIAGNOSIS — R1312 Dysphagia, oropharyngeal phase: Secondary | ICD-10-CM | POA: Diagnosis not present

## 2021-05-15 DIAGNOSIS — R627 Adult failure to thrive: Secondary | ICD-10-CM | POA: Diagnosis not present

## 2021-06-18 DIAGNOSIS — C44329 Squamous cell carcinoma of skin of other parts of face: Secondary | ICD-10-CM | POA: Diagnosis not present

## 2021-06-18 DIAGNOSIS — R1312 Dysphagia, oropharyngeal phase: Secondary | ICD-10-CM | POA: Diagnosis not present

## 2021-06-18 DIAGNOSIS — R627 Adult failure to thrive: Secondary | ICD-10-CM | POA: Diagnosis not present

## 2021-07-01 DIAGNOSIS — Q359 Cleft palate, unspecified: Secondary | ICD-10-CM | POA: Diagnosis not present

## 2021-07-01 DIAGNOSIS — H2513 Age-related nuclear cataract, bilateral: Secondary | ICD-10-CM | POA: Diagnosis not present

## 2021-07-01 DIAGNOSIS — H25043 Posterior subcapsular polar age-related cataract, bilateral: Secondary | ICD-10-CM | POA: Diagnosis not present

## 2021-07-01 DIAGNOSIS — C050 Malignant neoplasm of hard palate: Secondary | ICD-10-CM | POA: Diagnosis not present

## 2021-07-01 DIAGNOSIS — H25013 Cortical age-related cataract, bilateral: Secondary | ICD-10-CM | POA: Diagnosis not present

## 2021-07-02 DIAGNOSIS — Z0100 Encounter for examination of eyes and vision without abnormal findings: Secondary | ICD-10-CM | POA: Diagnosis not present

## 2021-07-28 ENCOUNTER — Other Ambulatory Visit: Payer: Self-pay | Admitting: Cardiovascular Disease

## 2021-08-26 DIAGNOSIS — R1312 Dysphagia, oropharyngeal phase: Secondary | ICD-10-CM | POA: Diagnosis not present

## 2021-08-26 DIAGNOSIS — U071 COVID-19: Secondary | ICD-10-CM | POA: Diagnosis not present

## 2021-08-26 DIAGNOSIS — C44329 Squamous cell carcinoma of skin of other parts of face: Secondary | ICD-10-CM | POA: Diagnosis not present

## 2021-08-26 DIAGNOSIS — R627 Adult failure to thrive: Secondary | ICD-10-CM | POA: Diagnosis not present

## 2021-09-23 DIAGNOSIS — I10 Essential (primary) hypertension: Secondary | ICD-10-CM | POA: Diagnosis not present

## 2021-09-23 DIAGNOSIS — K9423 Gastrostomy malfunction: Secondary | ICD-10-CM | POA: Diagnosis not present

## 2021-09-23 DIAGNOSIS — T85598A Other mechanical complication of other gastrointestinal prosthetic devices, implants and grafts, initial encounter: Secondary | ICD-10-CM | POA: Diagnosis not present

## 2021-09-23 DIAGNOSIS — K219 Gastro-esophageal reflux disease without esophagitis: Secondary | ICD-10-CM | POA: Diagnosis not present

## 2021-09-23 DIAGNOSIS — Z87891 Personal history of nicotine dependence: Secondary | ICD-10-CM | POA: Diagnosis not present

## 2021-09-23 DIAGNOSIS — I2510 Atherosclerotic heart disease of native coronary artery without angina pectoris: Secondary | ICD-10-CM | POA: Diagnosis not present

## 2021-09-26 DIAGNOSIS — Z888 Allergy status to other drugs, medicaments and biological substances status: Secondary | ICD-10-CM | POA: Diagnosis not present

## 2021-09-26 DIAGNOSIS — T85638A Leakage of other specified internal prosthetic devices, implants and grafts, initial encounter: Secondary | ICD-10-CM | POA: Diagnosis not present

## 2021-09-26 DIAGNOSIS — L539 Erythematous condition, unspecified: Secondary | ICD-10-CM | POA: Diagnosis not present

## 2021-09-26 DIAGNOSIS — Z951 Presence of aortocoronary bypass graft: Secondary | ICD-10-CM | POA: Diagnosis not present

## 2021-09-26 DIAGNOSIS — K219 Gastro-esophageal reflux disease without esophagitis: Secondary | ICD-10-CM | POA: Diagnosis not present

## 2021-09-26 DIAGNOSIS — Z792 Long term (current) use of antibiotics: Secondary | ICD-10-CM | POA: Diagnosis not present

## 2021-09-26 DIAGNOSIS — K9423 Gastrostomy malfunction: Secondary | ICD-10-CM | POA: Diagnosis not present

## 2021-09-26 DIAGNOSIS — I10 Essential (primary) hypertension: Secondary | ICD-10-CM | POA: Diagnosis not present

## 2021-09-26 DIAGNOSIS — Z87891 Personal history of nicotine dependence: Secondary | ICD-10-CM | POA: Diagnosis not present

## 2021-10-01 DIAGNOSIS — R1310 Dysphagia, unspecified: Secondary | ICD-10-CM | POA: Diagnosis not present

## 2021-10-01 DIAGNOSIS — R633 Feeding difficulties, unspecified: Secondary | ICD-10-CM | POA: Diagnosis not present

## 2021-10-01 DIAGNOSIS — R1312 Dysphagia, oropharyngeal phase: Secondary | ICD-10-CM | POA: Diagnosis not present

## 2021-10-02 IMAGING — DX DG CHEST 2V
2 series · 2 of 2 positions shown · non-contrast
Comparison: 01/29/2004

CLINICAL DATA: Evaluate for hyperbaric oxygen therapy

EXAM:
CHEST - 2 VIEW

[chest pa]
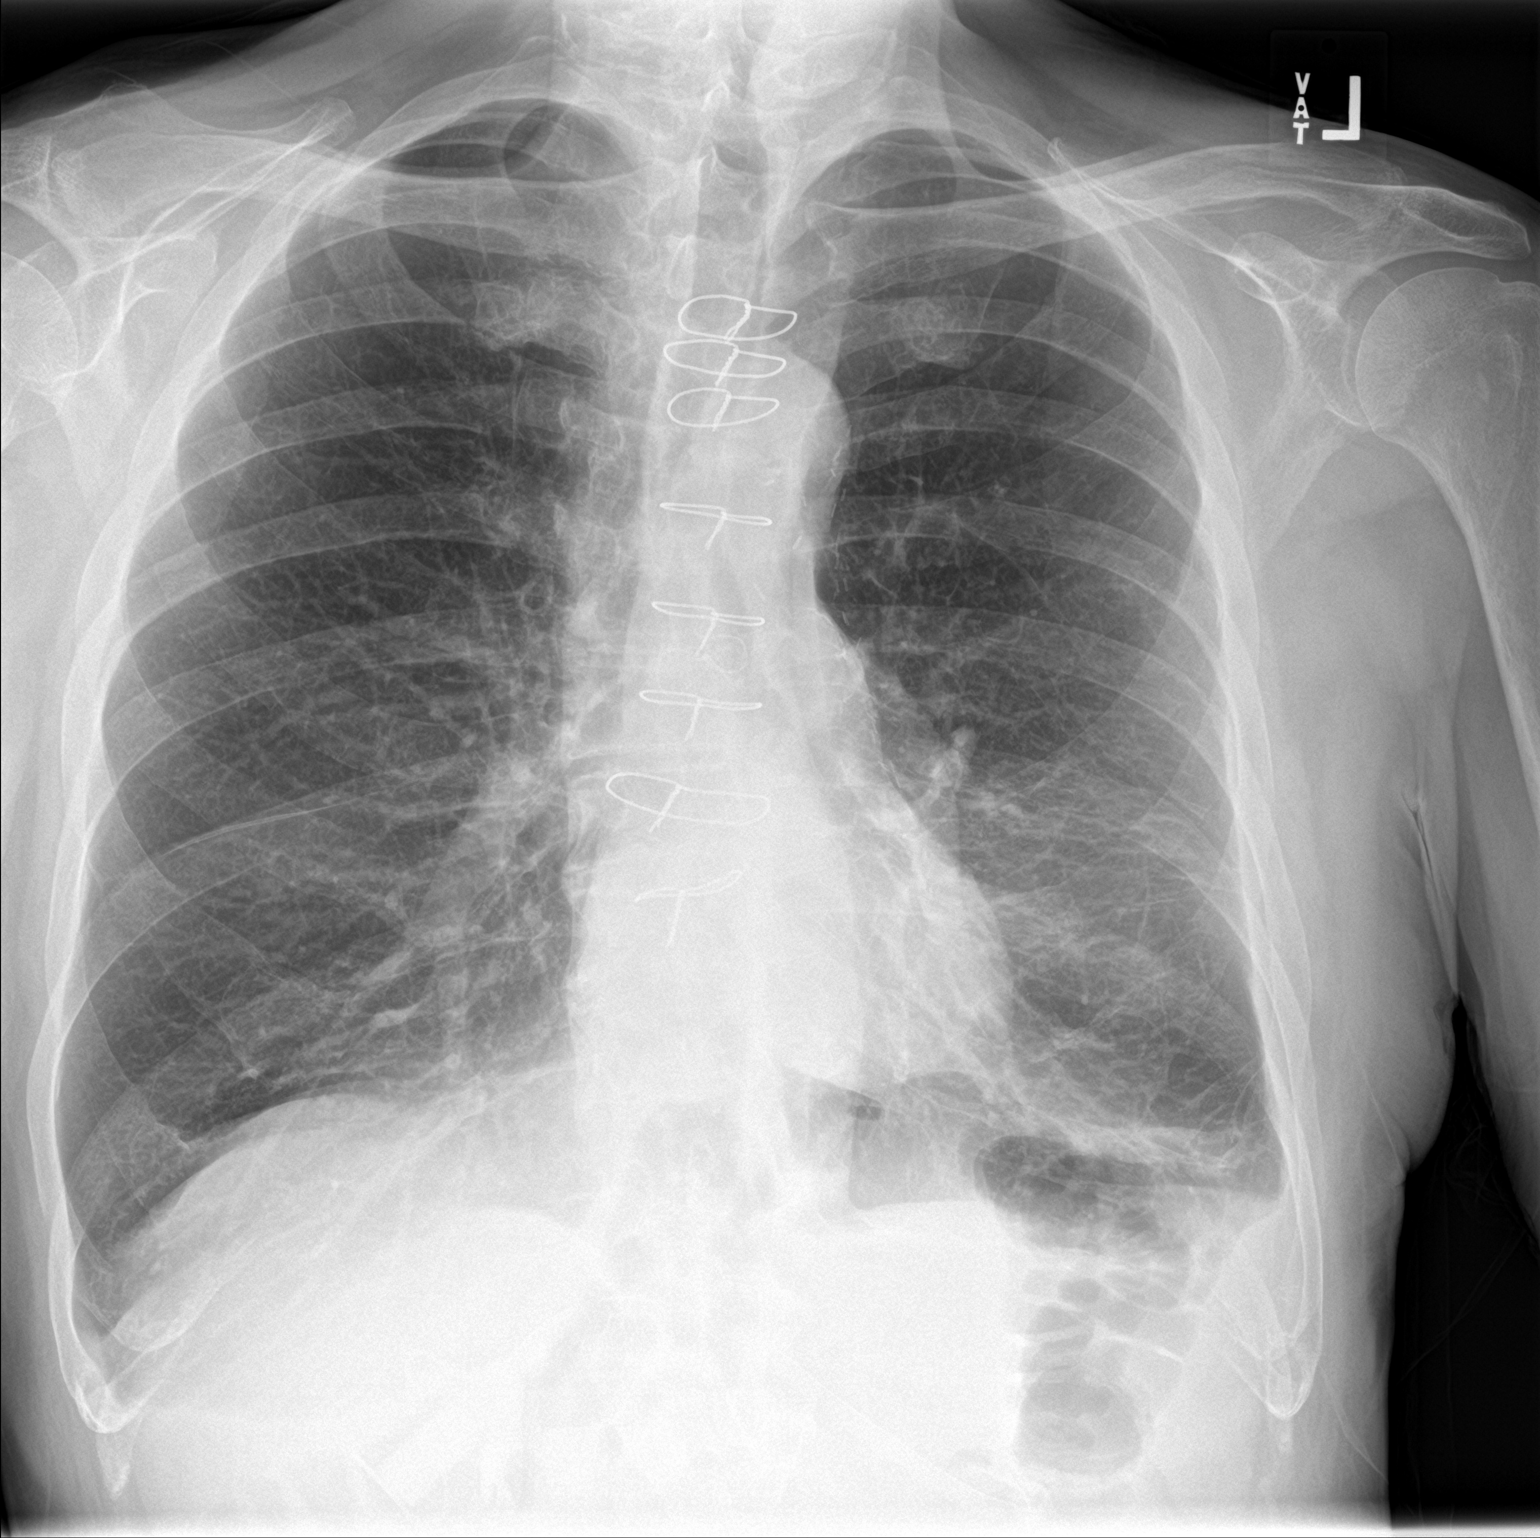

[chest lat]
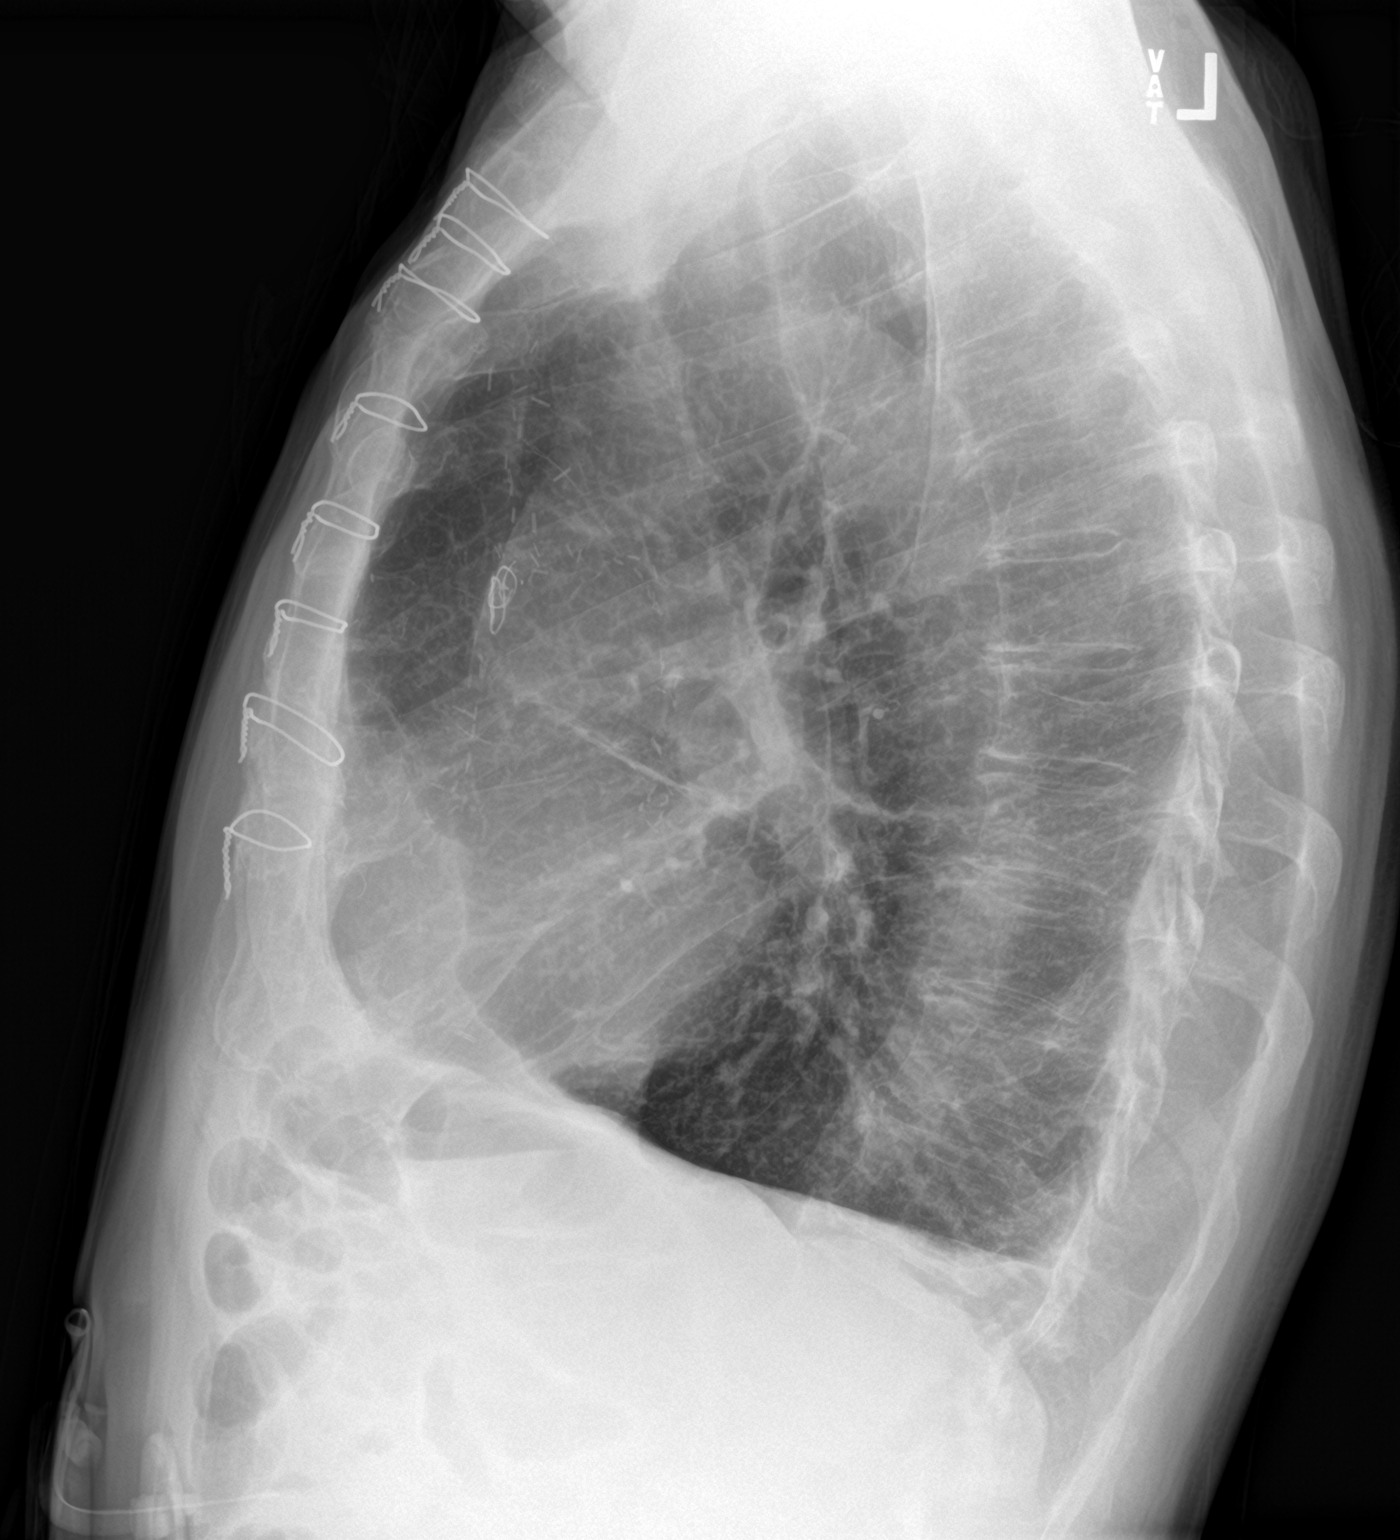

[2 of 2 positions shown; findings below may reference images not displayed]

FINDINGS: Status post median sternotomy and CABG. Pulmonary hyperinflation.
Unchanged bandlike scarring of the left lung base. The visualized
skeletal structures are unremarkable.
IMPRESSION: Pulmonary hyperinflation and unchanged bandlike scarring of the left
lung base. No acute appearing airspace opacity.

## 2021-10-09 DIAGNOSIS — R1312 Dysphagia, oropharyngeal phase: Secondary | ICD-10-CM | POA: Diagnosis not present

## 2021-10-28 DIAGNOSIS — Z4889 Encounter for other specified surgical aftercare: Secondary | ICD-10-CM | POA: Diagnosis not present

## 2021-10-28 DIAGNOSIS — R633 Feeding difficulties, unspecified: Secondary | ICD-10-CM | POA: Diagnosis not present

## 2021-10-28 DIAGNOSIS — I10 Essential (primary) hypertension: Secondary | ICD-10-CM | POA: Diagnosis not present

## 2021-10-28 DIAGNOSIS — F17200 Nicotine dependence, unspecified, uncomplicated: Secondary | ICD-10-CM | POA: Diagnosis not present

## 2021-10-28 DIAGNOSIS — Q359 Cleft palate, unspecified: Secondary | ICD-10-CM | POA: Diagnosis not present

## 2021-10-28 DIAGNOSIS — K219 Gastro-esophageal reflux disease without esophagitis: Secondary | ICD-10-CM | POA: Diagnosis not present

## 2021-10-28 DIAGNOSIS — I2510 Atherosclerotic heart disease of native coronary artery without angina pectoris: Secondary | ICD-10-CM | POA: Diagnosis not present

## 2022-01-14 DIAGNOSIS — R1312 Dysphagia, oropharyngeal phase: Secondary | ICD-10-CM | POA: Diagnosis not present

## 2022-01-14 DIAGNOSIS — R633 Feeding difficulties, unspecified: Secondary | ICD-10-CM | POA: Diagnosis not present

## 2022-01-14 DIAGNOSIS — R1310 Dysphagia, unspecified: Secondary | ICD-10-CM | POA: Diagnosis not present

## 2022-03-10 DIAGNOSIS — Z9889 Other specified postprocedural states: Secondary | ICD-10-CM | POA: Diagnosis not present

## 2022-03-10 DIAGNOSIS — Q359 Cleft palate, unspecified: Secondary | ICD-10-CM | POA: Diagnosis not present

## 2022-03-10 DIAGNOSIS — R479 Unspecified speech disturbances: Secondary | ICD-10-CM | POA: Diagnosis not present

## 2022-03-10 DIAGNOSIS — R6339 Other feeding difficulties: Secondary | ICD-10-CM | POA: Diagnosis not present

## 2022-05-17 DIAGNOSIS — K219 Gastro-esophageal reflux disease without esophagitis: Secondary | ICD-10-CM | POA: Diagnosis not present

## 2022-05-17 DIAGNOSIS — Z87891 Personal history of nicotine dependence: Secondary | ICD-10-CM | POA: Diagnosis not present

## 2022-05-17 DIAGNOSIS — Z4682 Encounter for fitting and adjustment of non-vascular catheter: Secondary | ICD-10-CM | POA: Diagnosis not present

## 2022-05-17 DIAGNOSIS — Z888 Allergy status to other drugs, medicaments and biological substances status: Secondary | ICD-10-CM | POA: Diagnosis not present

## 2022-05-17 DIAGNOSIS — I10 Essential (primary) hypertension: Secondary | ICD-10-CM | POA: Diagnosis not present

## 2022-05-17 DIAGNOSIS — Z79899 Other long term (current) drug therapy: Secondary | ICD-10-CM | POA: Diagnosis not present

## 2022-05-17 DIAGNOSIS — Z951 Presence of aortocoronary bypass graft: Secondary | ICD-10-CM | POA: Diagnosis not present

## 2022-05-17 DIAGNOSIS — K9423 Gastrostomy malfunction: Secondary | ICD-10-CM | POA: Diagnosis not present

## 2022-05-17 DIAGNOSIS — I25810 Atherosclerosis of coronary artery bypass graft(s) without angina pectoris: Secondary | ICD-10-CM | POA: Diagnosis not present

## 2022-05-17 DIAGNOSIS — Z7982 Long term (current) use of aspirin: Secondary | ICD-10-CM | POA: Diagnosis not present

## 2022-07-02 DIAGNOSIS — I770 Arteriovenous fistula, acquired: Secondary | ICD-10-CM | POA: Diagnosis not present

## 2022-08-12 DIAGNOSIS — R627 Adult failure to thrive: Secondary | ICD-10-CM | POA: Diagnosis not present

## 2022-08-12 DIAGNOSIS — C44329 Squamous cell carcinoma of skin of other parts of face: Secondary | ICD-10-CM | POA: Diagnosis not present

## 2022-08-12 DIAGNOSIS — R1312 Dysphagia, oropharyngeal phase: Secondary | ICD-10-CM | POA: Diagnosis not present

## 2022-08-18 DIAGNOSIS — R633 Feeding difficulties, unspecified: Secondary | ICD-10-CM | POA: Diagnosis not present

## 2022-08-18 DIAGNOSIS — R4921 Hypernasality: Secondary | ICD-10-CM | POA: Diagnosis not present

## 2022-08-18 DIAGNOSIS — Q359 Cleft palate, unspecified: Secondary | ICD-10-CM | POA: Diagnosis not present

## 2022-08-18 DIAGNOSIS — J3489 Other specified disorders of nose and nasal sinuses: Secondary | ICD-10-CM | POA: Diagnosis not present

## 2022-08-18 DIAGNOSIS — Z87891 Personal history of nicotine dependence: Secondary | ICD-10-CM | POA: Diagnosis not present

## 2022-09-02 ENCOUNTER — Other Ambulatory Visit: Payer: Self-pay | Admitting: Cardiovascular Disease

## 2022-09-10 DIAGNOSIS — R1312 Dysphagia, oropharyngeal phase: Secondary | ICD-10-CM | POA: Diagnosis not present

## 2022-09-10 DIAGNOSIS — C44329 Squamous cell carcinoma of skin of other parts of face: Secondary | ICD-10-CM | POA: Diagnosis not present

## 2022-09-10 DIAGNOSIS — R627 Adult failure to thrive: Secondary | ICD-10-CM | POA: Diagnosis not present

## 2022-10-12 DIAGNOSIS — R1312 Dysphagia, oropharyngeal phase: Secondary | ICD-10-CM | POA: Diagnosis not present

## 2022-10-12 DIAGNOSIS — R627 Adult failure to thrive: Secondary | ICD-10-CM | POA: Diagnosis not present

## 2022-10-12 DIAGNOSIS — C44329 Squamous cell carcinoma of skin of other parts of face: Secondary | ICD-10-CM | POA: Diagnosis not present

## 2022-11-10 DIAGNOSIS — R1312 Dysphagia, oropharyngeal phase: Secondary | ICD-10-CM | POA: Diagnosis not present

## 2022-11-10 DIAGNOSIS — C44329 Squamous cell carcinoma of skin of other parts of face: Secondary | ICD-10-CM | POA: Diagnosis not present

## 2022-11-10 DIAGNOSIS — R627 Adult failure to thrive: Secondary | ICD-10-CM | POA: Diagnosis not present

## 2022-11-11 IMAGING — CR DG CHEST 2V
2 series · 2 of 2 positions shown · non-contrast
Comparison: 05/23/2020.  06/13/2019.

CLINICAL DATA: Follow-up coronavirus pneumonia.

EXAM:
CHEST - 2 VIEW

[w chest pa]
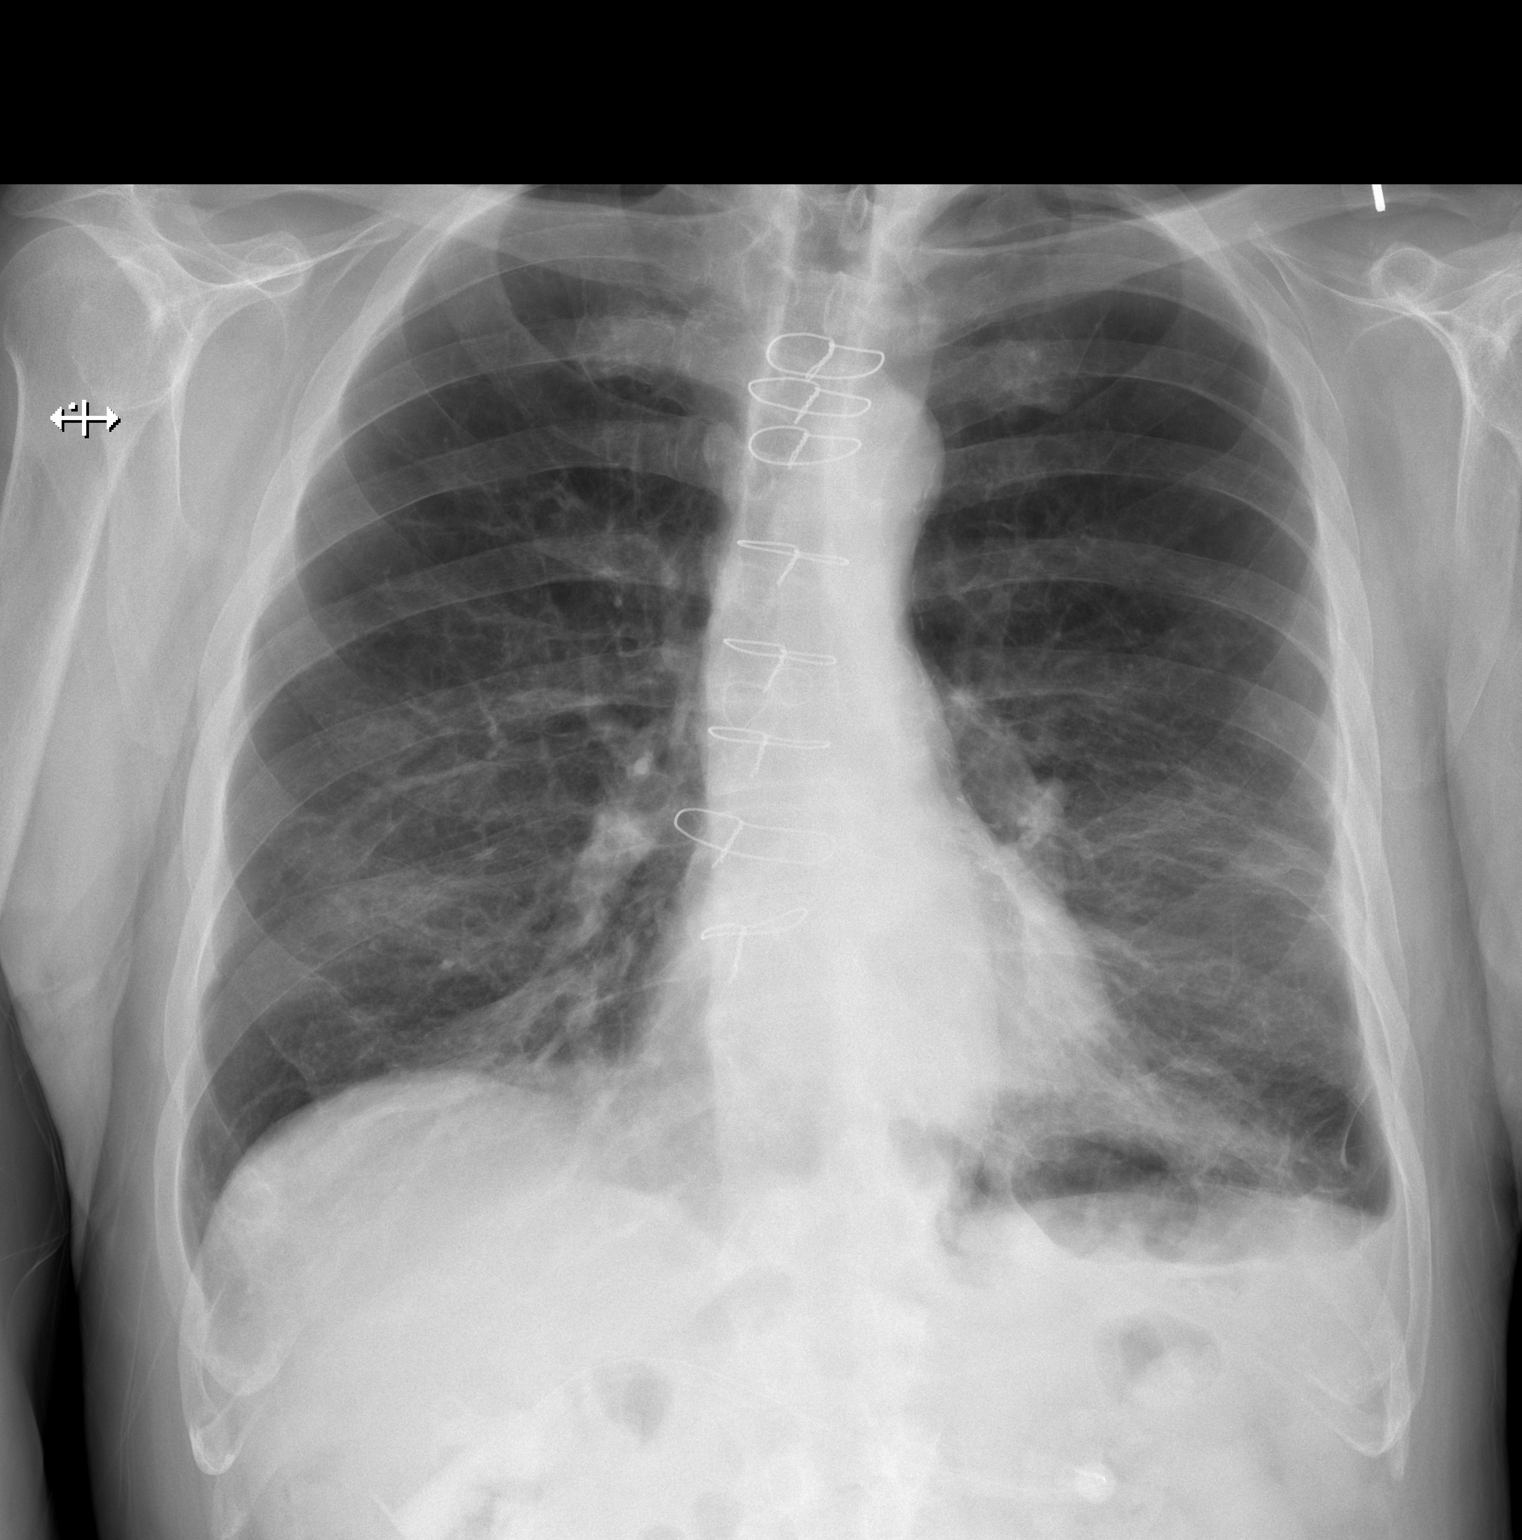

[w chest lat]
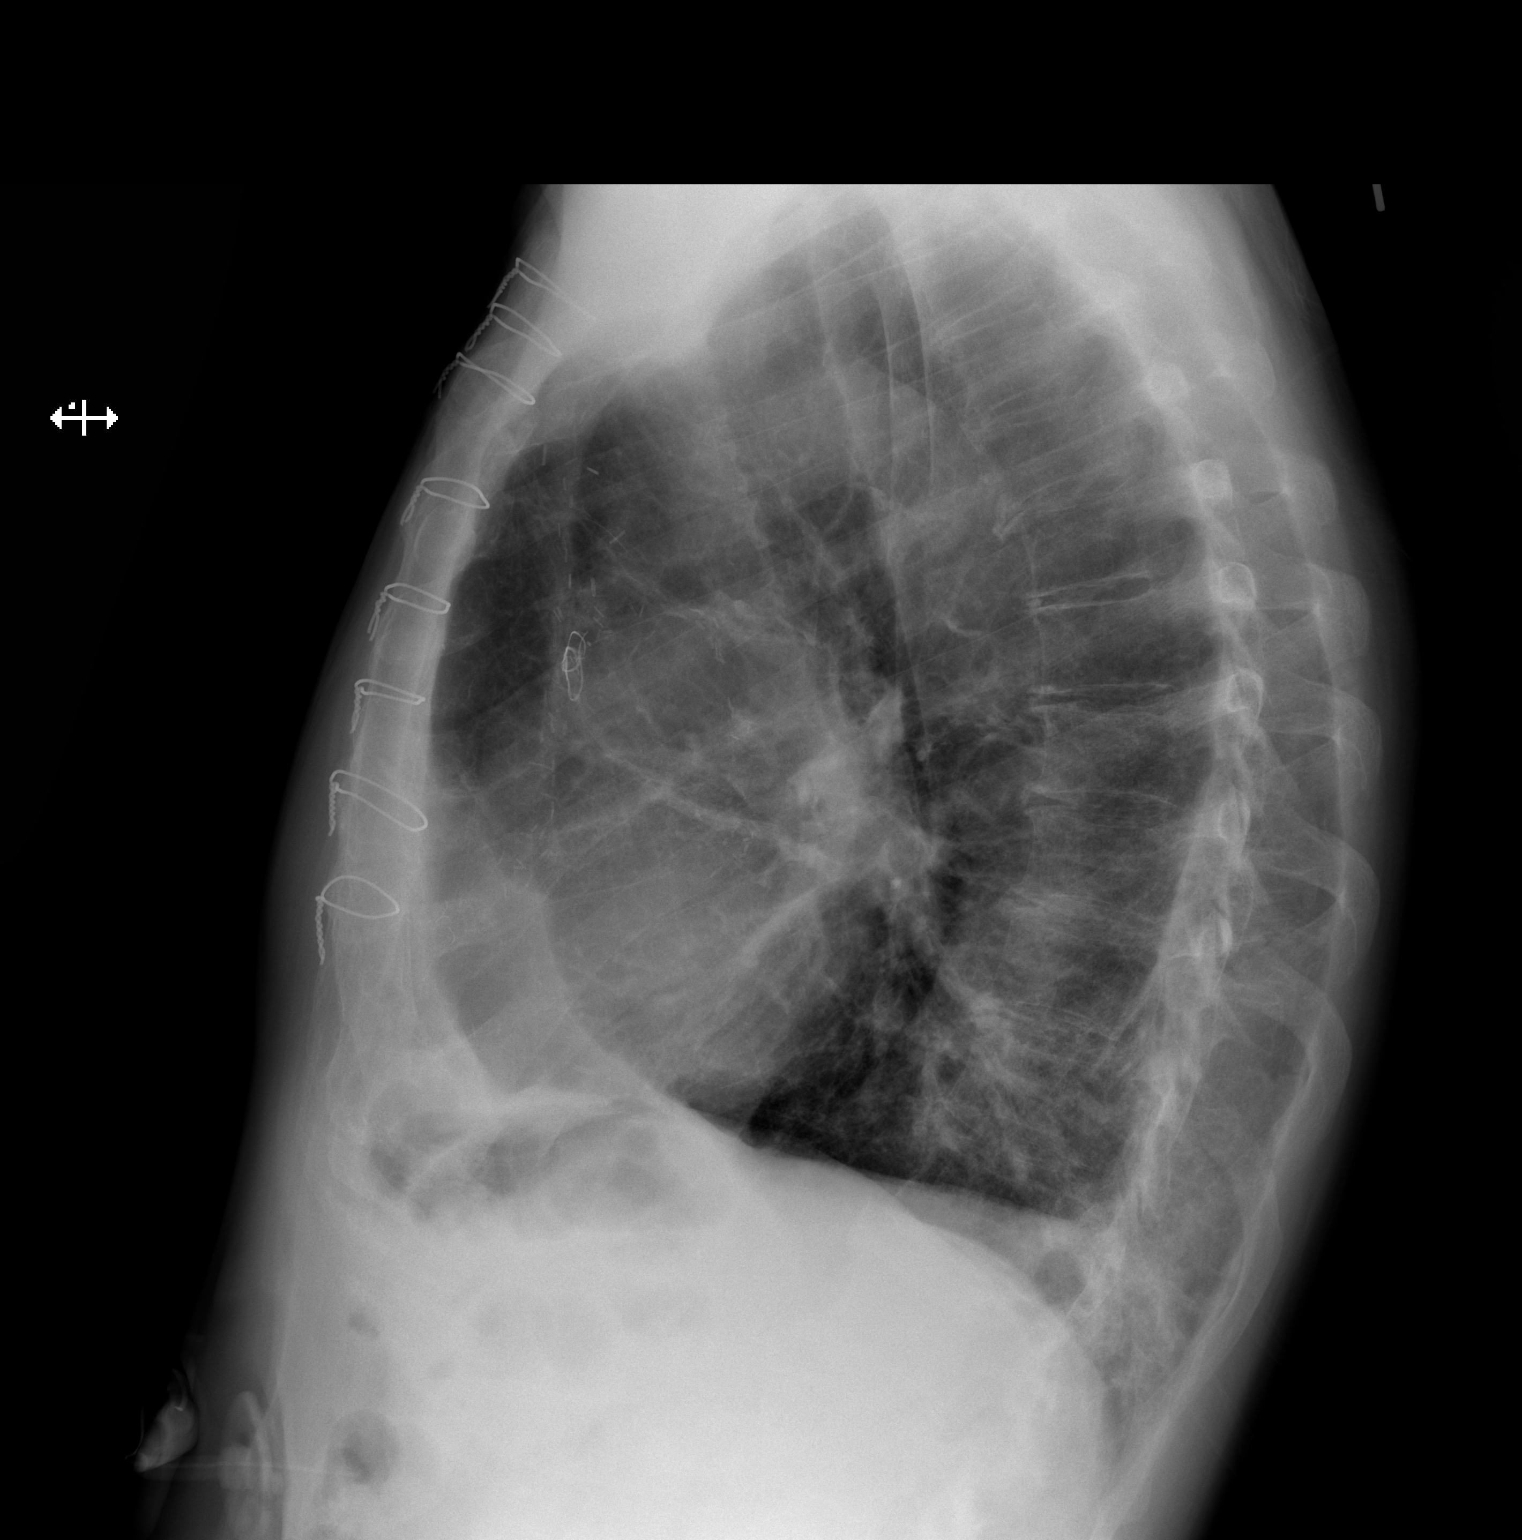

[2 of 2 positions shown; findings below may reference images not displayed]

FINDINGS: Previous median sternotomy. Heart size is normal. Ordinary aortic
atherosclerotic calcification is seen. Chronic findings of emphysema
and pulmonary scarring appear very similar to the presumed baseline
study of 06/13/2019. There may be slight progression of scarring
particularly at the lung bases. No finding to suggest ongoing
pneumonia.
IMPRESSION: Chronic findings of emphysema and pulmonary scarring. Findings very
close to the presumed baseline study May 2019. Question
slight progression of scarring at the lung bases. No evidence of
ongoing pneumonia.

## 2022-12-01 DIAGNOSIS — Q359 Cleft palate, unspecified: Secondary | ICD-10-CM | POA: Diagnosis not present

## 2022-12-09 DIAGNOSIS — C44329 Squamous cell carcinoma of skin of other parts of face: Secondary | ICD-10-CM | POA: Diagnosis not present

## 2022-12-09 DIAGNOSIS — R627 Adult failure to thrive: Secondary | ICD-10-CM | POA: Diagnosis not present

## 2022-12-09 DIAGNOSIS — R1312 Dysphagia, oropharyngeal phase: Secondary | ICD-10-CM | POA: Diagnosis not present

## 2022-12-24 DIAGNOSIS — Z431 Encounter for attention to gastrostomy: Secondary | ICD-10-CM | POA: Diagnosis not present

## 2022-12-24 DIAGNOSIS — I10 Essential (primary) hypertension: Secondary | ICD-10-CM | POA: Diagnosis not present

## 2022-12-24 DIAGNOSIS — Z87891 Personal history of nicotine dependence: Secondary | ICD-10-CM | POA: Diagnosis not present

## 2022-12-24 DIAGNOSIS — Z4682 Encounter for fitting and adjustment of non-vascular catheter: Secondary | ICD-10-CM | POA: Diagnosis not present

## 2022-12-24 DIAGNOSIS — K9423 Gastrostomy malfunction: Secondary | ICD-10-CM | POA: Diagnosis not present

## 2023-01-08 DIAGNOSIS — R627 Adult failure to thrive: Secondary | ICD-10-CM | POA: Diagnosis not present

## 2023-01-08 DIAGNOSIS — C44329 Squamous cell carcinoma of skin of other parts of face: Secondary | ICD-10-CM | POA: Diagnosis not present

## 2023-01-08 DIAGNOSIS — R1312 Dysphagia, oropharyngeal phase: Secondary | ICD-10-CM | POA: Diagnosis not present

## 2023-02-08 DIAGNOSIS — C44329 Squamous cell carcinoma of skin of other parts of face: Secondary | ICD-10-CM | POA: Diagnosis not present

## 2023-02-08 DIAGNOSIS — R1312 Dysphagia, oropharyngeal phase: Secondary | ICD-10-CM | POA: Diagnosis not present

## 2023-02-08 DIAGNOSIS — R627 Adult failure to thrive: Secondary | ICD-10-CM | POA: Diagnosis not present

## 2023-03-08 DIAGNOSIS — R1312 Dysphagia, oropharyngeal phase: Secondary | ICD-10-CM | POA: Diagnosis not present

## 2023-03-08 DIAGNOSIS — R627 Adult failure to thrive: Secondary | ICD-10-CM | POA: Diagnosis not present

## 2023-03-08 DIAGNOSIS — C44329 Squamous cell carcinoma of skin of other parts of face: Secondary | ICD-10-CM | POA: Diagnosis not present

## 2023-03-23 DIAGNOSIS — C050 Malignant neoplasm of hard palate: Secondary | ICD-10-CM | POA: Diagnosis not present

## 2023-04-06 DIAGNOSIS — C44329 Squamous cell carcinoma of skin of other parts of face: Secondary | ICD-10-CM | POA: Diagnosis not present

## 2023-04-06 DIAGNOSIS — R627 Adult failure to thrive: Secondary | ICD-10-CM | POA: Diagnosis not present

## 2023-04-06 DIAGNOSIS — R1312 Dysphagia, oropharyngeal phase: Secondary | ICD-10-CM | POA: Diagnosis not present

## 2023-05-10 DIAGNOSIS — R627 Adult failure to thrive: Secondary | ICD-10-CM | POA: Diagnosis not present

## 2023-05-10 DIAGNOSIS — C44329 Squamous cell carcinoma of skin of other parts of face: Secondary | ICD-10-CM | POA: Diagnosis not present

## 2023-05-10 DIAGNOSIS — R1312 Dysphagia, oropharyngeal phase: Secondary | ICD-10-CM | POA: Diagnosis not present

## 2023-06-09 DIAGNOSIS — C44329 Squamous cell carcinoma of skin of other parts of face: Secondary | ICD-10-CM | POA: Diagnosis not present

## 2023-07-05 ENCOUNTER — Other Ambulatory Visit: Payer: Self-pay | Admitting: Cardiovascular Disease

## 2023-07-08 DIAGNOSIS — C44329 Squamous cell carcinoma of skin of other parts of face: Secondary | ICD-10-CM | POA: Diagnosis not present

## 2023-07-10 DIAGNOSIS — C44329 Squamous cell carcinoma of skin of other parts of face: Secondary | ICD-10-CM | POA: Diagnosis not present

## 2023-08-02 ENCOUNTER — Other Ambulatory Visit: Payer: Self-pay | Admitting: Cardiovascular Disease

## 2023-08-03 DIAGNOSIS — Q359 Cleft palate, unspecified: Secondary | ICD-10-CM | POA: Diagnosis not present

## 2023-08-03 DIAGNOSIS — C050 Malignant neoplasm of hard palate: Secondary | ICD-10-CM | POA: Diagnosis not present

## 2023-08-06 DIAGNOSIS — C44329 Squamous cell carcinoma of skin of other parts of face: Secondary | ICD-10-CM | POA: Diagnosis not present

## 2023-08-09 DIAGNOSIS — C44329 Squamous cell carcinoma of skin of other parts of face: Secondary | ICD-10-CM | POA: Diagnosis not present

## 2023-08-26 DIAGNOSIS — I2510 Atherosclerotic heart disease of native coronary artery without angina pectoris: Secondary | ICD-10-CM | POA: Diagnosis not present

## 2023-08-26 DIAGNOSIS — Z4682 Encounter for fitting and adjustment of non-vascular catheter: Secondary | ICD-10-CM | POA: Diagnosis not present

## 2023-08-26 DIAGNOSIS — I10 Essential (primary) hypertension: Secondary | ICD-10-CM | POA: Diagnosis not present

## 2023-08-26 DIAGNOSIS — K6389 Other specified diseases of intestine: Secondary | ICD-10-CM | POA: Diagnosis not present

## 2023-08-26 DIAGNOSIS — Q359 Cleft palate, unspecified: Secondary | ICD-10-CM | POA: Diagnosis not present

## 2023-08-26 DIAGNOSIS — J320 Chronic maxillary sinusitis: Secondary | ICD-10-CM | POA: Diagnosis not present

## 2023-08-26 DIAGNOSIS — K219 Gastro-esophageal reflux disease without esophagitis: Secondary | ICD-10-CM | POA: Diagnosis not present

## 2023-08-26 DIAGNOSIS — Z85818 Personal history of malignant neoplasm of other sites of lip, oral cavity, and pharynx: Secondary | ICD-10-CM | POA: Diagnosis not present

## 2023-08-26 DIAGNOSIS — K9423 Gastrostomy malfunction: Secondary | ICD-10-CM | POA: Diagnosis not present

## 2023-09-07 DIAGNOSIS — C44329 Squamous cell carcinoma of skin of other parts of face: Secondary | ICD-10-CM | POA: Diagnosis not present

## 2023-09-15 DIAGNOSIS — Q359 Cleft palate, unspecified: Secondary | ICD-10-CM | POA: Diagnosis not present

## 2023-09-28 DIAGNOSIS — J9602 Acute respiratory failure with hypercapnia: Secondary | ICD-10-CM | POA: Diagnosis not present

## 2023-09-28 DIAGNOSIS — R059 Cough, unspecified: Secondary | ICD-10-CM | POA: Diagnosis not present

## 2023-09-28 DIAGNOSIS — R6521 Severe sepsis with septic shock: Secondary | ICD-10-CM | POA: Diagnosis not present

## 2023-09-28 DIAGNOSIS — J168 Pneumonia due to other specified infectious organisms: Secondary | ICD-10-CM | POA: Diagnosis not present

## 2023-09-28 DIAGNOSIS — J439 Emphysema, unspecified: Secondary | ICD-10-CM | POA: Diagnosis not present

## 2023-09-28 DIAGNOSIS — I959 Hypotension, unspecified: Secondary | ICD-10-CM | POA: Diagnosis not present

## 2023-09-28 DIAGNOSIS — J1000 Influenza due to other identified influenza virus with unspecified type of pneumonia: Secondary | ICD-10-CM | POA: Diagnosis not present

## 2023-09-28 DIAGNOSIS — N179 Acute kidney failure, unspecified: Secondary | ICD-10-CM | POA: Diagnosis not present

## 2023-09-28 DIAGNOSIS — J189 Pneumonia, unspecified organism: Secondary | ICD-10-CM | POA: Diagnosis not present

## 2023-09-28 DIAGNOSIS — Z951 Presence of aortocoronary bypass graft: Secondary | ICD-10-CM | POA: Diagnosis not present

## 2023-09-28 DIAGNOSIS — Z4682 Encounter for fitting and adjustment of non-vascular catheter: Secondary | ICD-10-CM | POA: Diagnosis not present

## 2023-09-28 DIAGNOSIS — J9601 Acute respiratory failure with hypoxia: Secondary | ICD-10-CM | POA: Diagnosis not present

## 2023-09-28 DIAGNOSIS — E870 Hyperosmolality and hypernatremia: Secondary | ICD-10-CM | POA: Diagnosis not present

## 2023-09-28 DIAGNOSIS — A419 Sepsis, unspecified organism: Secondary | ICD-10-CM | POA: Diagnosis not present

## 2023-09-28 DIAGNOSIS — F419 Anxiety disorder, unspecified: Secondary | ICD-10-CM | POA: Diagnosis not present

## 2023-09-28 DIAGNOSIS — R0689 Other abnormalities of breathing: Secondary | ICD-10-CM | POA: Diagnosis not present

## 2023-09-28 DIAGNOSIS — R001 Bradycardia, unspecified: Secondary | ICD-10-CM | POA: Diagnosis not present

## 2023-09-28 DIAGNOSIS — E872 Acidosis, unspecified: Secondary | ICD-10-CM | POA: Diagnosis not present

## 2023-09-28 DIAGNOSIS — R197 Diarrhea, unspecified: Secondary | ICD-10-CM | POA: Diagnosis not present

## 2023-09-28 DIAGNOSIS — R5381 Other malaise: Secondary | ICD-10-CM | POA: Diagnosis not present

## 2023-09-28 DIAGNOSIS — R64 Cachexia: Secondary | ICD-10-CM | POA: Diagnosis not present

## 2023-09-28 DIAGNOSIS — Z515 Encounter for palliative care: Secondary | ICD-10-CM | POA: Diagnosis not present

## 2023-09-28 DIAGNOSIS — R0902 Hypoxemia: Secondary | ICD-10-CM | POA: Diagnosis not present

## 2023-09-28 DIAGNOSIS — K7200 Acute and subacute hepatic failure without coma: Secondary | ICD-10-CM | POA: Diagnosis not present

## 2023-09-28 DIAGNOSIS — N289 Disorder of kidney and ureter, unspecified: Secondary | ICD-10-CM | POA: Diagnosis not present

## 2023-09-28 DIAGNOSIS — I082 Rheumatic disorders of both aortic and tricuspid valves: Secondary | ICD-10-CM | POA: Diagnosis not present

## 2023-09-28 DIAGNOSIS — R52 Pain, unspecified: Secondary | ICD-10-CM | POA: Diagnosis not present

## 2023-09-28 DIAGNOSIS — R451 Restlessness and agitation: Secondary | ICD-10-CM | POA: Diagnosis not present

## 2023-09-28 DIAGNOSIS — J101 Influenza due to other identified influenza virus with other respiratory manifestations: Secondary | ICD-10-CM | POA: Diagnosis not present

## 2023-09-28 DIAGNOSIS — Z7189 Other specified counseling: Secondary | ICD-10-CM | POA: Diagnosis not present

## 2023-09-28 DIAGNOSIS — D703 Neutropenia due to infection: Secondary | ICD-10-CM | POA: Diagnosis not present

## 2023-09-28 DIAGNOSIS — Z681 Body mass index (BMI) 19 or less, adult: Secondary | ICD-10-CM | POA: Diagnosis not present

## 2023-09-28 DIAGNOSIS — R918 Other nonspecific abnormal finding of lung field: Secondary | ICD-10-CM | POA: Diagnosis not present

## 2023-10-16 DEATH — deceased
